# Patient Record
Sex: Male | Born: 1935 | Race: White | Hispanic: No | Marital: Married | Smoking: Former smoker
Health system: Southern US, Community
[De-identification: ages and names within clinical notes are randomized; demographics above are authoritative.]

## PROBLEM LIST (undated history)

## (undated) DIAGNOSIS — G629 Polyneuropathy, unspecified: Secondary | ICD-10-CM

## (undated) DIAGNOSIS — C679 Malignant neoplasm of bladder, unspecified: Secondary | ICD-10-CM

## (undated) DIAGNOSIS — I739 Peripheral vascular disease, unspecified: Secondary | ICD-10-CM

## (undated) DIAGNOSIS — J45909 Unspecified asthma, uncomplicated: Secondary | ICD-10-CM

## (undated) DIAGNOSIS — K449 Diaphragmatic hernia without obstruction or gangrene: Secondary | ICD-10-CM

## (undated) DIAGNOSIS — M25561 Pain in right knee: Secondary | ICD-10-CM

## (undated) DIAGNOSIS — I779 Disorder of arteries and arterioles, unspecified: Secondary | ICD-10-CM

## (undated) DIAGNOSIS — N183 Chronic kidney disease, stage 3 unspecified: Secondary | ICD-10-CM

## (undated) DIAGNOSIS — E663 Overweight: Secondary | ICD-10-CM

## (undated) DIAGNOSIS — I714 Abdominal aortic aneurysm, without rupture, unspecified: Secondary | ICD-10-CM

## (undated) DIAGNOSIS — I10 Essential (primary) hypertension: Secondary | ICD-10-CM

## (undated) DIAGNOSIS — H9191 Unspecified hearing loss, right ear: Secondary | ICD-10-CM

## (undated) DIAGNOSIS — I2510 Atherosclerotic heart disease of native coronary artery without angina pectoris: Secondary | ICD-10-CM

## (undated) DIAGNOSIS — M1990 Unspecified osteoarthritis, unspecified site: Secondary | ICD-10-CM

## (undated) DIAGNOSIS — J449 Chronic obstructive pulmonary disease, unspecified: Secondary | ICD-10-CM

## (undated) DIAGNOSIS — E785 Hyperlipidemia, unspecified: Secondary | ICD-10-CM

## (undated) DIAGNOSIS — Z9989 Dependence on other enabling machines and devices: Principal | ICD-10-CM

## (undated) DIAGNOSIS — N400 Enlarged prostate without lower urinary tract symptoms: Secondary | ICD-10-CM

## (undated) DIAGNOSIS — R609 Edema, unspecified: Secondary | ICD-10-CM

## (undated) DIAGNOSIS — G4733 Obstructive sleep apnea (adult) (pediatric): Secondary | ICD-10-CM

## (undated) DIAGNOSIS — K219 Gastro-esophageal reflux disease without esophagitis: Secondary | ICD-10-CM

## (undated) DIAGNOSIS — I4821 Permanent atrial fibrillation: Secondary | ICD-10-CM

## (undated) HISTORY — PX: JOINT REPLACEMENT: SHX530

## (undated) HISTORY — DX: Obstructive sleep apnea (adult) (pediatric): G47.33

## (undated) HISTORY — DX: Overweight: E66.3

## (undated) HISTORY — PX: COLONOSCOPY: SHX5424

## (undated) HISTORY — DX: Dependence on other enabling machines and devices: Z99.89

## (undated) HISTORY — DX: Hyperlipidemia, unspecified: E78.5

## (undated) HISTORY — DX: Essential (primary) hypertension: I10

## (undated) HISTORY — DX: Atherosclerotic heart disease of native coronary artery without angina pectoris: I25.10

## (undated) HISTORY — DX: Gastro-esophageal reflux disease without esophagitis: K21.9

## (undated) HISTORY — PX: BACK SURGERY: SHX140

## (undated) HISTORY — DX: Unspecified osteoarthritis, unspecified site: M19.90

---

## 2004-12-31 ENCOUNTER — Ambulatory Visit: Payer: Self-pay | Admitting: Internal Medicine

## 2006-11-09 ENCOUNTER — Encounter: Payer: Self-pay | Admitting: Cardiovascular Disease

## 2008-04-27 ENCOUNTER — Ambulatory Visit: Payer: Self-pay | Admitting: Cardiovascular Disease

## 2008-12-04 DIAGNOSIS — E785 Hyperlipidemia, unspecified: Secondary | ICD-10-CM | POA: Insufficient documentation

## 2008-12-04 DIAGNOSIS — I10 Essential (primary) hypertension: Secondary | ICD-10-CM | POA: Insufficient documentation

## 2008-12-04 DIAGNOSIS — E663 Overweight: Secondary | ICD-10-CM

## 2008-12-04 DIAGNOSIS — M1990 Unspecified osteoarthritis, unspecified site: Secondary | ICD-10-CM | POA: Insufficient documentation

## 2008-12-04 DIAGNOSIS — R002 Palpitations: Secondary | ICD-10-CM

## 2008-12-04 DIAGNOSIS — K219 Gastro-esophageal reflux disease without esophagitis: Secondary | ICD-10-CM

## 2009-05-04 ENCOUNTER — Ambulatory Visit: Payer: Self-pay

## 2009-05-04 ENCOUNTER — Encounter: Payer: Self-pay | Admitting: Cardiovascular Disease

## 2009-06-15 ENCOUNTER — Ambulatory Visit: Payer: Self-pay | Admitting: Cardiovascular Disease

## 2009-06-15 DIAGNOSIS — I5042 Chronic combined systolic (congestive) and diastolic (congestive) heart failure: Secondary | ICD-10-CM

## 2010-04-27 ENCOUNTER — Inpatient Hospital Stay (HOSPITAL_COMMUNITY): Admission: RE | Admit: 2010-04-27 | Discharge: 2010-04-29 | Payer: Self-pay | Admitting: Neurosurgery

## 2010-09-07 LAB — CBC
HCT: 41.6 % (ref 39.0–52.0)
MCV: 91.2 fL (ref 78.0–100.0)
Platelets: 232 10*3/uL (ref 150–400)

## 2010-09-07 LAB — BASIC METABOLIC PANEL
BUN: 14 mg/dL (ref 6–23)
CO2: 26 mEq/L (ref 19–32)
Chloride: 107 mEq/L (ref 96–112)
Glucose, Bld: 95 mg/dL (ref 70–99)
Potassium: 5 mEq/L (ref 3.5–5.1)

## 2010-09-07 LAB — SURGICAL PCR SCREEN
MRSA, PCR: NEGATIVE
Staphylococcus aureus: NEGATIVE

## 2010-11-08 NOTE — Assessment & Plan Note (Signed)
[facility] OFFICE NOTE   JAQUANE, BOUGHNER                       MRN:          [id number]  DATE:04/27/2008                            DOB:          11-05-1935    PRIMARY CARE PHYSICIAN:  Gelene Mink, MD   HISTORY OF PRESENT ILLNESS:  Mr. Willeen Cass is a pleasant 75 year old  Caucasian male with past medical history significant for hyperlipidemia,  hypertension, BPH, GERD, and arthritis who is referred to our office  today by Dr. Timoteo Gaul for further evaluation of an abnormal  echocardiogram.  The patient complained to Dr. Timoteo Gaul of occasional  shortness of breath.  Given the patient's risk factors for cardiac  disease, he was sent for an echocardiogram, which showed normal left  ventricular systolic function with mild LVH and mild diastolic  dysfunction.  Otherwise, the left atrium was mildly dilated.  There was  trace mitral regurgitation.  The aortic valve was calcified, but had no  evidence of stenosis.  There were no other significant valvular  abnormalities noted.  The patient tells me that he is limited to one he  can do secondary to arthritis and his back and knees.  He has been  participating in water aerobics and has had no episodes of chest pain,  shortness of breath, palpitations, or dizziness with exercise.  He tells  me that if he drinks caffeine, he is aware of his heart racing.  Otherwise, he denies any dizziness, near syncope, syncope, orthopnea,  PND, or change in very mild dependent lower extremity edema that has  occurred during the day for several years.   PAST MEDICAL HISTORY:  1. Hypertension.  2. Hyperlipidemia.  3. Obesity.  4. BPH.  5. GERD.  6. Osteoarthritis.  7. Chronic back pain.   PAST SURGICAL HISTORY:  None.   ALLERGIES:  No known drug allergies.   CURRENT MEDICATIONS:  1. Lotrel 5/10 mg once daily.  2. Cardura 4 mg once daily.  3. Mobic 7.5 mg twice daily.  4. Ultram  twice daily.  5. Finasteride 5 mg once daily.  6. Tylenol arthritis twice daily.  7. Omega 3 fish oil once daily.  8. Juice Plus as directed.   SOCIAL HISTORY:  The patient smoked 1 pack of cigarettes per day for 15  years but quit smoking 30 years ago.  He denies use of alcohol or  illicit drugs.  He is married and has 2 children.  He is retired.   FAMILY HISTORY:  There is no family history of premature coronary artery  disease.  The patient's mother died at age of 32 from breast cancer.  His father was an alcoholic and committed suicide at age of 7.  He has  one brother who died from esophageal cancer and another brother who died  from complications of emphysema.   REVIEW OF SYSTEMS:  As stated in the history of present illness and is  otherwise negative.   PHYSICAL EXAMINATION:  VITALS:  Blood pressure 162/92, pulse 82,  respiratory rate 12.  GENERAL:  He is in obese elderly Caucasian  male in no acute distress.  He is alert and oriented x3.  PSYCHIATRIC:  Mood and affect are appropriate.  NEUROLOGICAL:  No focal neurological deficits.  HEENT:  Normal.  MUSCULOSKELETAL:  Muscle strength and tone is normal.  SKIN:  Warm and dry.  No rashes noted.  NECK:  No JVD.  No carotid bruits.  No thyromegaly.  No lymphadenopathy.  LUNGS:  Clear to auscultation bilaterally without wheezes, rhonchi, or  crackles noted.  CARDIOVASCULAR:  Regular rate and rhythm with occasional ectopy.  No  murmurs, gallops, or rubs are noted.  ABDOMEN:  Soft, obese.  Bowel sounds are present.  EXTREMITIES:  There is trace bilateral lower extremity edema.  All of  the extremities are warm to touch.  Pulses are 2+ in the bilateral  radial arteries.  Pulses are 1+ in the bilateral dorsalis pedis and  posterior tibial arteries.   DIAGNOSTIC STUDIES:  1. Surface echocardiogram performed on March 16, 2008, showed      normal left regular systolic function with an ejection fraction of      55%.  There  was mild LVH with mild diastolic dysfunction.  The left      atrium was mildly dilated.  The aortic valve was mildly calcified.      There was no evidence of aortic stenosis or aortic regurgitation.      There was trace mitral regurgitation.  2. A 12-lead EKG obtained in our office today shows normal sinus      rhythm with premature atrial contractions with Aberrant conduction.      There are no ischemic changes noted on this EKG.  The ventricular      rate is 83 beats per minute.   ASSESSMENT AND PLAN:  Mr. Willeen Cass is a pleasant 75 year old Caucasian  male with a past medical history significant for hypertension,  hyperlipidemia, BPH, arthritis and obesity with no family history of  premature coronary artery disease and only a remote history of tobacco  abuse who presents today for further evaluation after an abnormal  echocardiogram.  Based on his examination today, I obtained a 12-lead  EKG to rule out atrial fibrillation as the source of his awareness of  palpitations.  It appears that he mostly has premature atrial  contractions with underlying sinus rhythm.  His echocardiogram is  suggestive of longstanding hypertension with evidence of left  ventricular hypertrophy and diastolic dysfunction.  Currently, he seems  to be well compensated from fluid standpoint.  I think the most  appropriate thing for him over the ensuing years will be adequate blood  pressure control.  If the patient does began to develop symptoms of  shortness of breath or pulmonary edema then it would most likely be  related to his diastolic dysfunction.  He does not describe to me today  any thing that sounds like angina or congestive heart failure.  He  remains very active and tells me that he actually exercised for 1 hour  in the water aerobics yesterday without any symptoms of chest pain or  shortness of breath.  I have discussed with him that there is a chance  given his age, obesity, hypertension, and  hyperlipidemia that he may  have mild underlying coronary artery disease.  We have agreed that we  will not proceed with any further workup for coronary artery disease at  the current time.  If the patient should develop chest pain or dyspnea  with minimal exertion then we will proceed with  stress test at that time  only.  I would like to repeat an echocardiogram, however, in about 12  months and see him back in my office after that echocardiogram.  I have  encouraged him to continue to follow along with Dr. Timoteo Gaul for his  primary care needs.  The patient is aware that he should alert [place] should  he have any change in his clinical status over the next 12 months.  I  have also stressed to him the importance of weight loss, diet and  exercise.     Verne Carrow, MD  Electronically Signed    CM/MedQ  DD: 04/27/2008  DT: 04/28/2008  Job #: [id number]   cc:   Gelene Mink, MD

## 2010-12-15 ENCOUNTER — Encounter: Payer: Self-pay | Admitting: Cardiovascular Disease

## 2011-03-21 ENCOUNTER — Ambulatory Visit: Payer: Self-pay | Admitting: Neurosurgery

## 2012-05-10 ENCOUNTER — Other Ambulatory Visit: Payer: Self-pay | Admitting: Orthopedic Surgery

## 2012-05-13 ENCOUNTER — Encounter (HOSPITAL_COMMUNITY): Payer: Self-pay | Admitting: Pharmacy Technician

## 2012-05-21 ENCOUNTER — Encounter (HOSPITAL_COMMUNITY)
Admission: RE | Admit: 2012-05-21 | Discharge: 2012-05-21 | Disposition: A | Discharge status: Home / Self Care | Payer: Medicare Other | Source: Ambulatory Visit | Attending: Orthopedic Surgery | Admitting: Orthopedic Surgery

## 2012-05-21 ENCOUNTER — Encounter (HOSPITAL_COMMUNITY): Payer: Self-pay

## 2012-05-21 ENCOUNTER — Encounter (HOSPITAL_COMMUNITY): Payer: Self-pay | Admitting: Vascular Surgery

## 2012-05-21 HISTORY — DX: Unspecified asthma, uncomplicated: J45.909

## 2012-05-21 LAB — CBC WITH DIFFERENTIAL/PLATELET
Basophils Relative: 1 % (ref 0–1)
HCT: 41.1 % (ref 39.0–52.0)
Hemoglobin: 13.2 g/dL (ref 13.0–17.0)
Lymphs Abs: 1.9 10*3/uL (ref 0.7–4.0)
MCHC: 32.1 g/dL (ref 30.0–36.0)
Monocytes Absolute: 0.8 10*3/uL (ref 0.1–1.0)
Monocytes Relative: 10 % (ref 3–12)
Neutro Abs: 4.9 10*3/uL (ref 1.7–7.7)
RBC: 4.59 MIL/uL (ref 4.22–5.81)

## 2012-05-21 LAB — BASIC METABOLIC PANEL
BUN: 18 mg/dL (ref 6–23)
Chloride: 104 mEq/L (ref 96–112)
GFR calc Af Amer: >90 mL/min (ref >90)
Glucose, Bld: 98 mg/dL (ref 70–99)
Potassium: 4.5 mEq/L (ref 3.5–5.1)
Sodium: 136 mEq/L (ref 135–145)

## 2012-05-21 LAB — SURGICAL PCR SCREEN: MRSA, PCR: NEGATIVE

## 2012-05-21 LAB — URINALYSIS, ROUTINE W REFLEX MICROSCOPIC
Glucose, UA: NEGATIVE mg/dL
Ketones, ur: NEGATIVE mg/dL
Leukocytes, UA: NEGATIVE
Nitrite: NEGATIVE
Protein, ur: NEGATIVE mg/dL
Urobilinogen, UA: 1 mg/dL (ref 0.0–1.0)

## 2012-05-21 LAB — TYPE AND SCREEN
ABO/RH(D): O POS
Antibody Screen: NEGATIVE

## 2012-05-21 LAB — PROTIME-INR: INR: 1.04 (ref 0.00–1.49)

## 2012-05-21 MED ORDER — CHLORHEXIDINE GLUCONATE 4 % EX LIQD: 60.0000 mL | Freq: Once | CUTANEOUS | Status: DC

## 2012-05-21 NOTE — Pre-Procedure Instructions (Signed)
20 John Stark  05/21/2012   Your procedure is scheduled on:  Monday May 27, 2012  Report to [facility] at 10:15 AM.  Call this number if you have problems the morning of surgery: [phone]   Remember:   Do not eat food or drink After Midnight.    Take these medicines the morning of surgery with A SIP OF WATER: amlodipine, cardura, finasteride, hydrocodone, claritin ( if needed), tramadol (if needed for pain)   Do not wear jewelry, make-up or nail polish.  Do not wear lotions, powders, or perfumes.   Do not shave 48 hours prior to surgery. Men may shave face and neck.  Do not bring valuables to the hospital.  Contacts, dentures or bridgework may not be worn into surgery.  Leave suitcase in the car. After surgery it may be brought to your room.  For patients admitted to the hospital, checkout time is 11:00 AM the day of discharge.   Patients discharged the day of surgery will not be allowed to drive home.  Name and phone number of your driver: family / friend  Special Instructions: Shower using CHG 2 nights before surgery and the night before surgery.  If you shower the day of surgery use CHG.  Use special wash - you have one bottle of CHG for all showers.  You should use approximately 1/3 of the bottle for each shower.   Please read over the following fact sheets that you were given: Pain Booklet, Coughing and Deep Breathing, Blood Transfusion Information, Total Joint Packet, MRSA Information and Surgical Site Infection Prevention

## 2012-05-21 NOTE — Progress Notes (Signed)
Forwarded  Abnormal EKG to anesthesia also requested anesthesia review Dr. Gibson Ramp note in epic.

## 2012-05-21 NOTE — Progress Notes (Signed)
05/21/12 1412  OBSTRUCTIVE SLEEP APNEA  Have you ever been diagnosed with sleep apnea through a sleep study? No  Do you snore loudly (loud enough to be heard through closed doors)?  1  Do you often feel tired, fatigued, or sleepy during the daytime? 0  Has anyone observed you stop breathing during your sleep? 0  Do you have, or are you being treated for high blood pressure? 1  BMI more than 35 kg/m2? 1  Age over 74 years old? 1  Neck circumference greater than 40 cm/18 inches? 1 (21 inches)  Gender: 1  Obstructive Sleep Apnea Score 6   Score 4 or greater  Results sent to PCP

## 2012-05-21 NOTE — Progress Notes (Signed)
This patient has screened at an elevated risk for obstructive sleep apnea using the STOP Bang tool during a pre-surgical visit. A score of 4 or greater is an elevated risk. 

## 2012-05-21 NOTE — Consult Note (Signed)
Anesthesia Consult:  Patient is a 76 year old male scheduled for a right TKA on 05/27/12 by Dr. Turner Daniels.  History includes HTN, asthma, "palpitations", murmur, OA, HLD, GERD, morbid obesity (BMI 40), chronic diastolic CHF.  EKG today indicated new onset afib with a rate up to 119 bpm.  (He did report a hospitalization > 20 years ago for tachycardia, possible irregular rhythm.  He was told it was due to too much caffeine, so he eliminated it from his diet and did not have any known recurrence.  PCP is Dr. Cammie Mcgee in [facility]).  He has been evaluated by Cardiologist Dr. Clifton James in the past, but not within the past year.  I evaluated patient during his PAT visit.  He was not in any acute distress.  He denied chest pain, SOB, palpitations, syncope/presyncope.  He felt at his baseline.  He does have chronic LE edema which he feels is stable.  His does have increased DOE when he is having knee pain.  Exam show a pleasant white male, in no acute distress.  Heart is irregularly irregular.  He had a faint expiratory wheeze in his left lung base posteriorly that cleared.  1+ pretibial edema.  No carotid bruits noted.  Echo on 05/04/09 showed: Left ventricle: The cavity size was normal. There was moderate concentric hypertrophy. Systolic function was normal. The estimated ejection fraction was in the range of 55% to 60%. Regional wall motion abnormalities cannot be excluded. Doppler parameters are consistent with abnormal left ventricular relaxation (grade 1 diastolic dysfunction).   CXR on 05/21/12 showed no active disease. Stable calcified granulomas bilaterally.  Labs noted.  I spoke with Dr. Clifton James about patient's EKG.  Since patient is currently asymptomatic, he will plan to see him in the office on 05/22/12 @ 1130 for further evaluation for afib and planned surgery (patient given details).  Patient notified to contact EMS if he develops chest pain, acute SOB, syncope/presyncope in the meantime.   Agustin Cree at Dr. Wadie Lessen office notified.  Shonna Chock, PA-C 05/21/12 1600

## 2012-05-21 NOTE — H&P (Signed)
TOTAL KNEE ADMISSION H&P  Patient is being admitted for right total knee arthroplasty.  Subjective:  Chief Complaint:right knee pain.  HPI: John Stark, 76 y.o. male, has a history of pain and functional disability in the right knee due to arthritis and has failed non-surgical conservative treatments for greater than 12 weeks to includeNSAID's and/or analgesics, corticosteriod injections, viscosupplementation injections and activity modification.  Onset of symptoms was gradual, starting 1 years ago with gradually worsening course since that time. The patient noted prior procedures on the knee to include  arthroscopy on the right knee(s).  Patient currently rates pain in the right knee(s) at 7 out of 10 with activity. Patient has joint swelling.  Patient has evidence of subchondral cysts, periarticular osteophytes and joint space narrowing by imaging studies. There is no active infection.  Patient Active Problem List   Diagnosis Date Noted  . DIASTOLIC HEART FAILURE, CHRONIC 06/15/2009  . HYPERLIPIDEMIA-MIXED 12/04/2008  . OVERWEIGHT/OBESITY 12/04/2008  . HYPERTENSION, UNSPECIFIED 12/04/2008  . GERD 12/04/2008  . OSTEOARTHRITIS 12/04/2008  . PALPITATIONS 12/04/2008   Past Medical History  Diagnosis Date  . Palpitations   . Overweight   . Unspecified essential hypertension   . Other and unspecified hyperlipidemia   . Esophageal reflux   . Osteoarthrosis, unspecified whether generalized or localized, unspecified site   . Diastolic dysfunction   . Heart murmur   . Dysrhythmia     Dr.  Richardine Service,  sees 1x year, saw last 2012  . Asthma     "as a child"    Past Surgical History  Procedure Date  . Back surgery     2011    No prescriptions prior to admission   No Known Allergies  History  Substance Use Topics  . Smoking status: Former Games developer  . Smokeless tobacco: Not on file     Comment: Quit 1978  . Alcohol Use: No    Family History  Problem Relation Age of Onset  .  Cancer Other      Review of Systems  Constitutional: Negative.   HENT: Negative.   Eyes: Negative.   Respiratory: Negative.   Cardiovascular: Negative.   Gastrointestinal: Negative.   Genitourinary: Negative.   Musculoskeletal: Positive for joint pain.  Skin: Negative.   Neurological: Negative.   Endo/Heme/Allergies: Negative.   Psychiatric/Behavioral: Negative.     Objective:  Physical Exam  Constitutional: He is oriented to person, place, and time. He appears well-developed and well-nourished.  HENT:  Head: Normocephalic and atraumatic.  Eyes: Pupils are equal, round, and reactive to light.  Cardiovascular: Normal heart sounds.   Respiratory: Breath sounds normal.  GI: Soft.  Musculoskeletal:       Right shoulder: He exhibits decreased range of motion, tenderness, effusion and crepitus.  Neurological: He is alert and oriented to person, place, and time.  Psychiatric: He has a normal mood and affect.    Vital signs in last 24 hours: Temp:  [97.5 F (36.4 C)] 97.5 F (36.4 C) (11/26 1406) Pulse Rate:  [96] 96  (11/26 1406) Resp:  [20] 20  (11/26 1406) BP: (164)/(93) 164/93 mmHg (11/26 1406) SpO2:  [94 %] 94 % (11/26 1406) Weight:  [130.182 kg (287 lb)] 130.182 kg (287 lb) (11/26 1406)  Labs:   Estimated Body mass index is 39.92 kg/(m^2) as calculated from the following:   Height as of 06/15/09: 5\' 11" (1.803 m).   Weight as of 06/15/09: 286 lb 4 oz(129.842 kg).   Imaging Review Plain radiographs demonstrate severe  degenerative joint disease of the right knee(s). The overall alignment ismild varus. The bone quality appears to be adequate for age and reported activity level.  Assessment/Plan:  End stage arthritis, right knee   The patient history, physical examination, clinical judgment of the provider and imaging studies are consistent with end stage degenerative joint disease of the right knee(s) and total knee arthroplasty is deemed medically necessary. The  treatment options including medical management, injection therapy arthroscopy and arthroplasty were discussed at length. The risks and benefits of total knee arthroplasty were presented and reviewed. The risks due to aseptic loosening, infection, stiffness, patella tracking problems, thromboembolic complications and other imponderables were discussed. The patient acknowledged the explanation, agreed to proceed with the plan and consent was signed. Patient is being admitted for inpatient treatment for surgery, pain control, PT, OT, prophylactic antibiotics, VTE prophylaxis, progressive ambulation and ADL's and discharge planning. The patient is planning to be discharged home with home health services

## 2012-05-22 ENCOUNTER — Encounter: Payer: Self-pay | Admitting: Cardiovascular Disease

## 2012-05-22 ENCOUNTER — Ambulatory Visit (INDEPENDENT_AMBULATORY_CARE_PROVIDER_SITE_OTHER): Payer: Medicare Other | Admitting: Cardiovascular Disease

## 2012-05-22 VITALS — BP 136/86 | HR 147 | Wt 288.0 lb

## 2012-05-22 DIAGNOSIS — I4891 Unspecified atrial fibrillation: Secondary | ICD-10-CM

## 2012-05-22 MED ORDER — METOPROLOL TARTRATE 50 MG PO TABS: 50.0000 mg | ORAL_TABLET | Freq: Two times a day (BID) | ORAL | Status: DC

## 2012-05-22 MED ORDER — RIVAROXABAN 20 MG PO TABS: 20.0000 mg | ORAL_TABLET | Freq: Every day | ORAL | Status: DC

## 2012-05-22 MED ORDER — FAMOTIDINE 20 MG PO TABS: 20.0000 mg | ORAL_TABLET | Freq: Every day | ORAL | Status: DC

## 2012-05-22 NOTE — Patient Instructions (Addendum)
Your physician recommends that you schedule a follow-up appointment in:  2-3 weeks with NP or PA.  Your physician has requested that you have an echocardiogram. Echocardiography is a painless test that uses sound waves to create images of your heart. It provides your doctor with information about the size and shape of your heart and how well your heart's chambers and valves are working. This procedure takes approximately one hour. There are no restrictions for this procedure.  Your physician has recommended you make the following change in your medication: Start Xarelto 20 mg by mouth daily. Start Lopressor 50 mg by mouth twice daily. Start Pepcid 20 mg by mouth daily.

## 2012-05-22 NOTE — Progress Notes (Signed)
History of Present Illness: [name] is a pleasant 76 year old Caucasian male with past medical history significant for hyperlipidemia, hypertension, BPH, GERD, and arthritis who was seen as a new patient in our office in 2010 for evaluation of an abnormal echo. HIs echo showed normal left ventricular systolic function with mild LVH and mild diastolic dysfunction. Otherwise, the left atrium was mildly dilated. There was trace mitral regurgitation. The aortic valve was calcified, but had no evidence of stenosis. There were no other significant valvular abnormalities noted. He has not been seen in our office since 06/15/2009. He has been planning a knee surgery and was in pre-op planning yesterday at [facility] when his EKG showed atrial fibrillation with HR of 119 bpm. He has not had documented atrial fibrillation in the past.   He is here today for evaluation of his abnormal EKG. He has had no episodes of chest pain, shortness of breath, palpitations, or dizziness with exercise. Otherwise, he denies any dizziness, near syncope, syncope, orthopnea, PND,  Primary Care Physician: Colin Mulders   Past Medical History  Diagnosis Date  . Palpitations   . Overweight   . Unspecified essential hypertension   . Other and unspecified hyperlipidemia   . Esophageal reflux   . Osteoarthrosis, unspecified whether generalized or localized, unspecified site   . Diastolic dysfunction   . Heart murmur   . Asthma     "as a child"    Past Surgical History  Procedure Date  . Back surgery     2011    Current Outpatient Prescriptions  Medication Sig Dispense Refill  . amLODipine (NORVASC) 5 MG tablet Take 5 mg by mouth daily.      . benazepril (LOTENSIN) 10 MG tablet Take 10 mg by mouth daily.      Marland Kitchen doxazosin (CARDURA) 4 MG tablet Take 4 mg by mouth daily.        . finasteride (PROSCAR) 5 MG tablet Take 5 mg by mouth daily.        . furosemide (LASIX) 20 MG tablet Take 20 mg by mouth daily.        Marland Kitchen  HYDROcodone-acetaminophen (NORCO/VICODIN) 5-325 MG per tablet Take 1 tablet by mouth 2 (two) times daily as needed. For pain      . loratadine (CLARITIN) 10 MG tablet Take 10 mg by mouth daily as needed. For allergies      . meloxicam (MOBIC) 7.5 MG tablet Take 7.5 mg by mouth 2 (two) times daily as needed. For pain      . Nutritional Supplements (JUICE PLUS FIBRE PO) Take 6 tablets by mouth daily. 2 berry tablets, 2 fruit tablets, and 2 veggie tablets      . potassium chloride (MICRO-K) 10 MEQ CR capsule Take 10 mEq by mouth daily.        . simvastatin (ZOCOR) 10 MG tablet Take 10 mg by mouth at bedtime.      . traMADol (ULTRAM) 50 MG tablet Take 50 mg by mouth every 6 (six) hours as needed. For pain       No current facility-administered medications for this visit.   Facility-Administered Medications Ordered in Other Visits  Medication Dose Route Frequency Provider Last Rate Last Dose  . [DISCONTINUED] chlorhexidine (HIBICLENS) 4 % liquid 4 application  60 mL Topical Once Nestor Lewandowsky, MD        No Known Allergies  History   Social History  . Marital Status: Married    Spouse Name: N/A  Number of Children: 2  . Years of Education: N/A   Occupational History  . Retired-purchasing Production designer, theatre/television/film [employer]    Social History Main Topics  . Smoking status: Former Smoker -- 1.0 packs/day for 20 years    Types: Cigarettes    Quit date: 05/22/1977  . Smokeless tobacco: Not on file     Comment: Quit 1978  . Alcohol Use: No  . Drug Use: No  . Sexually Active: Not on file   Other Topics Concern  . Not on file   Social History Narrative   Retired. Married. Regularly exercises.     Family History  Problem Relation Age of Onset  . Cancer Other   . Heart attack Brother 75    Review of Systems:  As stated in the HPI and otherwise negative.   BP 136/86  Pulse 147  Wt 288 lb (130.636 kg)  Physical Examination: General: Well developed, well nourished, NAD HEENT: OP clear, mucus  membranes moist SKIN: warm, dry. No rashes. Neuro: No focal deficits Musculoskeletal: Muscle strength 5/5 all ext Psychiatric: Mood and affect normal Neck: No JVD, no carotid bruits, no thyromegaly, no lymphadenopathy. Lungs:Clear bilaterally, no wheezes, rhonci, crackles Cardiovascular: Irregular  rate and rhythm. No murmurs, gallops or rubs. Abdomen:Soft. Bowel sounds present. Non-tender.  Extremities: No lower extremity edema. Pulses are 2 + in the bilateral DP/PT.  EKG: Atrial fibrillation, rate 140bpm  Assessment and Plan:   1. Atrial fibrillation: New onset. His rate is elevated but he feels well. No dizziness, near syncope or syncope. He is not aware of any palpitations. I have explained in detail the etiology of atrial fibrillation and gone over normal cardiac anatomy. I have explained his risk of stroke. Will start Xarelto 20 mg po Qdaily. He will be given samples today for Xarelto. CBC and BMET checked yesterday and WNL. Will check TSH at next visit. He just had blood drawn yesterday. Will arrange echo to assess LV size and function, atrial size and exclude progression of valvular disease. Will start Lopressor 50 mg po BID for rate control.Will arrange f/u in 2 weeks. He will need one month of anti-coagulation with Xarelto and if he remains in atrial fibrillation, he will need consideration for cardioversion. Will also start Pepcid 20 mg po Qdaily with increased risk of GI issues on Mobic and now start Xarelto.   Will ask him to postpone his planned knee surgery. Will route this note to Dr. Turner Daniels.

## 2012-05-27 ENCOUNTER — Encounter (HOSPITAL_COMMUNITY): Admission: RE | Payer: Self-pay | Source: Ambulatory Visit

## 2012-05-27 ENCOUNTER — Inpatient Hospital Stay (HOSPITAL_COMMUNITY): Admission: RE | Admit: 2012-05-27 | Payer: Medicare Other | Source: Ambulatory Visit | Admitting: Orthopedic Surgery

## 2012-05-27 ENCOUNTER — Ambulatory Visit (HOSPITAL_COMMUNITY): Payer: Medicare Other | Attending: Cardiology | Admitting: Radiology

## 2012-05-27 ENCOUNTER — Other Ambulatory Visit (HOSPITAL_COMMUNITY): Payer: Self-pay | Admitting: Radiology

## 2012-05-27 DIAGNOSIS — I10 Essential (primary) hypertension: Secondary | ICD-10-CM | POA: Insufficient documentation

## 2012-05-27 DIAGNOSIS — E785 Hyperlipidemia, unspecified: Secondary | ICD-10-CM | POA: Insufficient documentation

## 2012-05-27 DIAGNOSIS — I517 Cardiomegaly: Secondary | ICD-10-CM | POA: Insufficient documentation

## 2012-05-27 DIAGNOSIS — I4891 Unspecified atrial fibrillation: Secondary | ICD-10-CM

## 2012-05-27 DIAGNOSIS — E669 Obesity, unspecified: Secondary | ICD-10-CM | POA: Insufficient documentation

## 2012-05-27 SURGERY — ARTHROPLASTY, KNEE, TOTAL
Anesthesia: Choice | Laterality: Right

## 2012-05-27 MED ORDER — PERFLUTREN PROTEIN A MICROSPH IV SUSP
3.0000 mL | Freq: Once | INTRAVENOUS | Status: AC
  Administered 2012-05-27: 3 mL via INTRAVENOUS

## 2012-05-27 MED ORDER — PERFLUTREN PROTEIN A MICROSPH IV SUSP: 3.0000 mL | Freq: Once | INTRAVENOUS | Status: DC

## 2012-05-27 NOTE — Progress Notes (Signed)
Echocardiogram performed.  

## 2012-06-05 ENCOUNTER — Telehealth: Payer: Self-pay | Admitting: *Deleted

## 2012-06-05 ENCOUNTER — Encounter: Payer: Self-pay | Admitting: Physician Assistant

## 2012-06-05 ENCOUNTER — Ambulatory Visit (INDEPENDENT_AMBULATORY_CARE_PROVIDER_SITE_OTHER): Payer: Medicare Other | Admitting: Physician Assistant

## 2012-06-05 VITALS — BP 137/87 | HR 92 | Ht 71.0 in | Wt 290.0 lb

## 2012-06-05 DIAGNOSIS — I4891 Unspecified atrial fibrillation: Secondary | ICD-10-CM

## 2012-06-05 DIAGNOSIS — I10 Essential (primary) hypertension: Secondary | ICD-10-CM

## 2012-06-05 NOTE — Telephone Encounter (Signed)
pt notified of normal tsh, verbalized understanding

## 2012-06-05 NOTE — Progress Notes (Signed)
[address], [address] [place], [place]  [address] Phone: [phone], Fax:  [phone]  Date:  06/05/2012   Name:  John Stark   DOB:  [birth year]   MRN:  [id number]  PCP:  Shirlean Kelly., MD  Primary Cardiologist:  Dr. Verne Carrow  Primary Electrophysiologist:  None    History of Present Illness: John Stark is a 76 y.o. male who returns for follow up on AFib.  He has a hx of HTN, HL, BPH, GERD, DJD.  EF by echo in 2010 was normal.  He was recently noted to be in AFib during pre-op workup for knee surgery and was seen by Dr. Verne Carrow.  He was started on Xarelto for anticoagulation.  Metoprolol 50 mg bid was started for rate control.  Echo 05/27/12:  Mild LVH, EF 55%, aortic sclerosis, no AS, mild LAE, mild RVE, moderate RV dysfunction, mild RAE, PASP 32.  Surgery was postponed.  He was placed on H2RA for GI prophylaxis with Mobic and Xarelto.  He is doing well.  The patient denies chest pain, syncope, orthopnea, PND or significant pedal edema.  He has chronic Class III DOE.  No changes.    Labs (11/13):   K 4.5, creatinine 0.98, Hgb 13.2 CXR (11/13):   No acute disease, stable bilateral granulomas   Wt Readings from Last 3 Encounters:  06/05/12 290 lb (131.543 kg)  05/22/12 288 lb (130.636 kg)  05/21/12 287 lb (130.182 kg)     Past Medical History  Diagnosis Date  . Palpitations   . Overweight   . Unspecified essential hypertension   . Other and unspecified hyperlipidemia   . Esophageal reflux   . Osteoarthrosis, unspecified whether generalized or localized, unspecified site   . Diastolic dysfunction   . Heart murmur   . Asthma     "as a child"    Current Outpatient Prescriptions  Medication Sig Dispense Refill  . amLODipine (NORVASC) 5 MG tablet Take 5 mg by mouth daily.      . benazepril (LOTENSIN) 10 MG tablet Take 10 mg by mouth daily.      Marland Kitchen doxazosin (CARDURA) 4 MG tablet Take 4 mg by mouth daily.        .  famotidine (PEPCID) 20 MG tablet Take 1 tablet (20 mg total) by mouth daily.  30 tablet  6  . finasteride (PROSCAR) 5 MG tablet Take 5 mg by mouth daily.        . furosemide (LASIX) 20 MG tablet Take 20 mg by mouth daily.        Marland Kitchen HYDROcodone-acetaminophen (NORCO/VICODIN) 5-325 MG per tablet Take 1 tablet by mouth 2 (two) times daily as needed. For pain      . loratadine (CLARITIN) 10 MG tablet Take 10 mg by mouth daily as needed. For allergies      . meloxicam (MOBIC) 7.5 MG tablet Take 7.5 mg by mouth 2 (two) times daily as needed. For pain      . metoprolol (LOPRESSOR) 50 MG tablet Take 1 tablet (50 mg total) by mouth 2 (two) times daily.  60 tablet  6  . Nutritional Supplements (JUICE PLUS FIBRE PO) Take 6 tablets by mouth daily. 2 berry tablets, 2 fruit tablets, and 2 veggie tablets      . potassium chloride (MICRO-K) 10 MEQ CR capsule Take 10 mEq by mouth daily.        . pravastatin (PRAVACHOL) 40 MG tablet       .  Rivaroxaban (XARELTO) 20 MG TABS Take 1 tablet (20 mg total) by mouth daily.  30 tablet  6  . simvastatin (ZOCOR) 10 MG tablet Take 10 mg by mouth at bedtime.      . traMADol (ULTRAM) 50 MG tablet Take 50 mg by mouth every 6 (six) hours as needed. For pain        Allergies:  No Known Allergies  Social History:  The patient  reports that he quit smoking about 35 years ago. His smoking use included Cigarettes. He has a 20 pack-year smoking history. He does not have any smokeless tobacco history on file. He reports that he does not drink alcohol or use illicit drugs.   ROS:  Please see the history of present illness.   He has a hx of snoring.  No witnessed apneic episodes or daytime somnolence.   All other systems reviewed and negative.   PHYSICAL EXAM: VS:  BP 137/87  Pulse 92  Ht 5\' 11"  (1.803 m)  Wt 290 lb (131.543 kg)  BMI 40.45 kg/m2 Well nourished, well developed, in no acute distress HEENT: normal Neck: no JVD at 90 degrees Cardiac:  normal S1, S2; irregularly  irregular rhythm; no murmur Lungs:  clear to auscultation bilaterally, no wheezing, rhonchi or rales Abd: soft, nontender, no hepatomegaly Ext: trace bilateral LE edema Skin: warm and dry Neuro:  CNs 2-12 intact, no focal abnormalities noted  EKG:  AFib, HR 77, NSSTTW changes       ASSESSMENT AND PLAN:  1. Atrial Fibrillation:   Rate better controlled.  His CHADS2 score is 1 but age is 71 and it will soon be 2.  He likely would benefit from long term anticoagulation.  Continue Xarelto.  Plan follow up with me 12/23.  If still in AFib, plan on DCCV 12/24.  We discussed the importance of not missing any doses of Xarelto.  Will give him samples again today.  Check TSH.  I have asked him to have his family monitor for apnea.  If present, plan sleep study.    2. Hypertension:   Controlled.  Continue current therapy.   3. DJD:   Surgery on hold for now.  4. Disposition:  Follow up with me in 2 weeks and plan DCCV is still in AFib.   Signed, Tereso Newcomer, PA-C  2:17 PM 06/05/2012

## 2012-06-05 NOTE — Telephone Encounter (Signed)
Message copied by Tarri Fuller on Wed Jun 05, 2012  5:28 PM ------      Message from: [place], [place] T      Created: Wed Jun 05, 2012  4:59 PM       Normal TSH      Tereso Newcomer, [place]  4:59 PM 06/05/2012

## 2012-06-05 NOTE — Patient Instructions (Addendum)
LAB TODAY; TSH  FOLLOW UP WITH SCOTT WEAVER, [facility] ON 06/17/12 @ 10:10 AM

## 2012-06-17 ENCOUNTER — Telehealth: Payer: Self-pay | Admitting: *Deleted

## 2012-06-17 ENCOUNTER — Encounter: Payer: Self-pay | Admitting: Physician Assistant

## 2012-06-17 ENCOUNTER — Encounter: Payer: Self-pay | Admitting: *Deleted

## 2012-06-17 ENCOUNTER — Ambulatory Visit (INDEPENDENT_AMBULATORY_CARE_PROVIDER_SITE_OTHER): Payer: Medicare Other | Admitting: Physician Assistant

## 2012-06-17 VITALS — BP 132/88 | HR 90 | Ht 71.0 in | Wt 290.8 lb

## 2012-06-17 DIAGNOSIS — I4891 Unspecified atrial fibrillation: Secondary | ICD-10-CM

## 2012-06-17 LAB — CBC WITH DIFFERENTIAL/PLATELET
Basophils Absolute: 0 10*3/uL (ref 0.0–0.1)
HCT: 39.4 % (ref 39.0–52.0)
Hemoglobin: 13.3 g/dL (ref 13.0–17.0)
Lymphs Abs: 1.7 10*3/uL (ref 0.7–4.0)
MCHC: 33.7 g/dL (ref 30.0–36.0)
MCV: 92 fl (ref 78.0–100.0)
Monocytes Absolute: 1 10*3/uL (ref 0.1–1.0)
Monocytes Relative: 11.7 % (ref 3.0–12.0)
Neutro Abs: 5.2 10*3/uL (ref 1.4–7.7)
Platelets: 162 10*3/uL (ref 150.0–400.0)
RDW: 14.7 % — ABNORMAL HIGH (ref 11.5–14.6)

## 2012-06-17 LAB — BASIC METABOLIC PANEL
CO2: 26 mEq/L (ref 19–32)
Calcium: 8.7 mg/dL (ref 8.4–10.5)
Creatinine, Ser: 1 mg/dL (ref 0.4–1.5)
GFR: 75.79 mL/min (ref >60.00)
Sodium: 140 mEq/L (ref 135–145)

## 2012-06-17 MED ORDER — DEXTROSE-NACL 5-0.45 % IV SOLN: INTRAVENOUS | Status: DC

## 2012-06-17 MED ORDER — DEXTROSE 5 % IV SOLN
3.0000 g | INTRAVENOUS | Status: DC
  Filled 2012-06-17: qty 3000

## 2012-06-17 NOTE — H&P (Signed)
History and Physical  Date:  06/17/2012   Name:  John Stark   DOB:  12/05/1935   MRN:  [id number]  PCP:  Benita Stabile, MD  Primary Cardiologist:  Dr. Verne Carrow  Primary Electrophysiologist:  None    History of Present Illness: John Stark is a 76 y.o. male who returns for follow up on AFib.  He has a hx of HTN, HL, BPH, GERD, DJD.  EF by echo in 2010 was normal.  He was recently noted to be in AFib during pre-op workup for knee surgery and was seen by Dr. Verne Carrow.  He was started on Xarelto for anticoagulation.  Metoprolol 50 mg bid was started for rate control.  Echo 05/27/12:  Mild LVH, EF 55%, aortic sclerosis, no AS, mild LAE, mild RVE, moderate RV dysfunction, mild RAE, PASP 32.  Surgery was postponed.  He was placed on H2RA for GI prophylaxis with Mobic and Xarelto.  I saw him in follow up 06/05/12.  We planned follow up today with an eye towards DCCV tomorrow if he remained in AFib.  He is doing well.  The patient denies chest pain, syncope, orthopnea, PND or significant pedal edema.  He has chronic Class III DOE.  No changes.    Labs (11/13):   K 4.5, creatinine 0.98, Hgb 13.2 Labs (12/13):   TSH 0.48 CXR (11/13):   No acute disease, stable bilateral granulomas   Wt Readings from Last 3 Encounters:  06/17/12 290 lb 12.8 oz (131.906 kg)  06/05/12 290 lb (131.543 kg)  05/22/12 288 lb (130.636 kg)     Past Medical History  Diagnosis Date  . Atrial fibrillation     a. Echo 05/27/12:  Mild LVH, EF 55%, aortic sclerosis, no AS, mild LAE, mild RVE, moderate RV dysfunction, mild RAE, PASP 32;  b. Xarelto started 05/22/12  . Overweight   . HTN (hypertension)   . HLD (hyperlipidemia)   . GERD (gastroesophageal reflux disease)   . Osteoarthrosis, unspecified whether generalized or localized, unspecified site   . Asthma     "as a child"    Current Outpatient Prescriptions  Medication Sig Dispense Refill  . amLODipine (NORVASC) 5 MG tablet  Take 5 mg by mouth daily.      . benazepril (LOTENSIN) 10 MG tablet Take 10 mg by mouth daily.      Marland Kitchen doxazosin (CARDURA) 4 MG tablet Take 4 mg by mouth daily.        . famotidine (PEPCID) 20 MG tablet Take 1 tablet (20 mg total) by mouth daily.  30 tablet  6  . finasteride (PROSCAR) 5 MG tablet Take 5 mg by mouth daily.        . furosemide (LASIX) 20 MG tablet Take 20 mg by mouth daily.        Marland Kitchen HYDROcodone-acetaminophen (NORCO/VICODIN) 5-325 MG per tablet Take 1 tablet by mouth 2 (two) times daily as needed. For pain      . loratadine (CLARITIN) 10 MG tablet Take 10 mg by mouth daily as needed. For allergies      . meloxicam (MOBIC) 7.5 MG tablet Take 7.5 mg by mouth 2 (two) times daily as needed. For pain      . metoprolol (LOPRESSOR) 50 MG tablet Take 1 tablet (50 mg total) by mouth 2 (two) times daily.  60 tablet  6  . Nutritional Supplements (JUICE PLUS FIBRE PO) Take 6 tablets by mouth daily. 2 berry tablets, 2 fruit tablets, and  2 veggie tablets      . potassium chloride (MICRO-K) 10 MEQ CR capsule Take 10 mEq by mouth daily.        . pravastatin (PRAVACHOL) 40 MG tablet Take 40 mg by mouth daily.       . Rivaroxaban (XARELTO) 20 MG TABS Take 1 tablet (20 mg total) by mouth daily.  30 tablet  6  . simvastatin (ZOCOR) 10 MG tablet Take 10 mg by mouth at bedtime.      . traMADol (ULTRAM) 50 MG tablet Take 50 mg by mouth every 6 (six) hours as needed. For pain        Allergies:  No Known Allergies  Social History:  The patient  reports that he quit smoking about 35 years ago. His smoking use included Cigarettes. He has a 20 pack-year smoking history. He does not have any smokeless tobacco history on file. He reports that he does not drink alcohol or use illicit drugs.   ROS:  Please see the history of present illness.   No melena, hematochezia, hematuria.  All other systems reviewed and negative.   PHYSICAL EXAM: VS:  BP 132/88  Pulse 90  Ht 5\' 11"  (1.803 m)  Wt 290 lb 12.8 oz  (131.906 kg)  BMI 40.56 kg/m2 Well nourished, well developed, in no acute distress HEENT: normal Neck: no JVD at 90 degrees Cardiac:  normal S1, S2; irregularly irregular rhythm; no murmur Lungs:  clear to auscultation bilaterally, no wheezing, rhonchi or rales Abd: soft, nontender, no hepatomegaly Ext: trace bilateral LE edema Skin: warm and dry Neuro:  CNs 2-12 intact, no focal abnormalities noted  EKG:  AFib, HR 92, NSSTTW changes       ASSESSMENT AND PLAN:  1. Atrial Fibrillation:   Rate controlled.  Plan is for DCCV.  Schedule for tomorrow.  Discussed with Dr. Peter [place]il[place] (DOD) who agreed to proceed.  Patient has not missed any doses of Xarelto.  We will provide more samples today.  CHADS2=1 (HTN) but age is 19 and it will soon be 2.  He likely would benefit from long term anticoagulation.  Will defer ultimate decision regarding long term anticoagulation to Dr. Verne Carrow.  2. Hypertension:   Controlled.  Continue current therapy.   3. DJD:   Surgery on hold for now.  4. Disposition:  DCCV tomorrow.  Follow up with Dr. Verne Carrow in 3-4 weeks.   Signed, Tereso Newcomer, PA-C  10:57 AM 06/17/2012

## 2012-06-17 NOTE — Telephone Encounter (Signed)
pt notified about labs, ok for DCCV tomorrow 06-18-12

## 2012-06-17 NOTE — Telephone Encounter (Signed)
Message copied by Tarri Fuller on Mon Jun 17, 2012  3:49 PM ------      Message from: [place], [place] T      Created: Mon Jun 17, 2012  2:08 PM       Labs ok for DCCV tomorrow.      Tereso Newcomer, PA-C  2:08 PM 06/17/2012

## 2012-06-17 NOTE — Patient Instructions (Addendum)
NEED APPOINTMENT WITH DR. Clifton James 3-4 WEEKS OR WITH SCOTT WEAVER ON THE DAY DR. Clifton James IS IN OFFICE.  Your physician has recommended that you have a Cardioversion (DCCV). Electrical Cardioversion uses a jolt of electricity to your heart either through paddles or wired patches attached to your chest. This is a controlled, usually prescheduled, procedure. Defibrillation is done under light anesthesia in the hospital, and you usually go home the day of the procedure. This is done to get your heart back into a normal rhythm. You are not awake for the procedure. Please see the instruction sheet given to you today.  GIVE SAMPLES TODAY FOR XARELTO  LABS DONE TODAY (CBC WITH DIFF AND BMP)

## 2012-06-17 NOTE — H&P (Signed)
Patient seen and examined and history reviewed. Agree with above findings and plan. Patient has persistent atrial fibrillation. He has been anticoagulated for 1 month and will undergo DCCV now.   Theron Arista [facility] 06/17/2012 5:21 PM

## 2012-06-17 NOTE — Progress Notes (Signed)
[address] [place], [place]  [address] Phone: [phone], Fax:  [phone]  Date:  06/17/2012   Name:  DEVERE BREM   DOB:  [birth year]   MRN:  [id number]  PCP:  Benita Stabile, MD  Primary Cardiologist:  Dr. Verne Carrow  Primary Electrophysiologist:  None    History of Present Illness: John Stark is a 76 y.o. male who returns for follow up on AFib.  He has a hx of HTN, HL, BPH, GERD, DJD.  EF by echo in 2010 was normal.  He was recently noted to be in AFib during pre-op workup for knee surgery and was seen by Dr. Verne Carrow.  He was started on Xarelto for anticoagulation.  Metoprolol 50 mg bid was started for rate control.  Echo 05/27/12:  Mild LVH, EF 55%, aortic sclerosis, no AS, mild LAE, mild RVE, moderate RV dysfunction, mild RAE, PASP 32.  Surgery was postponed.  He was placed on H2RA for GI prophylaxis with Mobic and Xarelto.  I saw him in follow up 06/05/12.  We planned follow up today with an eye towards DCCV tomorrow if he remained in AFib.  He is doing well.  The patient denies chest pain, syncope, orthopnea, PND or significant pedal edema.  He has chronic Class III DOE.  No changes.    Labs (11/13):   K 4.5, creatinine 0.98, Hgb 13.2 Labs (12/13):   TSH 0.48 CXR (11/13):   No acute disease, stable bilateral granulomas   Wt Readings from Last 3 Encounters:  06/17/12 290 lb 12.8 oz (131.906 kg)  06/05/12 290 lb (131.543 kg)  05/22/12 288 lb (130.636 kg)     Past Medical History  Diagnosis Date  . Atrial fibrillation     a. Echo 05/27/12:  Mild LVH, EF 55%, aortic sclerosis, no AS, mild LAE, mild RVE, moderate RV dysfunction, mild RAE, PASP 32;  b. Xarelto started 05/22/12  . Overweight   . HTN (hypertension)   . HLD (hyperlipidemia)   . GERD (gastroesophageal reflux disease)   . Osteoarthrosis, unspecified whether generalized or localized, unspecified site   . Asthma     "as a child"    Current  Outpatient Prescriptions  Medication Sig Dispense Refill  . amLODipine (NORVASC) 5 MG tablet Take 5 mg by mouth daily.      . benazepril (LOTENSIN) 10 MG tablet Take 10 mg by mouth daily.      Marland Kitchen doxazosin (CARDURA) 4 MG tablet Take 4 mg by mouth daily.        . famotidine (PEPCID) 20 MG tablet Take 1 tablet (20 mg total) by mouth daily.  30 tablet  6  . finasteride (PROSCAR) 5 MG tablet Take 5 mg by mouth daily.        . furosemide (LASIX) 20 MG tablet Take 20 mg by mouth daily.        Marland Kitchen HYDROcodone-acetaminophen (NORCO/VICODIN) 5-325 MG per tablet Take 1 tablet by mouth 2 (two) times daily as needed. For pain      . loratadine (CLARITIN) 10 MG tablet Take 10 mg by mouth daily as needed. For allergies      . meloxicam (MOBIC) 7.5 MG tablet Take 7.5 mg by mouth 2 (two) times daily as needed. For pain      . metoprolol (LOPRESSOR) 50 MG tablet Take 1 tablet (50 mg total) by mouth 2 (two) times daily.  60 tablet  6  . Nutritional Supplements (JUICE PLUS FIBRE  PO) Take 6 tablets by mouth daily. 2 berry tablets, 2 fruit tablets, and 2 veggie tablets      . potassium chloride (MICRO-K) 10 MEQ CR capsule Take 10 mEq by mouth daily.        . pravastatin (PRAVACHOL) 40 MG tablet Take 40 mg by mouth daily.       . Rivaroxaban (XARELTO) 20 MG TABS Take 1 tablet (20 mg total) by mouth daily.  30 tablet  6  . simvastatin (ZOCOR) 10 MG tablet Take 10 mg by mouth at bedtime.      . traMADol (ULTRAM) 50 MG tablet Take 50 mg by mouth every 6 (six) hours as needed. For pain        Allergies:  No Known Allergies  Social History:  The patient  reports that he quit smoking about 35 years ago. His smoking use included Cigarettes. He has a 20 pack-year smoking history. He does not have any smokeless tobacco history on file. He reports that he does not drink alcohol or use illicit drugs.   ROS:  Please see the history of present illness.   No melena, hematochezia, hematuria.  All other systems reviewed and negative.    PHYSICAL EXAM: VS:  BP 132/88  Pulse 90  Ht 5\' 11"  (1.803 m)  Wt 290 lb 12.8 oz (131.906 kg)  BMI 40.56 kg/m2 Well nourished, well developed, in no acute distress HEENT: normal Neck: no JVD at 90 degrees Cardiac:  normal S1, S2; irregularly irregular rhythm; no murmur Lungs:  clear to auscultation bilaterally, no wheezing, rhonchi or rales Abd: soft, nontender, no hepatomegaly Ext: trace bilateral LE edema Skin: warm and dry Neuro:  CNs 2-12 intact, no focal abnormalities noted  EKG:  AFib, HR 92, NSSTTW changes       ASSESSMENT AND PLAN:  1. Atrial Fibrillation:   Rate controlled.  Plan is for DCCV.  Schedule for tomorrow.  Discussed with Dr. Peter Swaziland (DOD) who agreed to proceed.  Patient has not missed any doses of Xarelto.  We will provide more samples today.  CHADS2=1 (HTN) but age is 9 and it will soon be 2.  He likely would benefit from long term anticoagulation.  Will defer ultimate decision regarding long term anticoagulation to Dr. Verne Carrow.  2. Hypertension:   Controlled.  Continue current therapy.   3. DJD:   Surgery on hold for now.  4. Disposition:  DCCV tomorrow.  Follow up with Dr. Verne Carrow in 3-4 weeks.   Signed, Tereso Newcomer, PA-C  10:57 AM 06/17/2012

## 2012-06-18 ENCOUNTER — Ambulatory Visit (HOSPITAL_COMMUNITY)
Admission: RE | Admit: 2012-06-18 | Discharge: 2012-06-18 | Disposition: A | Discharge status: Home / Self Care | Payer: Medicare Other | Source: Ambulatory Visit | Attending: Cardiovascular Disease | Admitting: Cardiovascular Disease

## 2012-06-18 ENCOUNTER — Encounter (HOSPITAL_COMMUNITY): Payer: Self-pay | Admitting: Anesthesiology

## 2012-06-18 ENCOUNTER — Encounter (HOSPITAL_COMMUNITY)
Admission: RE | Disposition: A | Discharge status: Home / Self Care | Payer: Self-pay | Source: Ambulatory Visit | Attending: Cardiovascular Disease

## 2012-06-18 ENCOUNTER — Ambulatory Visit (HOSPITAL_COMMUNITY): Payer: Medicare Other | Admitting: Anesthesiology

## 2012-06-18 ENCOUNTER — Encounter (HOSPITAL_COMMUNITY): Payer: Self-pay | Admitting: *Deleted

## 2012-06-18 DIAGNOSIS — Z87891 Personal history of nicotine dependence: Secondary | ICD-10-CM | POA: Insufficient documentation

## 2012-06-18 DIAGNOSIS — J449 Chronic obstructive pulmonary disease, unspecified: Secondary | ICD-10-CM | POA: Insufficient documentation

## 2012-06-18 DIAGNOSIS — I10 Essential (primary) hypertension: Secondary | ICD-10-CM | POA: Insufficient documentation

## 2012-06-18 DIAGNOSIS — I4891 Unspecified atrial fibrillation: Secondary | ICD-10-CM | POA: Insufficient documentation

## 2012-06-18 DIAGNOSIS — K219 Gastro-esophageal reflux disease without esophagitis: Secondary | ICD-10-CM | POA: Insufficient documentation

## 2012-06-18 DIAGNOSIS — J4489 Other specified chronic obstructive pulmonary disease: Secondary | ICD-10-CM | POA: Insufficient documentation

## 2012-06-18 HISTORY — PX: CARDIOVERSION: SHX1299

## 2012-06-18 HISTORY — DX: Pain in right knee: M25.561

## 2012-06-18 SURGERY — CARDIOVERSION
Anesthesia: Monitor Anesthesia Care

## 2012-06-18 MED ORDER — SODIUM CHLORIDE 0.9 % IJ SOLN: 3.0000 mL | Freq: Two times a day (BID) | INTRAMUSCULAR | Status: DC

## 2012-06-18 MED ORDER — SODIUM CHLORIDE 0.9 % IV SOLN: 250.0000 mL | INTRAVENOUS | Status: DC

## 2012-06-18 MED ORDER — PROPOFOL 10 MG/ML IV BOLUS
INTRAVENOUS | Status: DC | PRN
  Administered 2012-06-18: 90 mg via INTRAVENOUS

## 2012-06-18 MED ORDER — SODIUM CHLORIDE 0.9 % IV SOLN
INTRAVENOUS | Status: DC | PRN
  Administered 2012-06-18: 11:00:00 via INTRAVENOUS

## 2012-06-18 MED ORDER — SODIUM CHLORIDE 0.9 % IJ SOLN: 3.0000 mL | INTRAMUSCULAR | Status: DC | PRN

## 2012-06-18 NOTE — Anesthesia Preprocedure Evaluation (Signed)
Anesthesia Evaluation  Patient identified by MRN, date of birth, ID band Patient awake    Reviewed: Allergy & Precautions, H&P , NPO status , Patient's Chart, lab work & pertinent test results, reviewed documented beta blocker date and time   History of Anesthesia Complications Negative for: history of anesthetic complications  Airway Mallampati: I TM Distance: >3 FB Neck ROM: Full    Dental  (+) Chipped and Dental Advisory Given   Pulmonary asthma , COPD COPD inhaler, former smoker,  breath sounds clear to auscultation  Pulmonary exam normal       Cardiovascular hypertension, Pt. on medications and Pt. on home beta blockers + dysrhythmias (12/13 ECHO: EF 55%) Atrial Fibrillation Rhythm:Irregular Rate:Normal     Neuro/Psych negative neurological ROS     GI/Hepatic Neg liver ROS, GERD-  Controlled,  Endo/Other  Morbid obesity  Renal/GU negative Renal ROS     Musculoskeletal   Abdominal (+) + obese,   Peds  Hematology   Anesthesia Other Findings   Reproductive/Obstetrics                           Anesthesia Physical Anesthesia Plan  ASA: III  Anesthesia Plan: General   Post-op Pain Management:    Induction: Intravenous  Airway Management Planned: Mask and Natural Airway  Additional Equipment:   Intra-op Plan:   Post-operative Plan:   Informed Consent: I have reviewed the patients History and Physical, chart, labs and discussed the procedure including the risks, benefits and alternatives for the proposed anesthesia with the patient or authorized representative who has indicated his/her understanding and acceptance.   Dental advisory given  Plan Discussed with: CRNA and Surgeon  Anesthesia Plan Comments: (Plan routine monitors, GA for cardioversion)        Anesthesia Quick Evaluation

## 2012-06-18 NOTE — CV Procedure (Signed)
DCC:  90mg  Propofol given by Dr Jean Rosenthal.  Rhythm before cardioversion afib rate 92.  Single 150 J biphasic shock delivered.  Converted to NSR rate 54 with PVC;s.  No immediate neurologic sequelae Has f/u with Dr Alice Reichert already.  Will need to be on uninterupted xarelto for 3 weeks before stopping for knee surgery assuming he stays in [facility] 11:14 AM 06/18/2012

## 2012-06-18 NOTE — Transfer of Care (Signed)
Immediate Anesthesia Transfer of Care Note  Patient: John Stark  Procedure(s) Performed: Procedure(s) (LRB) with comments: CARDIOVERSION (N/A)  Patient Location: PACU  Anesthesia Type:MAC  Level of Consciousness: awake, alert , oriented and sedated  Airway & Oxygen Therapy: Patient Spontanous Breathing and Patient connected to nasal cannula oxygen  Post-op Assessment: Report given to PACU RN, Post -op Vital signs reviewed and stable and Patient moving all extremities  Post vital signs: Reviewed and stable  Complications: No apparent anesthesia complications

## 2012-06-18 NOTE — Interval H&P Note (Signed)
History and Physical Interval Note:  06/18/2012 10:44 AM  John Stark  has presented today for surgery, with the diagnosis of a-fib  The various methods of treatment have been discussed with the patient and family. After consideration of risks, benefits and other options for treatment, the patient has consented to  Procedure(s) (LRB) with comments: CARDIOVERSION (N/A) as a surgical intervention .  The patient's history has been reviewed, patient examined, no change in status, stable for surgery.  I have reviewed the patient's chart and labs.  Questions were answered to the patient's satisfaction.     Charlton Haws

## 2012-06-18 NOTE — Anesthesia Postprocedure Evaluation (Signed)
  Anesthesia Post-op Note  Patient: John Stark  Procedure(s) Performed: Procedure(s) (LRB) with comments: CARDIOVERSION (N/A)  Patient Location: PACU  Anesthesia Type:MAC  Level of Consciousness: awake, alert , oriented and sedated  Airway and Oxygen Therapy: Patient Spontanous Breathing and Patient connected to nasal cannula oxygen  Post-op Pain: none  Post-op Assessment: Post-op Vital signs reviewed, Patient's Cardiovascular Status Stable, Respiratory Function Stable, Patent Airway and No signs of Nausea or vomiting  Post-op Vital Signs: Reviewed and stable  Complications: No apparent anesthesia complications

## 2012-06-18 NOTE — Preoperative (Signed)
Beta Blockers   Reason not to administer Beta Blockers:Not Applicable 

## 2012-06-20 ENCOUNTER — Encounter (HOSPITAL_COMMUNITY): Payer: Self-pay | Admitting: Cardiovascular Disease

## 2012-07-09 ENCOUNTER — Encounter: Payer: Self-pay | Admitting: Physician Assistant

## 2012-07-09 ENCOUNTER — Ambulatory Visit (INDEPENDENT_AMBULATORY_CARE_PROVIDER_SITE_OTHER): Payer: Medicare Other | Admitting: Physician Assistant

## 2012-07-09 VITALS — BP 150/92 | HR 52 | Ht 71.0 in | Wt 290.1 lb

## 2012-07-09 DIAGNOSIS — M1990 Unspecified osteoarthritis, unspecified site: Secondary | ICD-10-CM

## 2012-07-09 DIAGNOSIS — I4891 Unspecified atrial fibrillation: Secondary | ICD-10-CM

## 2012-07-09 DIAGNOSIS — I10 Essential (primary) hypertension: Secondary | ICD-10-CM

## 2012-07-09 NOTE — Patient Instructions (Addendum)
Your physician recommends that you return for lab work in: 08/23/12 BMET, CBC W/DIFF  PLEASE FOLLOW UP WITH DR. Clifton James 10/15/12 @ 10:30   NO CHANGES WERE MADE

## 2012-07-09 NOTE — Progress Notes (Signed)
[address] [place], [place]  [address] Phone: [phone], Fax:  [phone]  Date:  07/09/2012   Name:  John Stark   DOB:  Sep 13, 1935   MRN:  [id number]  PCP:  Benita Stabile, MD  Primary Cardiologist:  Dr. Verne Carrow  Primary Electrophysiologist:  None    History of Present Illness: John Stark is a 77 y.o. male who returns for follow up on AFib.  He has a hx of HTN, HL, BPH, GERD, DJD.  EF by echo in 2010 was normal.  He was recently noted to be in AFib during pre-op workup for knee surgery and was seen by Dr. Clifton James.  He was started on Xarelto for anticoagulation and Metoprolol for rate control.  Echo 05/27/12:  Mild LVH, EF 55%, aortic sclerosis, no AS, mild LAE, mild RVE, moderate RV dysfunction, mild RAE, PASP 32.  Surgery was postponed.  Patient underwent DCCV 06/18/12 with restoration of NSR.  Returns today for follow up and is still in NSR.  He is asymptomatic when in AFib.  He denies chest pain, dyspnea, orthopnea, PND, syncope, near syncope.    Labs (11/13):   K 4.5, creatinine 0.98, Hgb 13.2 Labs (12/13):   TSH 0.48, K 4.4, creatinine 1.0, Hgb 13.3  CXR (11/13):   No acute disease, stable bilateral granulomas   Wt Readings from Last 3 Encounters:  06/17/12 290 lb 12.8 oz (131.906 kg)  06/05/12 290 lb (131.543 kg)  05/22/12 288 lb (130.636 kg)     Past Medical History  Diagnosis Date  . Atrial fibrillation     a. Echo 05/27/12:  Mild LVH, EF 55%, aortic sclerosis, no AS, mild LAE, mild RVE, moderate RV dysfunction, mild RAE, PASP 32;  b. Xarelto started 05/22/12  . Overweight   . HTN (hypertension)   . HLD (hyperlipidemia)   . GERD (gastroesophageal reflux disease)   . Osteoarthrosis, unspecified whether generalized or localized, unspecified site   . Asthma     "as a child"  . Right knee pain     awaiting knee replacement    Current Outpatient Prescriptions  Medication Sig Dispense Refill  . amLODipine  (NORVASC) 5 MG tablet Take 5 mg by mouth daily.      . benazepril (LOTENSIN) 10 MG tablet Take 10 mg by mouth daily.      Marland Kitchen doxazosin (CARDURA) 4 MG tablet Take 4 mg by mouth daily.        . famotidine (PEPCID) 20 MG tablet Take 1 tablet (20 mg total) by mouth daily.  30 tablet  6  . finasteride (PROSCAR) 5 MG tablet Take 5 mg by mouth daily.        . furosemide (LASIX) 20 MG tablet Take 20 mg by mouth daily.        Marland Kitchen HYDROcodone-acetaminophen (NORCO/VICODIN) 5-325 MG per tablet Take 1 tablet by mouth 2 (two) times daily as needed. For pain      . loratadine (CLARITIN) 10 MG tablet Take 10 mg by mouth daily as needed. For allergies      . meloxicam (MOBIC) 7.5 MG tablet Take 7.5 mg by mouth 2 (two) times daily as needed. For pain      . metoprolol (LOPRESSOR) 50 MG tablet Take 1 tablet (50 mg total) by mouth 2 (two) times daily.  60 tablet  6  . Nutritional Supplements (JUICE PLUS FIBRE PO) Take 6 tablets by mouth daily. 2 berry tablets, 2 fruit tablets, and 2  veggie tablets      . potassium chloride (MICRO-K) 10 MEQ CR capsule Take 10 mEq by mouth daily.        . pravastatin (PRAVACHOL) 40 MG tablet Take 40 mg by mouth daily.       . Rivaroxaban (XARELTO) 20 MG TABS Take 1 tablet (20 mg total) by mouth daily.  30 tablet  6  . simvastatin (ZOCOR) 10 MG tablet Take 10 mg by mouth at bedtime.      . traMADol (ULTRAM) 50 MG tablet Take 50 mg by mouth every 6 (six) hours as needed. For pain        Allergies:  No Known Allergies  Social History:  The patient  reports that he quit smoking about 35 years ago. His smoking use included Cigarettes. He has a 20 pack-year smoking history. He does not have any smokeless tobacco history on file. He reports that he does not drink alcohol or use illicit drugs.   ROS:  Please see the history of present illness.      All other systems reviewed and negative.   PHYSICAL EXAM: VS:  BP 150/92  Pulse 52  Ht 5\' 11"  (1.803 m)  Wt 290 lb 1.9 oz (131.598 kg)  BMI  40.46 kg/m2 Well nourished, well developed, in no acute distress HEENT: normal Neck: no JVD at 90 degrees Cardiac:  normal S1, S2; RRR; no murmur Lungs:  clear to auscultation bilaterally, no wheezing, rhonchi or rales Abd: soft, nontender, no hepatomegaly Ext: trace bilateral LE edema Skin: warm and dry Neuro:  CNs 2-12 intact, no focal abnormalities noted  EKG:  Sinus brady, HR 52, PACs, NSSTTW changes      ASSESSMENT AND PLAN:  1. Atrial Fibrillation:   Maintaining NSR.  He will be 4 weeks post DCCV on 07/16/12.  He may proceed with his knee surgery after this date.  We discussed that he could hold his Xarelto x 2 doses prior to his procedure.  He can resume this post op when ok per surgery.  Arrange follow up BMET and CBC in 6-8 weeks.  Would continue Xarelto long term as his risk for stroke is significant. 2. Hypertension:   Borderline control.  Monitor for now.  3. DJD:   As noted, he may proceed with his knee surgery after 1/21.  He has no unstable cardiac conditions.  He remains in NSR.  He was able to achieve 4 METs ~ 1 month ago (prior to his knee pain worsening) without anginal symptoms.  He does not require any further cardiac evaluation prior to his non-cardiac procedure.  Our service is available as necessary. 4. Disposition:  Follow up with Dr. Verne Carrow in 3 mos.   Luna Glasgow, PA-C  11:28 AM 07/09/2012

## 2012-07-16 ENCOUNTER — Telehealth: Payer: Self-pay | Admitting: Cardiovascular Disease

## 2012-07-16 NOTE — Telephone Encounter (Signed)
New problem:  Status of cardiac clearance for upcoming knee surgery  

## 2012-07-16 NOTE — Telephone Encounter (Signed)
Spoke with pt and reviewed office note from visit with Tereso Newcomer, PA on July 09, 2012 with him. I told him I would fax this note to Dr. Turner Daniels at [facility].  Attention:Kathy. [phone]

## 2012-07-23 ENCOUNTER — Other Ambulatory Visit: Payer: Self-pay | Admitting: Orthopedic Surgery

## 2012-07-26 ENCOUNTER — Encounter (HOSPITAL_COMMUNITY): Payer: Self-pay | Admitting: Pharmacy Technician

## 2012-07-30 ENCOUNTER — Encounter (HOSPITAL_COMMUNITY)
Admission: RE | Admit: 2012-07-30 | Discharge: 2012-07-30 | Disposition: A | Discharge status: Home / Self Care | Payer: Medicare Other | Source: Ambulatory Visit | Attending: Orthopedic Surgery | Admitting: Orthopedic Surgery

## 2012-07-30 ENCOUNTER — Encounter (HOSPITAL_COMMUNITY): Payer: Self-pay

## 2012-07-30 ENCOUNTER — Other Ambulatory Visit: Payer: Self-pay | Admitting: Orthopedic Surgery

## 2012-07-30 HISTORY — DX: Unspecified hearing loss, right ear: H91.91

## 2012-07-30 HISTORY — DX: Chronic obstructive pulmonary disease, unspecified: J44.9

## 2012-07-30 HISTORY — DX: Benign prostatic hyperplasia without lower urinary tract symptoms: N40.0

## 2012-07-30 LAB — BASIC METABOLIC PANEL
BUN: 19 mg/dL (ref 6–23)
CO2: 26 mEq/L (ref 19–32)
Chloride: 103 mEq/L (ref 96–112)
Creatinine, Ser: 0.99 mg/dL (ref 0.50–1.35)
Glucose, Bld: 96 mg/dL (ref 70–99)

## 2012-07-30 LAB — SURGICAL PCR SCREEN
MRSA, PCR: NEGATIVE
Staphylococcus aureus: NEGATIVE

## 2012-07-30 LAB — CBC WITH DIFFERENTIAL/PLATELET
Basophils Absolute: 0.1 10*3/uL (ref 0.0–0.1)
Eosinophils Relative: 4 % (ref 0–5)
HCT: 40.1 % (ref 39.0–52.0)
Lymphocytes Relative: 25 % (ref 12–46)
MCHC: 33.9 g/dL (ref 30.0–36.0)
MCV: 90.7 fL (ref 78.0–100.0)
Monocytes Absolute: 0.9 10*3/uL (ref 0.1–1.0)
RDW: 14.1 % (ref 11.5–15.5)
WBC: 9.2 10*3/uL (ref 4.0–10.5)

## 2012-07-30 LAB — URINALYSIS, ROUTINE W REFLEX MICROSCOPIC
Hgb urine dipstick: NEGATIVE
Leukocytes, UA: NEGATIVE
Nitrite: NEGATIVE
Specific Gravity, Urine: 1.017 (ref 1.005–1.030)
Urobilinogen, UA: 0.2 mg/dL (ref 0.0–1.0)

## 2012-07-30 LAB — TYPE AND SCREEN: Antibody Screen: NEGATIVE

## 2012-07-30 NOTE — Pre-Procedure Instructions (Signed)
John Stark  07/30/2012   Your procedure is scheduled on:  Wednesday, February 12  Report to [facility] Stay [facility] at 1045 AM.  Call this number if you have problems the morning of surgery: [phone]   Remember:   Do not eat food or drink liquids after midnight.Tuesday night   Take these medicines the morning of surgery with A SIP OF WATER: Amlodipine,Doxazosin,Famotidine,Hydrocodone or Tramadol,Metoprolol,   Do not wear jewelry, make-up or nail polish.  Do not wear lotions, powders, or perfumes. You may wear deodorant.  Do not shave 48 hours prior to surgery. Men may shave face and neck.  Do not bring valuables to the hospital.  Contacts, dentures or bridgework may not be worn into surgery.  Leave suitcase in the car. After surgery it may be brought to your room.  For patients admitted to the hospital, checkout time is 11:00 AM the day of  discharge.         Special Instructions: Incentive Spirometry - Practice and bring it with you on the day of surgery. Shower using CHG 2 nights before surgery and the night before surgery.  If you shower the day of surgery use CHG.  Use special wash - you have one bottle of CHG for all showers.  You should use approximately 1/3 of the bottle for each shower.   Please read over the following fact sheets that you were given: Pain Booklet, Coughing and Deep Breathing, Blood Transfusion Information, Total Joint Packet, MRSA Information and Surgical Site Infection Prevention

## 2012-07-30 NOTE — Progress Notes (Signed)
COSA questionnaire faxed to Dr  Clovis Riley.

## 2012-08-06 MED ORDER — DEXTROSE 5 % IV SOLN
3.0000 g | INTRAVENOUS | Status: AC
  Administered 2012-08-07: 3 g via INTRAVENOUS
  Filled 2012-08-06: qty 3000

## 2012-08-06 NOTE — H&P (Signed)
TOTAL KNEE ADMISSION H&P  Patient is being admitted for right total knee arthroplasty.  Subjective:  Chief Complaint:right knee pain.  HPI: John Stark, 77 y.o. male, has a history of pain and functional disability in the right knee due to arthritis and has failed non-surgical conservative treatments for greater than 12 weeks to includeNSAID's and/or analgesics, corticosteriod injections and viscosupplementation injections.  Onset of symptoms was gradual, starting 2 years ago with gradually worsening course since that time. The patient noted prior procedures on the knee to include  arthroscopy on the right knee(s).  Patient currently rates pain in the right knee(s) at 7 out of 10 with activity. Patient has night pain, worsening of pain with activity and weight bearing and pain that interferes with activities of daily living.  Patient has evidence of subchondral cysts and joint space narrowing by imaging studies. There is no active infection.  Patient Active Problem List   Diagnosis Date Noted  . Atrial fibrillation 06/05/2012  . DIASTOLIC HEART FAILURE, CHRONIC 06/15/2009  . HYPERLIPIDEMIA-MIXED 12/04/2008  . OVERWEIGHT/OBESITY 12/04/2008  . HYPERTENSION, UNSPECIFIED 12/04/2008  . GERD 12/04/2008  . OSTEOARTHRITIS 12/04/2008  . PALPITATIONS 12/04/2008   Past Medical History  Diagnosis Date  . Atrial fibrillation     a. Echo 05/27/12:  Mild LVH, EF 55%, aortic sclerosis, no AS, mild LAE, mild RVE, moderate RV dysfunction, mild RAE, PASP 32;  b. Xarelto started 05/22/12  . Overweight   . HTN (hypertension)   . HLD (hyperlipidemia)   . GERD (gastroesophageal reflux disease)   . Osteoarthrosis, unspecified whether generalized or localized, unspecified site   . Asthma     "as a child"  . Right knee pain     awaiting knee replacement  . COPD (chronic obstructive pulmonary disease)   . Enlarged prostate   . Hearing loss in right ear     Past Surgical History  Procedure Laterality  Date  . Back surgery      2011  . Cardioversion  06/18/2012    Procedure: CARDIOVERSION;  Surgeon: Wendall Stade, MD;  Location: [facility] ENDOSCOPY;  Service: Cardiovascular;  Laterality: N/A;    No prescriptions prior to admission   No Known Allergies  History  Substance Use Topics  . Smoking status: Former Smoker -- 1.00 packs/day for 20 years    Types: Cigarettes    Quit date: 05/22/1977  . Smokeless tobacco: Not on file     Comment: Quit 1978  . Alcohol Use: No    Family History  Problem Relation Age of Onset  . Cancer Other   . Heart attack Brother 75     Review of Systems  Constitutional: Negative.   HENT: Positive for hearing loss.   Eyes: Negative.   Respiratory: Negative.   Cardiovascular: Negative.   Gastrointestinal: Negative.   Genitourinary: Negative.   Musculoskeletal: Positive for back pain and joint pain.  Skin: Negative.   Neurological: Negative.   Endo/Heme/Allergies: Negative.   Psychiatric/Behavioral: Negative.     Objective:  Physical Exam  Constitutional: He is oriented to person, place, and time. He appears well-developed and well-nourished.  HENT:  Head: Normocephalic.  Eyes: Pupils are equal, round, and reactive to light.  Cardiovascular: Normal rate.   Respiratory: Breath sounds normal.  GI: Soft.  Musculoskeletal:       Right knee: He exhibits decreased range of motion. Tenderness found. Medial joint line tenderness noted.  Neurological: He is alert and oriented to person, place, and time.  Psychiatric: He  has a normal mood and affect.    Vital signs in last 24 hours:    Labs:   Estimated body mass index is 40.48 kg/(m^2) as calculated from the following:   Height as of 07/09/12: 5\' 11"  (1.803 m).   Weight as of 07/09/12: 131.598 kg (290 lb 1.9 oz).   Imaging Review Plain radiographs demonstrate severe degenerative joint disease of the right knee(s). The overall alignment isneutral. The bone quality appears to be adequate for age  and reported activity level.  Assessment/Plan:  End stage arthritis, right knee   The patient history, physical examination, clinical judgment of the provider and imaging studies are consistent with end stage degenerative joint disease of the right knee(s) and total knee arthroplasty is deemed medically necessary. The treatment options including medical management, injection therapy arthroscopy and arthroplasty were discussed at length. The risks and benefits of total knee arthroplasty were presented and reviewed. The risks due to aseptic loosening, infection, stiffness, patella tracking problems, thromboembolic complications and other imponderables were discussed. The patient acknowledged the explanation, agreed to proceed with the plan and consent was signed. Patient is being admitted for inpatient treatment for surgery, pain control, PT, OT, prophylactic antibiotics, VTE prophylaxis, progressive ambulation and ADL's and discharge planning. The patient is planning to be discharged home with home health services

## 2012-08-07 ENCOUNTER — Encounter (HOSPITAL_COMMUNITY)
Admission: RE | Disposition: A | Discharge status: Skilled Nursing Facility | Payer: Self-pay | Source: Ambulatory Visit | Attending: Orthopedic Surgery

## 2012-08-07 ENCOUNTER — Encounter (HOSPITAL_COMMUNITY): Payer: Self-pay | Admitting: Anesthesiology

## 2012-08-07 ENCOUNTER — Inpatient Hospital Stay (HOSPITAL_COMMUNITY)
Admission: RE | Admit: 2012-08-07 | Discharge: 2012-08-12 | DRG: 470 | Disposition: A | Discharge status: Skilled Nursing Facility | Payer: Medicare Other | Source: Ambulatory Visit | Attending: Orthopedic Surgery | Admitting: Orthopedic Surgery

## 2012-08-07 ENCOUNTER — Ambulatory Visit (HOSPITAL_COMMUNITY): Payer: Medicare Other | Admitting: Anesthesiology

## 2012-08-07 DIAGNOSIS — Z6841 Body Mass Index (BMI) 40.0 and over, adult: Secondary | ICD-10-CM

## 2012-08-07 DIAGNOSIS — Z01812 Encounter for preprocedural laboratory examination: Secondary | ICD-10-CM

## 2012-08-07 DIAGNOSIS — I4891 Unspecified atrial fibrillation: Secondary | ICD-10-CM | POA: Diagnosis present

## 2012-08-07 DIAGNOSIS — Z87891 Personal history of nicotine dependence: Secondary | ICD-10-CM

## 2012-08-07 DIAGNOSIS — K219 Gastro-esophageal reflux disease without esophagitis: Secondary | ICD-10-CM | POA: Diagnosis present

## 2012-08-07 DIAGNOSIS — J449 Chronic obstructive pulmonary disease, unspecified: Secondary | ICD-10-CM | POA: Diagnosis present

## 2012-08-07 DIAGNOSIS — M1711 Unilateral primary osteoarthritis, right knee: Secondary | ICD-10-CM | POA: Diagnosis present

## 2012-08-07 DIAGNOSIS — I10 Essential (primary) hypertension: Secondary | ICD-10-CM | POA: Diagnosis present

## 2012-08-07 DIAGNOSIS — I5032 Chronic diastolic (congestive) heart failure: Secondary | ICD-10-CM | POA: Diagnosis present

## 2012-08-07 DIAGNOSIS — Z7901 Long term (current) use of anticoagulants: Secondary | ICD-10-CM

## 2012-08-07 DIAGNOSIS — Z79899 Other long term (current) drug therapy: Secondary | ICD-10-CM

## 2012-08-07 DIAGNOSIS — E782 Mixed hyperlipidemia: Secondary | ICD-10-CM | POA: Diagnosis present

## 2012-08-07 DIAGNOSIS — J4489 Other specified chronic obstructive pulmonary disease: Secondary | ICD-10-CM | POA: Diagnosis present

## 2012-08-07 DIAGNOSIS — E669 Obesity, unspecified: Secondary | ICD-10-CM | POA: Diagnosis present

## 2012-08-07 DIAGNOSIS — M1710 Unilateral primary osteoarthritis, unspecified knee: Principal | ICD-10-CM | POA: Diagnosis present

## 2012-08-07 HISTORY — PX: TOTAL KNEE ARTHROPLASTY: SHX125

## 2012-08-07 SURGERY — ARTHROPLASTY, KNEE, TOTAL
Anesthesia: General | Site: Knee | Laterality: Right | Wound class: Clean

## 2012-08-07 MED ORDER — ONDANSETRON HCL 4 MG/2ML IJ SOLN
4.0000 mg | Freq: Four times a day (QID) | INTRAMUSCULAR | Status: DC | PRN
  Administered 2012-08-07: 4 mg via INTRAVENOUS

## 2012-08-07 MED ORDER — BENAZEPRIL HCL 10 MG PO TABS
10.0000 mg | ORAL_TABLET | Freq: Every day | ORAL | Status: DC
  Administered 2012-08-08 – 2012-08-12 (×5): 10 mg via ORAL
  Filled 2012-08-07 (×6): qty 1

## 2012-08-07 MED ORDER — METOPROLOL TARTRATE 50 MG PO TABS
50.0000 mg | ORAL_TABLET | Freq: Two times a day (BID) | ORAL | Status: DC
  Administered 2012-08-07 – 2012-08-12 (×10): 50 mg via ORAL
  Filled 2012-08-07 (×12): qty 1

## 2012-08-07 MED ORDER — EPHEDRINE SULFATE 50 MG/ML IJ SOLN
INTRAMUSCULAR | Status: DC | PRN
  Administered 2012-08-07 (×3): 10 mg via INTRAVENOUS

## 2012-08-07 MED ORDER — PROPOFOL 10 MG/ML IV BOLUS
INTRAVENOUS | Status: DC | PRN
  Administered 2012-08-07: 200 mg via INTRAVENOUS

## 2012-08-07 MED ORDER — HYDROMORPHONE HCL PF 1 MG/ML IJ SOLN
0.2500 mg | INTRAMUSCULAR | Status: DC | PRN
  Administered 2012-08-07 (×2): 0.5 mg via INTRAVENOUS

## 2012-08-07 MED ORDER — LACTATED RINGERS IV SOLN
INTRAVENOUS | Status: DC
  Administered 2012-08-07: 11:00:00 via INTRAVENOUS

## 2012-08-07 MED ORDER — METHOCARBAMOL 100 MG/ML IJ SOLN
500.0000 mg | Freq: Four times a day (QID) | INTRAVENOUS | Status: DC | PRN
  Filled 2012-08-07: qty 5

## 2012-08-07 MED ORDER — ALUM & MAG HYDROXIDE-SIMETH 200-200-20 MG/5ML PO SUSP: 30.0000 mL | ORAL | Status: DC | PRN

## 2012-08-07 MED ORDER — OXYCODONE HCL 5 MG/5ML PO SOLN: 5.0000 mg | Freq: Once | ORAL | Status: DC | PRN

## 2012-08-07 MED ORDER — SODIUM CHLORIDE 0.9 % IR SOLN
Status: DC | PRN
  Administered 2012-08-07: 3000 mL

## 2012-08-07 MED ORDER — KCL IN DEXTROSE-NACL 20-5-0.45 MEQ/L-%-% IV SOLN
INTRAVENOUS | Status: DC
  Administered 2012-08-07 – 2012-08-08 (×2): via INTRAVENOUS
  Filled 2012-08-07 (×9): qty 1000

## 2012-08-07 MED ORDER — FINASTERIDE 5 MG PO TABS
5.0000 mg | ORAL_TABLET | Freq: Every day | ORAL | Status: DC
  Administered 2012-08-07 – 2012-08-12 (×6): 5 mg via ORAL
  Filled 2012-08-07 (×6): qty 1

## 2012-08-07 MED ORDER — OXYCODONE HCL 5 MG PO TABS: 5.0000 mg | ORAL_TABLET | Freq: Once | ORAL | Status: DC | PRN

## 2012-08-07 MED ORDER — MAGNESIUM HYDROXIDE 400 MG/5ML PO SUSP: 30.0000 mL | Freq: Every day | ORAL | Status: DC | PRN

## 2012-08-07 MED ORDER — FUROSEMIDE 20 MG PO TABS
20.0000 mg | ORAL_TABLET | Freq: Every day | ORAL | Status: DC
  Administered 2012-08-07 – 2012-08-12 (×6): 20 mg via ORAL
  Filled 2012-08-07 (×6): qty 1

## 2012-08-07 MED ORDER — METOCLOPRAMIDE HCL 5 MG/ML IJ SOLN: 5.0000 mg | Freq: Three times a day (TID) | INTRAMUSCULAR | Status: DC | PRN

## 2012-08-07 MED ORDER — ONDANSETRON HCL 4 MG/2ML IJ SOLN
INTRAMUSCULAR | Status: AC
  Filled 2012-08-07: qty 2

## 2012-08-07 MED ORDER — FLEET ENEMA 7-19 GM/118ML RE ENEM: 1.0000 | ENEMA | Freq: Once | RECTAL | Status: AC | PRN

## 2012-08-07 MED ORDER — CEFUROXIME SODIUM 1.5 G IJ SOLR
INTRAMUSCULAR | Status: AC
  Filled 2012-08-07: qty 1.5

## 2012-08-07 MED ORDER — LIDOCAINE HCL (CARDIAC) 20 MG/ML IV SOLN
INTRAVENOUS | Status: DC | PRN
  Administered 2012-08-07: 70 mg via INTRAVENOUS

## 2012-08-07 MED ORDER — DEXTROSE-NACL 5-0.45 % IV SOLN: INTRAVENOUS | Status: DC

## 2012-08-07 MED ORDER — SUCCINYLCHOLINE CHLORIDE 20 MG/ML IJ SOLN
INTRAMUSCULAR | Status: DC | PRN
  Administered 2012-08-07: 100 mg via INTRAVENOUS

## 2012-08-07 MED ORDER — PHENOL 1.4 % MT LIQD
1.0000 | OROMUCOSAL | Status: DC | PRN
  Administered 2012-08-08: 1 via OROMUCOSAL
  Filled 2012-08-07: qty 177

## 2012-08-07 MED ORDER — CELECOXIB 200 MG PO CAPS
200.0000 mg | ORAL_CAPSULE | Freq: Two times a day (BID) | ORAL | Status: DC
  Administered 2012-08-08 – 2012-08-12 (×9): 200 mg via ORAL
  Filled 2012-08-07 (×12): qty 1

## 2012-08-07 MED ORDER — CEFUROXIME SODIUM 1.5 G IJ SOLR
INTRAMUSCULAR | Status: DC | PRN
  Administered 2012-08-07: 1.5 g

## 2012-08-07 MED ORDER — MIDAZOLAM HCL 2 MG/2ML IJ SOLN: 1.0000 mg | INTRAMUSCULAR | Status: DC | PRN

## 2012-08-07 MED ORDER — ACETAMINOPHEN 325 MG PO TABS
650.0000 mg | ORAL_TABLET | Freq: Four times a day (QID) | ORAL | Status: DC | PRN
  Administered 2012-08-09: 650 mg via ORAL
  Filled 2012-08-07: qty 2

## 2012-08-07 MED ORDER — MENTHOL 3 MG MT LOZG
1.0000 | LOZENGE | OROMUCOSAL | Status: DC | PRN
  Filled 2012-08-07: qty 9

## 2012-08-07 MED ORDER — MIDAZOLAM HCL 2 MG/2ML IJ SOLN
INTRAMUSCULAR | Status: AC
  Administered 2012-08-07: 11:00:00
  Filled 2012-08-07: qty 2

## 2012-08-07 MED ORDER — ROCURONIUM BROMIDE 100 MG/10ML IV SOLN
INTRAVENOUS | Status: DC | PRN
  Administered 2012-08-07: 30 mg via INTRAVENOUS

## 2012-08-07 MED ORDER — HYDROMORPHONE HCL PF 1 MG/ML IJ SOLN: 1.0000 mg | INTRAMUSCULAR | Status: DC | PRN

## 2012-08-07 MED ORDER — CHLORHEXIDINE GLUCONATE 4 % EX LIQD
60.0000 mL | Freq: Once | CUTANEOUS | Status: DC
Start: 2012-08-07 — End: 2012-08-07

## 2012-08-07 MED ORDER — OXYCODONE HCL 5 MG PO TABS
5.0000 mg | ORAL_TABLET | ORAL | Status: DC | PRN
  Administered 2012-08-07 – 2012-08-08 (×5): 10 mg via ORAL
  Filled 2012-08-07 (×5): qty 2

## 2012-08-07 MED ORDER — ZOLPIDEM TARTRATE 5 MG PO TABS: 5.0000 mg | ORAL_TABLET | Freq: Every evening | ORAL | Status: DC | PRN

## 2012-08-07 MED ORDER — METOCLOPRAMIDE HCL 10 MG PO TABS: 5.0000 mg | ORAL_TABLET | Freq: Three times a day (TID) | ORAL | Status: DC | PRN

## 2012-08-07 MED ORDER — RIVAROXABAN 20 MG PO TABS
20.0000 mg | ORAL_TABLET | Freq: Every day | ORAL | Status: DC
  Administered 2012-08-08 – 2012-08-11 (×4): 20 mg via ORAL
  Filled 2012-08-07 (×5): qty 1

## 2012-08-07 MED ORDER — FAMOTIDINE 20 MG PO TABS
20.0000 mg | ORAL_TABLET | Freq: Every day | ORAL | Status: DC
  Administered 2012-08-08 – 2012-08-11 (×4): 20 mg via ORAL
  Filled 2012-08-07 (×5): qty 1

## 2012-08-07 MED ORDER — POTASSIUM CHLORIDE CRYS ER 10 MEQ PO TBCR
10.0000 meq | EXTENDED_RELEASE_TABLET | Freq: Two times a day (BID) | ORAL | Status: DC
  Administered 2012-08-07 – 2012-08-12 (×10): 10 meq via ORAL
  Filled 2012-08-07 (×11): qty 1

## 2012-08-07 MED ORDER — FENTANYL CITRATE 0.05 MG/ML IJ SOLN: 50.0000 ug | INTRAMUSCULAR | Status: DC | PRN

## 2012-08-07 MED ORDER — BISACODYL 5 MG PO TBEC: 5.0000 mg | DELAYED_RELEASE_TABLET | Freq: Every day | ORAL | Status: DC | PRN

## 2012-08-07 MED ORDER — METHOCARBAMOL 500 MG PO TABS
500.0000 mg | ORAL_TABLET | Freq: Four times a day (QID) | ORAL | Status: DC | PRN
  Administered 2012-08-08 – 2012-08-11 (×4): 500 mg via ORAL
  Filled 2012-08-07 (×4): qty 1

## 2012-08-07 MED ORDER — ACETAMINOPHEN 650 MG RE SUPP: 650.0000 mg | Freq: Four times a day (QID) | RECTAL | Status: DC | PRN

## 2012-08-07 MED ORDER — AMLODIPINE BESYLATE 5 MG PO TABS
5.0000 mg | ORAL_TABLET | Freq: Every day | ORAL | Status: DC
  Administered 2012-08-08 – 2012-08-11 (×4): 5 mg via ORAL
  Filled 2012-08-07 (×5): qty 1

## 2012-08-07 MED ORDER — GLYCOPYRROLATE 0.2 MG/ML IJ SOLN
INTRAMUSCULAR | Status: DC | PRN
  Administered 2012-08-07: 0.2 mg via INTRAVENOUS
  Administered 2012-08-07: .2 mg via INTRAVENOUS
  Administered 2012-08-07: .4 mg via INTRAVENOUS

## 2012-08-07 MED ORDER — HYDROMORPHONE HCL PF 1 MG/ML IJ SOLN
INTRAMUSCULAR | Status: AC
  Filled 2012-08-07: qty 1

## 2012-08-07 MED ORDER — ONDANSETRON HCL 4 MG PO TABS: 4.0000 mg | ORAL_TABLET | Freq: Four times a day (QID) | ORAL | Status: DC | PRN

## 2012-08-07 MED ORDER — DOXAZOSIN MESYLATE 4 MG PO TABS
4.0000 mg | ORAL_TABLET | Freq: Every day | ORAL | Status: DC
  Administered 2012-08-08 – 2012-08-11 (×4): 4 mg via ORAL
  Filled 2012-08-07 (×5): qty 1

## 2012-08-07 MED ORDER — FENTANYL CITRATE 0.05 MG/ML IJ SOLN
INTRAMUSCULAR | Status: AC
  Administered 2012-08-07: 11:00:00
  Filled 2012-08-07: qty 2

## 2012-08-07 MED ORDER — ACETAMINOPHEN 10 MG/ML IV SOLN
1000.0000 mg | Freq: Four times a day (QID) | INTRAVENOUS | Status: AC
  Administered 2012-08-07 – 2012-08-08 (×3): 1000 mg via INTRAVENOUS
  Filled 2012-08-07 (×4): qty 100

## 2012-08-07 MED ORDER — DIPHENHYDRAMINE HCL 12.5 MG/5ML PO ELIX
12.5000 mg | ORAL_SOLUTION | ORAL | Status: DC | PRN
Start: 2012-08-07 — End: 2012-08-12
  Administered 2012-08-08: 25 mg via ORAL
  Filled 2012-08-07: qty 10

## 2012-08-07 MED ORDER — BUPIVACAINE-EPINEPHRINE PF 0.5-1:200000 % IJ SOLN
INTRAMUSCULAR | Status: DC | PRN
  Administered 2012-08-07: 30 mL

## 2012-08-07 MED ORDER — NEOSTIGMINE METHYLSULFATE 1 MG/ML IJ SOLN
INTRAMUSCULAR | Status: DC | PRN
  Administered 2012-08-07: 3 mg via INTRAVENOUS

## 2012-08-07 MED ORDER — FENTANYL CITRATE 0.05 MG/ML IJ SOLN
INTRAMUSCULAR | Status: DC | PRN
  Administered 2012-08-07: 150 ug via INTRAVENOUS

## 2012-08-07 MED ORDER — LORATADINE 10 MG PO TABS
10.0000 mg | ORAL_TABLET | Freq: Every day | ORAL | Status: DC | PRN
  Filled 2012-08-07: qty 1

## 2012-08-07 MED ORDER — ONDANSETRON HCL 4 MG/2ML IJ SOLN: 4.0000 mg | Freq: Four times a day (QID) | INTRAMUSCULAR | Status: DC | PRN

## 2012-08-07 MED ORDER — LIDOCAINE HCL 4 % MT SOLN
OROMUCOSAL | Status: DC | PRN
  Administered 2012-08-07: 4 mL via TOPICAL

## 2012-08-07 MED ORDER — ONDANSETRON HCL 4 MG/2ML IJ SOLN
INTRAMUSCULAR | Status: DC | PRN
  Administered 2012-08-07: 4 mg via INTRAVENOUS

## 2012-08-07 MED ORDER — LACTATED RINGERS IV SOLN
INTRAVENOUS | Status: DC | PRN
  Administered 2012-08-07 (×2): via INTRAVENOUS

## 2012-08-07 SURGICAL SUPPLY — 56 items
BANDAGE ELASTIC 6 VELCRO ST LF (GAUZE/BANDAGES/DRESSINGS) ×2 IMPLANT
BANDAGE ESMARK 6X9 LF (GAUZE/BANDAGES/DRESSINGS) ×1 IMPLANT
BLADE SAG 18X100X1.27 (BLADE) ×2 IMPLANT
BLADE SAW SGTL 13X75X1.27 (BLADE) ×2 IMPLANT
BLADE SURG ROTATE 9660 (MISCELLANEOUS) IMPLANT
BNDG ELASTIC 6X10 VLCR STRL LF (GAUZE/BANDAGES/DRESSINGS) ×2 IMPLANT
BNDG ESMARK 6X9 LF (GAUZE/BANDAGES/DRESSINGS) ×2
BOWL SMART MIX CTS (DISPOSABLE) ×2 IMPLANT
CEMENT HV SMART SET (Cement) ×4 IMPLANT
CLOTH BEACON ORANGE TIMEOUT ST (SAFETY) ×2 IMPLANT
COVER SURGICAL LIGHT HANDLE (MISCELLANEOUS) ×2 IMPLANT
CUFF TOURNIQUET SINGLE 34IN LL (TOURNIQUET CUFF) IMPLANT
CUFF TOURNIQUET SINGLE 44IN (TOURNIQUET CUFF) IMPLANT
DRAPE EXTREMITY T 121X128X90 (DRAPE) ×2 IMPLANT
DRAPE U-SHAPE 47X51 STRL (DRAPES) ×2 IMPLANT
DURAPREP 26ML APPLICATOR (WOUND CARE) ×2 IMPLANT
ELECT REM PT RETURN 9FT ADLT (ELECTROSURGICAL) ×2
ELECTRODE REM PT RTRN 9FT ADLT (ELECTROSURGICAL) ×1 IMPLANT
EVACUATOR 1/8 PVC DRAIN (DRAIN) ×2 IMPLANT
GAUZE XEROFORM 1X8 LF (GAUZE/BANDAGES/DRESSINGS) ×2 IMPLANT
GAUZE XEROFORM 5X9 LF (GAUZE/BANDAGES/DRESSINGS) ×2 IMPLANT
GLOVE BIO SURGEON STRL SZ7 (GLOVE) ×2 IMPLANT
GLOVE BIO SURGEON STRL SZ7.5 (GLOVE) ×2 IMPLANT
GLOVE BIOGEL PI IND STRL 7.0 (GLOVE) ×1 IMPLANT
GLOVE BIOGEL PI IND STRL 8 (GLOVE) ×1 IMPLANT
GLOVE BIOGEL PI INDICATOR 7.0 (GLOVE) ×1
GLOVE BIOGEL PI INDICATOR 8 (GLOVE) ×1
GOWN PREVENTION PLUS XLARGE (GOWN DISPOSABLE) ×2 IMPLANT
GOWN STRL NON-REIN LRG LVL3 (GOWN DISPOSABLE) ×4 IMPLANT
HANDPIECE INTERPULSE COAX TIP (DISPOSABLE) ×1
HOOD PEEL AWAY FACE SHEILD DIS (HOOD) ×6 IMPLANT
KIT BASIN OR (CUSTOM PROCEDURE TRAY) ×2 IMPLANT
KIT ROOM TURNOVER OR (KITS) ×2 IMPLANT
MANIFOLD NEPTUNE II (INSTRUMENTS) ×2 IMPLANT
NS IRRIG 1000ML POUR BTL (IV SOLUTION) ×2 IMPLANT
PACK TOTAL JOINT (CUSTOM PROCEDURE TRAY) ×2 IMPLANT
PAD ARMBOARD 7.5X6 YLW CONV (MISCELLANEOUS) ×4 IMPLANT
PADDING CAST ABS 6INX4YD NS (CAST SUPPLIES) ×1
PADDING CAST ABS COTTON 6X4 NS (CAST SUPPLIES) ×1 IMPLANT
PADDING CAST COTTON 6X4 STRL (CAST SUPPLIES) ×2 IMPLANT
SET HNDPC FAN SPRY TIP SCT (DISPOSABLE) ×1 IMPLANT
SPONGE GAUZE 4X4 12PLY (GAUZE/BANDAGES/DRESSINGS) ×2 IMPLANT
STAPLER VISISTAT 35W (STAPLE) ×2 IMPLANT
SUCTION FRAZIER TIP 10 FR DISP (SUCTIONS) ×2 IMPLANT
SUT VIC AB 0 CT1 27 (SUTURE) ×1
SUT VIC AB 0 CT1 27XBRD ANBCTR (SUTURE) ×1 IMPLANT
SUT VIC AB 0 CTX 36 (SUTURE) ×1
SUT VIC AB 0 CTX36XBRD ANTBCTR (SUTURE) ×1 IMPLANT
SUT VIC AB 1 CTX 36 (SUTURE) ×2
SUT VIC AB 1 CTX36XBRD ANBCTR (SUTURE) ×2 IMPLANT
SUT VIC AB 2-0 CT1 27 (SUTURE) ×1
SUT VIC AB 2-0 CT1 TAPERPNT 27 (SUTURE) ×1 IMPLANT
TOWEL OR 17X24 6PK STRL BLUE (TOWEL DISPOSABLE) ×2 IMPLANT
TOWEL OR 17X26 10 PK STRL BLUE (TOWEL DISPOSABLE) ×2 IMPLANT
TRAY FOLEY CATH 14FR (SET/KITS/TRAYS/PACK) IMPLANT
WATER STERILE IRR 1000ML POUR (IV SOLUTION) ×4 IMPLANT

## 2012-08-07 NOTE — Interval H&P Note (Signed)
History and Physical Interval Note:  08/07/2012 11:05 AM  John Stark  has presented today for surgery, with the diagnosis of OSTEOARTHRITIS RIGHT KNEE  The various methods of treatment have been discussed with the patient and family. After consideration of risks, benefits and other options for treatment, the patient has consented to  Procedure(s): TOTAL KNEE ARTHROPLASTY (Right) as a surgical intervention .  The patient's history has been reviewed, patient examined, no change in status, stable for surgery.  I have reviewed the patient's chart and labs.  Questions were answered to the patient's satisfaction.     Nestor Lewandowsky

## 2012-08-07 NOTE — Preoperative (Signed)
Beta Blockers   Reason not to administer Beta Blockers:metoprolol taken today

## 2012-08-07 NOTE — Anesthesia Procedure Notes (Signed)
Anesthesia Regional Block:  Femoral nerve block  Pre-Anesthetic Checklist: ,, timeout performed, Correct Patient, Correct Site, Correct Laterality, Correct Procedure,, site marked, risks and benefits discussed, Surgical consent,  Pre-op evaluation,  At surgeon's request and post-op pain management  Laterality: Right  Prep: chloraprep       Needles:  Injection technique: Single-shot  Needle Type: Echogenic Stimulator Needle     Needle Length: 9cm  Needle Gauge: 21    Additional Needles:  Procedures: nerve stimulator Femoral nerve block  Nerve Stimulator or Paresthesia:  Response: Quadriceps muscle contraction, 0.45 mA,   Additional Responses:   Narrative:  Start time: 08/07/2012 11:01 AM End time: 08/07/2012 11:17 AM Injection made incrementally with aspirations every 5 mL.  Performed by: Personally  Anesthesiologist: Dr Chaney Malling  Additional Notes: Functioning IV was confirmed and monitors were applied.  A 90mm 21ga Arrow echogenic stimulator needle was used. Sterile prep and drape,hand hygiene and sterile gloves were used.  Negative aspiration and negative test dose prior to incremental administration of local anesthetic. The patient tolerated the procedure well.    Femoral nerve block

## 2012-08-07 NOTE — Progress Notes (Signed)
Orthopedic Tech Progress Note Patient Details:  John Stark [birth year] [id number]  CPM Right Knee Right Knee Flexion (Degrees): 60 Right Knee Extension (Degrees): 0 Trapeze bar patient helper  [name] 08/07/2012, 2:24 PM

## 2012-08-07 NOTE — Anesthesia Preprocedure Evaluation (Signed)
Anesthesia Evaluation  Patient identified by MRN, date of birth, ID band Patient awake    Reviewed: Allergy & Precautions, H&P , NPO status , Patient's Chart, lab work & pertinent test results  Airway Mallampati: II  Neck ROM: full    Dental   Pulmonary asthma , COPDformer smoker,          Cardiovascular hypertension, + dysrhythmias Atrial Fibrillation     Neuro/Psych    GI/Hepatic GERD-  ,  Endo/Other  Morbid obesity  Renal/GU      Musculoskeletal   Abdominal   Peds  Hematology   Anesthesia Other Findings   Reproductive/Obstetrics                           Anesthesia Physical Anesthesia Plan  ASA: III  Anesthesia Plan: General and Regional   Post-op Pain Management: MAC Combined w/ Regional for Post-op pain   Induction: Intravenous  Airway Management Planned: Oral ETT  Additional Equipment:   Intra-op Plan:   Post-operative Plan: Extubation in OR  Informed Consent: I have reviewed the patients History and Physical, chart, labs and discussed the procedure including the risks, benefits and alternatives for the proposed anesthesia with the patient or authorized representative who has indicated his/her understanding and acceptance.     Plan Discussed with: CRNA and Surgeon  Anesthesia Plan Comments:         Anesthesia Quick Evaluation

## 2012-08-07 NOTE — Transfer of Care (Signed)
Immediate Anesthesia Transfer of Care Note  Patient: John Stark  Procedure(s) Performed: Procedure(s): TOTAL KNEE ARTHROPLASTY (Right)  Patient Location: PACU  Anesthesia Type:General  Level of Consciousness: sedated  Airway & Oxygen Therapy: Patient Spontanous Breathing and Patient connected to face mask oxygen  Post-op Assessment: Report given to PACU RN, Post -op Vital signs reviewed and stable and Patient moving all extremities X 4  Post vital signs: Reviewed and stable  Complications: No apparent anesthesia complications

## 2012-08-07 NOTE — Op Note (Signed)
PATIENT ID:      John Stark  MRN:     [id number] DOB/AGE:    1935/12/17 / 78 y.o.       OPERATIVE REPORT    DATE OF PROCEDURE:  08/07/2012       PREOPERATIVE DIAGNOSIS:   OSTEOARTHRITIS RIGHT KNEE      Estimated body mass index is 40.48 kg/(m^2) as calculated from the following:   Height as of 07/30/12: 5\' 11"  (1.803 m).   Weight as of 07/09/12: 131.598 kg (290 lb 1.9 oz).                                                        POSTOPERATIVE DIAGNOSIS:   OSTEOARTHRITIS RIGHT KNEE                                                                      PROCEDURE:  Procedure(s): R TOTAL KNEE ARTHROPLASTY Using Depuy Sigma RP implants #5R Femur, #5Tibia, 10mm sigma RP bearing, 35 Patella     SURGEON: Chrsitopher Wik J    ASSISTANT:   Shirl Harris PA-C   (Present and scrubbed throughout the case, critical for assistance with exposure, retraction, instrumentation, and closure.)         ANESTHESIA: GET with Femoral Nerve Block  DRAINS: foley, 2 medium hemovac in knee   TOURNIQUET TIME:   COMPLICATIONS:  None     SPECIMENS: None   INDICATIONS FOR PROCEDURE: The patient has  OSTEOARTHRITIS RIGHT KNEE, varus deformities, XR shows bone on bone arthritis. Patient has failed all conservative measures including anti-inflammatory medicines, narcotics, attempts at  exercise and weight loss, cortisone injections and viscosupplementation.  Risks and benefits of surgery have been discussed, questions answered.   DESCRIPTION OF PROCEDURE: The patient identified by armband, received  right femoral nerve block and IV antibiotics, in the holding area at [facility]. Patient taken to the operating room, appropriate anesthetic  monitors were attached General endotracheal anesthesia induced with  the patient in supine position, Foley catheter was inserted. Tourniquet  applied high to the operative thigh. Lateral post and foot positioner  applied to the table, the lower extremity was then  prepped and draped  in usual sterile fashion from the ankle to the tourniquet. Time-out procedure was performed. The limb was wrapped with an Esmarch bandage and the tourniquet inflated to 350 mmHg. We began the operation by making the anterior midline incision starting at handbreadth above the patella going over the patella 1 cm medial to and  4 cm distal to the tibial tubercle. Small bleeders in the skin and the  subcutaneous tissue identified and cauterized. Transverse retinaculum was incised and reflected medially and a medial parapatellar arthrotomy was accomplished. the patella was everted and theprepatellar fat pad resected. The superficial medial collateral  ligament was then elevated from anterior to posterior along the proximal  flare of the tibia and anterior half of the menisci resected. The knee was hyperflexed exposing bone on bone arthritis. Peripheral and notch osteophytes as well as the cruciate ligaments were then resected. We continued  to  work our way around posteriorly along the proximal tibia, and externally  rotated the tibia subluxing it out from underneath the femur. A McHale  retractor was placed through the notch and a lateral Hohmann retractor  placed, and we then drilled through the proximal tibia in line with the  axis of the tibia followed by an intramedullary guide rod and 2-degree  posterior slope cutting guide. The tibial cutting guide was pinned into place  allowing resection of 4 mm of bone medially and about 8 mm of bone  laterally because of her varus deformity. Satisfied with the tibial resection, we then  entered the distal femur 2 mm anterior to the PCL origin with the  intramedullary guide rod and applied the distal femoral cutting guide  set at 11mm, with 5 degrees of valgus. This was pinned along the  epicondylar axis. At this point, the distal femoral cut was accomplished without difficulty. We then sized for a #5 femoral component and pinned the guide in  3 degrees of external rotation.The chamfer cutting guide was pinned into place. The anterior, posterior, and chamfer cuts were accomplished without difficulty followed by  the Sigma RP box cutting guide and the box cut. We also removed posterior osteophytes from the posterior femoral condyles. At this  time, the knee was brought into full extension. We checked our  extension and flexion gaps and found them symmetric at 10mm.  The patella thickness measured at 22 mm. We set the cutting guide at 15 and removed the posterior 9 mm  of the patella sized for 35 button and drilled the lollipop. The knee  was then once again hyperflexed exposing the proximal tibia. We sized for a #5 tibial base plate, applied the smokestack and the conical reamer followed by the the Delta fin keel punch. We then hammered into place the Sigma RP trial femoral component, inserted a 10-mm trial bearing, trial patellar button, and took the knee through range of motion from 0-130 degrees. No thumb pressure was required for patellar  tracking. At this point, all trial components were removed, a double batch of DePuy HV cement with 1500 mg of Zinacef was mixed and applied to all bony metallic mating surfaces except for the posterior condyles of the femur itself. In order, we  hammered into place the tibial tray and removed excess cement, the femoral component and removed excess cement, a 10-mm Sigma RP bearing  was inserted, and the knee brought to full extension with compression.  The patellar button was clamped into place, and excess cement  removed. While the cement cured the wound was irrigated out with normal saline solution pulse lavage, and medium Hemovac drains were placed from an anterolateral  approach. Ligament stability and patellar tracking were checked and found to be excellent. The parapatellar arthrotomy was closed with  running #1 Vicryl suture. The subcutaneous tissue with 0 and 2-0 undyed  Vicryl suture, and the  skin with skin staples. A dressing of Xeroform,  4 x 4, dressing sponges, Webril, and Ace wrap applied. The patient  awakened, extubated, and taken to recovery room without difficulty.   Gean Birchwood J 08/07/2012, 1:24 PM

## 2012-08-07 NOTE — Anesthesia Postprocedure Evaluation (Signed)
Anesthesia Post Note  Patient: John Stark  Procedure(s) Performed: Procedure(s) (LRB): TOTAL KNEE ARTHROPLASTY (Right)  Anesthesia type: General  Patient location: PACU  Post pain: Pain level controlled  Post assessment: Patient's Cardiovascular Status Stable  Last Vitals:  Filed Vitals:   08/07/12 1555  BP: 136/69  Pulse: 55  Temp: 36.3 C  Resp: 18    Post vital signs: Reviewed and stable  Level of consciousness: alert  Complications: No apparent anesthesia complications

## 2012-08-08 LAB — BASIC METABOLIC PANEL
CO2: 24 mEq/L (ref 19–32)
Calcium: 8.3 mg/dL — ABNORMAL LOW (ref 8.4–10.5)
Glucose, Bld: 117 mg/dL — ABNORMAL HIGH (ref 70–99)
Sodium: 138 mEq/L (ref 135–145)

## 2012-08-08 LAB — CBC
Hemoglobin: 11.1 g/dL — ABNORMAL LOW (ref 13.0–17.0)
MCH: 30.9 pg (ref 26.0–34.0)
RBC: 3.59 MIL/uL — ABNORMAL LOW (ref 4.22–5.81)

## 2012-08-08 NOTE — Evaluation (Signed)
Physical Therapy Evaluation Patient Details Name: John Stark MRN: [id number] DOB: 1935-10-11 Today's Date: 08/08/2012 Time: 7829-5621 PT Time Calculation (min): 29 min  PT Assessment / Plan / Recommendation Clinical Impression  pt rpesents with [name].  pt very motivated to improve mobility.  Per pt and wife plan is to D/C to SNF to [facility] prior to return to home as pt lives in 2 story home.      PT Assessment  Patient needs continued PT services    Follow Up Recommendations  SNF    Does the patient have the potential to tolerate intense rehabilitation      Barriers to Discharge None      Equipment Recommendations  Rolling walker with 5" wheels (3-in-1)    Recommendations for Other Services OT consult   Frequency 7X/week    Precautions / Restrictions Precautions Precautions: Knee;Fall Precaution Booklet Issued: No Restrictions Weight Bearing Restrictions: Yes RLE Weight Bearing: Weight bearing as tolerated   Pertinent Vitals/Pain Pt indicates soreness, but not as painful as expecting.        Mobility  Bed Mobility Bed Mobility: Supine to Sit;Sitting - Scoot to Edge of Bed Supine to Sit: 4: Min assist;With rails;HOB elevated Sitting - Scoot to Edge of Bed: 4: Min assist Details for Bed Mobility Assistance: A with R LE only.  cues for sequencing.   Transfers Transfers: Sit to Stand;Stand to Sit Sit to Stand: 4: Min assist;With upper extremity assist;From bed Stand to Sit: 4: Min assist;With upper extremity assist;To chair/3-in-1;With armrests Details for Transfer Assistance: cues for UE use, positioning of LEs, controlling descent to chair.   Ambulation/Gait Ambulation/Gait Assistance: 4: Min guard Ambulation Distance (Feet): 60 Feet Assistive device: Rolling walker Ambulation/Gait Assistance Details: cues for gait sequencing, upright posture, positioning in RW.   Gait Pattern: Step-to pattern;Decreased step length - left;Decreased stance time -  right;Decreased stride length;Trunk flexed Stairs: No Wheelchair Mobility Wheelchair Mobility: No    Exercises Total Joint Exercises Ankle Circles/Pumps: AROM;Both;10 reps Quad Sets: AROM;Both;10 reps Long Arc Quad: AAROM;Right;10 reps Knee Flexion: AAROM;Right;10 reps   PT Diagnosis: Abnormality of gait  PT Problem List: Decreased strength;Decreased range of motion;Decreased activity tolerance;Decreased balance;Decreased mobility;Decreased knowledge of use of DME;Pain PT Treatment Interventions: DME instruction;Gait training;Stair training;Functional mobility training;Therapeutic activities;Therapeutic exercise;Balance training;Patient/family education   PT Goals Acute Rehab PT Goals PT Goal Formulation: With patient Time For Goal Achievement: 08/15/12 Potential to Achieve Goals: Good Pt will go Supine/Side to Sit: with supervision PT Goal: Supine/Side to Sit - Progress: Goal set today Pt will go Sit to Supine/Side: with supervision PT Goal: Sit to Supine/Side - Progress: Goal set today Pt will go Sit to Stand: with supervision PT Goal: Sit to Stand - Progress: Goal set today Pt will go Stand to Sit: with supervision PT Goal: Stand to Sit - Progress: Goal set today Pt will Ambulate: >150 feet;with supervision;with rolling walker PT Goal: Ambulate - Progress: Goal set today Pt will Go Up / Down Stairs: 3-5 stairs;with min assist;with least restrictive assistive device PT Goal: Up/Down Stairs - Progress: Goal set today Pt will Perform Home Exercise Program: with supervision, verbal cues required/provided PT Goal: Perform Home Exercise Program - Progress: Goal set today  Visit Information  Last PT Received On: 08/08/12 Assistance Needed: +1    Subjective Data  Subjective: So, you're the mean one I heard about.   Patient Stated Goal: Walk without pain.     Prior Functioning  Home Living Lives With: Spouse Available  Help at Discharge: Skilled Nursing Facility Additional  Comments: pt plans to D/C to SNF prior to return home.   Prior Function Level of Independence: Independent with assistive device(s) Able to Take Stairs?: Yes Driving: Yes Communication Communication: No difficulties    Cognition  Cognition Overall Cognitive Status: Appears within functional limits for tasks assessed/performed Arousal/Alertness: Awake/alert Orientation Level: Appears intact for tasks assessed Behavior During Session: [facility] for tasks performed    Extremity/Trunk Assessment Right Lower Extremity Assessment RLE ROM/Strength/Tone: Deficits RLE ROM/Strength/Tone Deficits: AAROM ~ 15 - 80 RLE Sensation: WFL - Light Touch Left Lower Extremity Assessment LLE ROM/Strength/Tone: WFL for tasks assessed LLE Sensation: WFL - Light Touch Trunk Assessment Trunk Assessment: Normal   Balance Balance Balance Assessed: No  End of Session PT - End of Session Equipment Utilized During Treatment: Gait belt Activity Tolerance: Patient tolerated treatment well Patient left: in chair;with call bell/phone within reach Nurse Communication: Mobility status CPM Right Knee CPM Right Knee: Off  GP     [name], [place] [phone] 08/08/2012, 10:46 AM

## 2012-08-08 NOTE — Progress Notes (Signed)
CARE MANAGEMENT NOTE 08/08/2012  Patient:  John Stark, John Stark   Account Number:  [id number]  Date Initiated:  08/08/2012  Documentation initiated by:  Vance Peper  Subjective/Objective Assessment:   77 yr old male s/p right total knee arthroplasty.     Action/Plan:   patient plans to go to SNF for shortterm Rehab. Social worker will follow.   Anticipated DC Date:  08/10/2012   Anticipated DC Plan:  SKILLED NURSING FACILITY  In-house referral  Clinical Social Worker      DC Planning Services  CM consult      Choice offered to / List presented to:          [facility] arranged  NA      Status of service:  Completed, signed off Medicare Important Message given?   (If response is "NO", the following Medicare IM given date fields will be blank) Date Medicare IM given:   Date Additional Medicare IM given:    Discharge Disposition:  SKILLED NURSING FACILITY  Per UR Regulation:    If discussed at Long Length of Stay Meetings, dates discussed:    Comments:

## 2012-08-08 NOTE — Progress Notes (Signed)
Pt evaluated at 9am this morning and is doing very well, reports minimal to no pain in R knee, has not been up with PT yet. Family is present and all are very pleased with his progress thus far, eager to have foley out and begin rehab.   BP 108/54  Pulse 66  Temp(Src) 98.3 F (36.8 C) (Oral)  Resp 18  Ht 5' 10.87" (1.8 m)  Wt 132.5 kg (292 lb 1.8 oz)  BMI 40.9 kg/m2  SpO2 93%  Drain Output: 825 total   Hg 11.1, HCT 32.8, PLTs 146, GLU 117  Pt laying comfortably in hospital bed, R knee dressing CDI, DPP 2+ Cap refill <2sec, drain in place  A/P  -1 day PO R TKR per Dr Turner Daniels, doing well  -Drain removed today w/o difficulty  -Pt to start PT today, he and family believe he will require rehabilitation center after d/c before going home   -Social work consulted  -Foley can be d/c'd once pt ambulatory and comfortable utilizing facilities   -WBAT, CPM R knee  -Cont SCD's, Xarelto  -Pain well controlleld cont Percocet, Celebrex, Robaxin

## 2012-08-08 NOTE — Progress Notes (Signed)
Order received, chart reviewed, with plans for pt to D/C to SNF. Will defer eval to that facility. If eval needed for SNF, please let us know.  Ignacia Palma, [place] [phone], [phone] 08/08/2012

## 2012-08-08 NOTE — Progress Notes (Signed)
UR COMPLETED  

## 2012-08-08 NOTE — Progress Notes (Signed)
Pt unable to void since foley D/Cd at 1000 this am. Scanned bladder at this time. Scan showed 12 cc urine present in the bladder. Will push fluids and continue to monitor output.

## 2012-08-09 LAB — CBC
Hemoglobin: 10.4 g/dL — ABNORMAL LOW (ref 13.0–17.0)
MCH: 31.3 pg (ref 26.0–34.0)
MCV: 89.8 fL (ref 78.0–100.0)
Platelets: 141 10*3/uL — ABNORMAL LOW (ref 150–400)
RBC: 3.32 MIL/uL — ABNORMAL LOW (ref 4.22–5.81)

## 2012-08-09 MED ORDER — HYDROCODONE-ACETAMINOPHEN 5-325 MG PO TABS
1.0000 | ORAL_TABLET | ORAL | Status: DC | PRN
  Administered 2012-08-09: 2 via ORAL
  Administered 2012-08-10: 1 via ORAL
  Administered 2012-08-10 – 2012-08-12 (×4): 2 via ORAL
  Filled 2012-08-09 (×5): qty 2
  Filled 2012-08-09: qty 1

## 2012-08-09 MED ORDER — BUDESONIDE-FORMOTEROL FUMARATE 80-4.5 MCG/ACT IN AERO
2.0000 | INHALATION_SPRAY | Freq: Two times a day (BID) | RESPIRATORY_TRACT | Status: DC
  Administered 2012-08-09 – 2012-08-12 (×6): 2 via RESPIRATORY_TRACT
  Filled 2012-08-09: qty 6.9

## 2012-08-09 MED ORDER — HYDROCODONE-ACETAMINOPHEN 5-325 MG PO TABS: ORAL_TABLET | ORAL | Status: DC

## 2012-08-09 MED ORDER — TRAMADOL HCL 50 MG PO TABS: 50.0000 mg | ORAL_TABLET | Freq: Four times a day (QID) | ORAL | Status: DC | PRN

## 2012-08-09 NOTE — Clinical Social Work Placement (Addendum)
Clinical Social Work Department CLINICAL SOCIAL WORK PLACEMENT NOTE 08/09/2012  Patient:  John Stark, John Stark  Account Number:  [id number] Admit date:  08/07/2012  Clinical Social Worker:  Johnsie Cancel  Date/time:  08/09/2012 12:00 M  Clinical Social Work is seeking post-discharge placement for this patient at the following level of care:   SKILLED NURSING   (*CSW will update this form in Epic as items are completed)   08/09/2012  Patient/family provided with [facility] Department of Clinical Social Work's list of facilities offering this level of care within the geographic area requested by the patient (or if unable, by the patient's family).  08/09/2012  Patient/family informed of their freedom to choose among providers that offer the needed level of care, that participate in Medicare, Medicaid or managed care program needed by the patient, have an available bed and are willing to accept the patient.  08/09/2012  Patient/family informed of [facility]' ownership interest in [facility], as well as of the fact that they are under no obligation to receive care at this facility.  PASARR submitted to EDS on 08/12/2012 PASARR number received from EDS on 08/12/2012  FL2 transmitted to all facilities in geographic area requested by pt/family on  08/09/2012 FL2 transmitted to all facilities within larger geographic area on   Patient informed that his/her managed care company has contracts with or will negotiate with  certain facilities, including the following:     Patient/family informed of bed offers received:  08/09/2012 Patient chooses bed at [facility] Physician recommends and patient chooses bed at    Patient to be transferred to [facility] on  08/12/2012 Patient to be transferred to facility by [facility]  The following physician request were entered in Epic:   Additional Comments:

## 2012-08-09 NOTE — Progress Notes (Addendum)
Patient ID: John Stark, male   DOB: July 22, 1935, 77 y.o.   MRN: [id number] PATIENT ID: John Stark  MRN: [id number]  DOB/AGE:  12/23/1935 / 77 y.o.  2 Days Post-Op Procedure(s) (LRB): TOTAL KNEE ARTHROPLASTY (Right)    PROGRESS NOTE Subjective: Patient is alert, oriented, no Nausea, no Vomiting, yes passing gas, no Bowel Movement. Taking PO well. Denies SOB, Chest or Calf Pain. Using Incentive Spirometer, PAS in place. Ambulate WBAT in hallway, CPM 0-80 Patient reports pain as 3 on 0-10 scale  .    Objective: Vital signs in last 24 hours: Filed Vitals:   08/08/12 2151 08/09/12 0557 08/09/12 1328 08/09/12 1405  BP: 167/75 151/69 134/57   Pulse: 98 74 70   Temp: 98.4 F (36.9 C) 98.8 F (37.1 C) 98.4 F (36.9 C)   TempSrc: Oral     Resp: 20 18 20    Height:      Weight:      SpO2: 94% 95% 94% 94%      Intake/Output from previous day: I/O last 3 completed shifts: In: 1200 [P.O.:1200] Out: 3000 [Urine:2500; Drains:500]   Intake/Output this shift: Total I/O In: 600 [P.O.:600] Out: 375 [Urine:375]   LABORATORY DATA:  Recent Labs  08/08/12 0645 08/09/12 0830  WBC 10.2 12.3*  HGB 11.1* 10.4*  HCT 32.8* 29.8*  PLT 146* 141*  NA 138  --   K 4.0  --   CL 103  --   CO2 24  --   BUN 16  --   CREATININE 0.88  --   GLUCOSE 117*  --   CALCIUM 8.3*  --     Examination: Neurologically intact ABD soft Neurovascular intact Sensation intact distally Intact pulses distally Dorsiflexion/Plantar flexion intact Incision: no drainage No cellulitis present Compartment soft}  Assessment:   2 Days Post-Op Procedure(s) (LRB): TOTAL KNEE ARTHROPLASTY (Right) ADDITIONAL DIAGNOSIS:  DIASTOLIC HEART FAILURE, CHRONIC, HYPERLIPIDEMIA-MIXED, OBESITY, HYPERTENSION, GERD, PALPITATIONS, COPD    Plan: PT/OT WBAT, CPM 5/hrs day until ROM 0-90 degrees, then D/C CPM DVT Prophylaxis:  SCDx72hrs, Xarelto resumed from pre-op Symbicort Inhaler for COPD DISCHARGE PLAN: Skilled  Nursing Facility/Rehab, [facility] DISCHARGE NEEDS: HHPT, HHRN, CPM, Walker and 3-in-1 comode seat     Rudene Poulsen J 08/09/2012, 2:38 PM

## 2012-08-09 NOTE — Progress Notes (Addendum)
Subjective: 2 Days Post-Op Procedure(s) (LRB): TOTAL KNEE ARTHROPLASTY (Right) No confusion now  Activity level:  Walking with therapy Diet tolerance:  eating Voiding:  ok Patient reports pain as 2 on 0-10 scale.    Objective: Vital signs in last 24 hours: Temp:  [98.3 F (36.8 C)-98.8 F (37.1 C)] 98.8 F (37.1 C) (02/14 0557) Pulse Rate:  [66-98] 74 (02/14 0557) Resp:  [18-20] 18 (02/14 0557) BP: (84-167)/(50-75) 151/69 mmHg (02/14 0557) SpO2:  [93 %-95 %] 95 % (02/14 0557)  Labs:  Recent Labs  08/08/12 0645 08/09/12 0830  HGB 11.1* 10.4*    Recent Labs  08/08/12 0645 08/09/12 0830  WBC 10.2 12.3*  RBC 3.59* 3.32*  HCT 32.8* 29.8*  PLT 146* 141*    Recent Labs  08/08/12 0645  NA 138  K 4.0  CL 103  CO2 24  BUN 16  CREATININE 0.88  GLUCOSE 117*  CALCIUM 8.3*   No results found for this basename: LABPT, INR,  in the last 72 hours  Physical Exam:  Neurologically intact ABD soft Neurovascular intact Sensation intact distally Intact pulses distally Dorsiflexion/Plantar flexion intact Incision: dressing C/D/I No cellulitis present Compartment soft Dressing changed and wound wnl  Assessment/Plan:  2 Days Post-Op Procedure(s) (LRB): TOTAL KNEE ARTHROPLASTY (Right) Advance diet Up with therapy D/C IV fluids Discharge to SNF Sat if there is a bed Changed pain meds to see if it helps night time confusion Xarelto to prevent DVT   Margarita Croke R 08/09/2012, 11:56 AM

## 2012-08-09 NOTE — Progress Notes (Signed)
Physical Therapy Treatment Patient Details Name: John Stark MRN: [id number] DOB: December 25, 1935 Today's Date: 08/09/2012 Time: 1001-1030 PT Time Calculation (min): 29 min  PT Assessment / Plan / Recommendation Comments on Treatment Session  pt presents with [name].  pt cognition seems improved from reported deficits last night.  pt eager for mobility and continues to make progress.      Follow Up Recommendations  SNF     Does the patient have the potential to tolerate intense rehabilitation     Barriers to Discharge        Equipment Recommendations  Rolling walker with 5" wheels (3-in-1)    Recommendations for Other Services    Frequency 7X/week   Plan Discharge plan remains appropriate;Frequency remains appropriate    Precautions / Restrictions Precautions Precautions: Knee;Fall Restrictions Weight Bearing Restrictions: Yes RLE Weight Bearing: Weight bearing as tolerated   Pertinent Vitals/Pain Indicates pain as "tender" during mobility, with there ex notes pain increased and requested meds.  RN made aware.      Mobility  Bed Mobility Bed Mobility: Supine to Sit;Sitting - Scoot to Edge of Bed Supine to Sit: 4: Min assist;With rails Sitting - Scoot to Edge of Bed: 5: Supervision Details for Bed Mobility Assistance: A with R LE only.   Transfers Transfers: Sit to Stand;Stand to Sit Sit to Stand: 4: Min assist;With upper extremity assist;From bed;Other (comment) (Bench in hall) Stand to Sit: 4: Min assist;With upper extremity assist;Other (comment);To chair/3-in-1;With armrests Therapist, music in hall) Details for Transfer Assistance: cues for UE use, getting closer to chair prior to sitting, control descent.   Ambulation/Gait Ambulation/Gait Assistance: 4: Min guard Ambulation Distance (Feet): 100 Feet (x2) Assistive device: Rolling walker Ambulation/Gait Assistance Details: cues for upright posture, positioning in RW, gait sequencing.   Gait Pattern: Step-to  pattern;Decreased step length - left;Decreased stance time - right;Decreased stride length;Trunk flexed Stairs: No Wheelchair Mobility Wheelchair Mobility: No    Exercises Total Joint Exercises Ankle Circles/Pumps: AROM;Both;10 reps Quad Sets: AROM;Both;10 reps Long Arc Quad: AAROM;Right;10 reps Knee Flexion: AAROM;Right;10 reps   PT Diagnosis:    PT Problem List:   PT Treatment Interventions:     PT Goals Acute Rehab PT Goals Time For Goal Achievement: 08/15/12 Potential to Achieve Goals: Good PT Goal: Supine/Side to Sit - Progress: Progressing toward goal PT Goal: Sit to Stand - Progress: Progressing toward goal PT Goal: Stand to Sit - Progress: Progressing toward goal PT Goal: Ambulate - Progress: Progressing toward goal PT Goal: Perform Home Exercise Program - Progress: Progressing toward goal  Visit Information  Last PT Received On: 08/09/12 Assistance Needed: +1    Subjective Data  Subjective: I'm ready if you are.     Cognition  Cognition Overall Cognitive Status: Appears within functional limits for tasks assessed/performed Arousal/Alertness: Awake/alert Orientation Level: Appears intact for tasks assessed Behavior During Session: [facility] for tasks performed Cognition - Other Comments: Per Nsg and Family, pt had a lot of confusion overnight and has not had any pain meds or muscle relaxers since yesterday to help pt clear.  pt today seems almost back to cognition level of yesterday.      Balance  Balance Balance Assessed: No  End of Session PT - End of Session Equipment Utilized During Treatment: Gait belt Activity Tolerance: Patient tolerated treatment well Patient left: in chair;with call bell/phone within reach;with family/visitor present Nurse Communication: Mobility status   GP     John Stark, [place] [phone] 08/09/2012, 12:05 PM

## 2012-08-09 NOTE — Evaluation (Signed)
Occupational Therapy Evaluation Patient Details Name: John Stark MRN: [id number] DOB: 15-Jul-1935 Today's Date: 08/09/2012 Time: 4098-1191 OT Time Calculation (min): 29 min  OT Assessment / Plan / Recommendation Clinical Impression  This 77 yo male s/p RTKA presents to acute OT with problems below. Will benefit from acute OT with follow OT at SNF.    OT Assessment  Patient needs continued OT Services    Follow Up Recommendations  SNF    Barriers to Discharge None    Equipment Recommendations  None recommended by OT       Frequency  Min 2X/week    Precautions / Restrictions Precautions Precautions: Knee;Fall Restrictions Weight Bearing Restrictions: Yes RLE Weight Bearing: Weight bearing as tolerated   Pertinent Vitals/Pain 5/10 right knee    ADL  Eating/Feeding: Independent Where Assessed - Eating/Feeding: Chair Grooming: Set up Where Assessed - Grooming: Unsupported sitting Upper Body Bathing: Set up Where Assessed - Upper Body Bathing: Unsupported sitting Lower Body Bathing: Moderate assistance Where Assessed - Lower Body Bathing: Supported sit to stand Upper Body Dressing: Set up Where Assessed - Upper Body Dressing: Unsupported sitting Lower Body Dressing: Moderate assistance Where Assessed - Lower Body Dressing: Supported sit to Pharmacist, hospital: Minimal assistance Statistician Method: Sit to Barista: Comfort height toilet;Grab bars Toileting - Architect and Hygiene: Min guard Where Assessed - Engineer, mining and Hygiene: Standing Equipment Used: Rolling walker Transfers/Ambulation Related to ADLs: min A for all    OT Diagnosis: Generalized weakness  OT Problem List: Impaired balance (sitting and/or standing);Decreased activity tolerance;Decreased knowledge of use of DME or AE;Cardiopulmonary status limiting activity (DOE) OT Treatment Interventions: Self-care/ADL training;DME and/or AE  instruction;Patient/family education;Balance training   OT Goals Acute Rehab OT Goals OT Goal Formulation: With patient Time For Goal Achievement: 08/16/12 Potential to Achieve Goals: Good ADL Goals Pt Will Perform Grooming: with set-up;with supervision;Unsupported;Standing at sink ADL Goal: Grooming - Progress: Goal set today Pt Will Perform Lower Body Dressing: with set-up;with supervision;Sit to stand from bed;Sit to stand from chair;Unsupported ADL Goal: Lower Body Dressing - Progress: Goal set today Pt Will Transfer to Toilet: with supervision;Ambulation;Comfort height toilet;Grab bars ADL Goal: Toilet Transfer - Progress: Goal set today Pt Will Perform Toileting - Clothing Manipulation: Independently;Standing ADL Goal: Toileting - Clothing Manipulation - Progress: Goal set today Pt Will Perform Toileting - Hygiene: Independently;Sit to stand from 3-in-1/toilet ADL Goal: Toileting - Hygiene - Progress: Goal set today  Visit Information  Last OT Received On: 08/09/12 Assistance Needed: +1    Subjective Data  Subjective: I've been in this machine over an hour (CPM)   Prior Functioning     Home Living Lives With: Spouse Available Help at Discharge: Skilled Nursing Facility Additional Comments: pt plans to D/C to SNF prior to return home.   Prior Function Level of Independence: Independent with assistive device(s) Able to Take Stairs?: Yes Driving: Yes Vocation: Retired Musician: No difficulties Dominant Hand: Right            Cognition  Cognition Overall Cognitive Status: Appears within functional limits for tasks assessed/performed Arousal/Alertness: Awake/alert Orientation Level: Appears intact for tasks assessed Behavior During Session: [facility] for tasks performed Cognition - Other Comments: Per Nsg and Family, pt had a lot of confusion overnight and has not had any pain meds or muscle relaxers since yesterday to help pt clear.  pt today  seems almost back to cognition level of yesterday.      Extremity/Trunk Assessment Right  Upper Extremity Assessment RUE ROM/Strength/Tone: Within functional levels Left Upper Extremity Assessment LUE ROM/Strength/Tone: Within functional levels     Mobility Bed Mobility Bed Mobility: Supine to Sit;Sitting - Scoot to Edge of Bed Supine to Sit: 4: Min guard;With rails;HOB elevated (30 degrees) Sitting - Scoot to Edge of Bed: 5: Supervision Details for Bed Mobility Assistance: A with R LE only.   Transfers Transfers: Sit to Stand;Stand to Sit Sit to Stand: 4: Min assist;With upper extremity assist;From bed Stand to Sit: 4: Min assist;With upper extremity assist;With armrests;To chair/3-in-1 Details for Transfer Assistance: VCs for positioning of RLE for sit to stand and stand to sit           End of Session OT - End of Session Activity Tolerance: Patient tolerated treatment well Patient left: in chair;with call bell/phone within reach;with chair alarm set Nurse Communication:  (Pt asking for inhaler)       Evette Georges [phone] 08/09/2012, 2:28 PM

## 2012-08-09 NOTE — Progress Notes (Signed)
Patient experiencing confusion overnight.  Family concerned it may be related to medication he had received earlier during the day and prior during his hospital stay.  Patient preoccupied with urinating, trying to get out of the bed, itching at skin and pulling at his hospital gown and IV line.  Benadryl given with positive effect.  Patient with spotaneous void of at this time.  Patient later still complaining of feeling as if he had to urinate, bladder scan completed, showing 150 mL, patient still anxious, I+O cath performed, 250 ml urine produced.  Patient states feeling of relief.  Patient continuing to still be anxious on and off throughout the night regarding urinary status, but continued to produced spontaneous amounts of urine throughout the night without issue.  Will continue to monitor.

## 2012-08-09 NOTE — Clinical Social Work Psychosocial (Signed)
Clinical Social Work Department BRIEF PSYCHOSOCIAL ASSESSMENT 08/09/2012  Patient:  John Stark, John Stark     Account Number:  [id number]     Admit date:  08/07/2012  Clinical Social Worker:  Johnsie Cancel  Date/Time:  08/09/2012 12:40 PM  Referred by:  Physician  Date Referred:  08/07/2012 Referred for  SNF Placement   Other Referral:   Interview type:  Patient Other interview type:   Wife    PSYCHOSOCIAL DATA Living Status:  WIFE Admitted from facility:   Level of care:   Primary support name:  Okey Regal [phone]) Primary support relationship to patient:  SPOUSE Degree of support available:   Adequate, at patient's bedside.    CURRENT CONCERNS Current Concerns  Post-Acute Placement   Other Concerns:    SOCIAL WORK ASSESSMENT / PLAN CSW consulted re: SNF placement for PT. CSW met with patient and wife at bedside. Patient stated [facility] #1 choice is [facility] due to location but agreed to an open [facility] as a back up. CSW provided support on recovery process and provided education on SNFs. CSW will continue to follow patient for d/c needs.   Assessment/plan status:  Information/Referral to [facility] Other assessment/ plan:   Information/referral to community resources:    PATIENT'S/FAMILY'S RESPONSE TO PLAN OF CARE: Patient and spoused thanked CSW for providing support.    Lia Foyer, LCSWA [facility] Clinical Social Worker Contact #: [phone]

## 2012-08-10 ENCOUNTER — Encounter (HOSPITAL_COMMUNITY): Payer: Self-pay | Admitting: Orthopedic Surgery

## 2012-08-10 LAB — CBC
HCT: 29.9 % — ABNORMAL LOW (ref 39.0–52.0)
MCH: 30.2 pg (ref 26.0–34.0)
MCHC: 33.8 g/dL (ref 30.0–36.0)
MCV: 89.5 fL (ref 78.0–100.0)
RDW: 14.3 % (ref 11.5–15.5)

## 2012-08-10 NOTE — Progress Notes (Signed)
Subjective: 3 Days Post-Op Procedure(s) (LRB): TOTAL KNEE ARTHROPLASTY (Right)  Activity level:  wbat Diet tolerance:  ok Voiding:  ok Patient reports pain as 3 on 0-10 scale.    Objective: Vital signs in last 24 hours: Temp:  [98.2 F (36.8 C)-98.9 F (37.2 C)] 98.9 F (37.2 C) (02/15 0530) Pulse Rate:  [70-85] 71 (02/15 0530) Resp:  [18-20] 18 (02/15 0530) BP: (124-150)/(55-89) 150/89 mmHg (02/15 0530) SpO2:  [91 %-99 %] 91 % (02/15 0743)  Labs:  Recent Labs  08/08/12 0645 08/09/12 0830 08/10/12 0725  HGB 11.1* 10.4* 10.1*    Recent Labs  08/09/12 0830 08/10/12 0725  WBC 12.3* 12.0*  RBC 3.32* 3.34*  HCT 29.8* 29.9*  PLT 141* 148*    Recent Labs  08/08/12 0645  NA 138  K 4.0  CL 103  CO2 24  BUN 16  CREATININE 0.88  GLUCOSE 117*  CALCIUM 8.3*   No results found for this basename: LABPT, INR,  in the last 72 hours  Physical Exam:  Neurologically intact ABD soft Neurovascular intact Sensation intact distally Intact pulses distally Dorsiflexion/Plantar flexion intact Incision: dressing C/D/I No cellulitis present Compartment soft  Assessment/Plan:  3 Days Post-Op Procedure(s) (LRB): TOTAL KNEE ARTHROPLASTY (Right) Advance diet Up with therapy D/C IV fluids Discharge to [facility] Monday when bed ready Cont xarelto    Walter Min R 08/10/2012, 8:50 AM

## 2012-08-10 NOTE — Progress Notes (Signed)
CSW contacted Jasmine December [phone] at [facility] to check the status of the authorization sent by CSW on 08/09/12.  I received Sharon's voice mail that stated she would be off until Tuesday that Cedric would be covering for her until then.  CSW called Cedric at [phone] and left vm message for Cedric re: blue medicare authorization.  CSW will continue to f/u.  Vickii Penna, LCSWA [phone] Clinical Social Work

## 2012-08-10 NOTE — Progress Notes (Signed)
Seen and agreed 08/10/2012 Robinette, Julia Elizabeth PTA [phone] pager [phone] office    

## 2012-08-10 NOTE — Progress Notes (Signed)
Physical Therapy Treatment Patient Details Name: John Stark MRN: [id number] DOB: [birth year] Today's Date: 08/10/2012 Time: 1030-1053 PT Time Calculation (min): 23 min  PT Assessment / Plan / Recommendation Comments on Treatment Session  Pt with improved mobility this visit. Sitting EOB with family wanting to transfer to chair. Increased pain and fatigue with ambulation. Pain meds received during treatment. Added exercises and provided handout. Encouraged pt to go through exercises at least one more time today. Will continue to encourage pt to increase RLE WBing and progress as tolerable.     Follow Up Recommendations  SNF     Does the patient have the potential to tolerate intense rehabilitation     Barriers to Discharge        Equipment Recommendations  Rolling walker with 5" wheels    Recommendations for Other Services    Frequency 7X/week   Plan Discharge plan remains appropriate;Frequency remains appropriate    Precautions / Restrictions Precautions Precautions: Knee;Fall Restrictions Weight Bearing Restrictions: Yes RLE Weight Bearing: Weight bearing as tolerated   Pertinent Vitals/Pain 5/10 with activity. Medication received during treatment.    Mobility  Bed Mobility Details for Bed Mobility Assistance: Upon entering, pt sitting EOB talking with family Transfers Sit to Stand: With upper extremity assist;From bed;4: Min assist Stand to Sit: 4: Min guard;With upper extremity assist;To chair/3-in-1 Details for Transfer Assistance: Cueing for UE use to push up to stand Ambulation/Gait Ambulation/Gait Assistance: 4: Min guard Ambulation Distance (Feet): 100 Feet Assistive device: Rolling walker Ambulation/Gait Assistance Details: cueing for posture Gait Pattern: Step-to pattern;Decreased step length - left;Decreased stance time - right;Decreased stride length;Trunk flexed General Gait Details: Pt c/o of UE fatigue  after 50 ft needing to turn around. Encouraged  pt to increase WBing in LE.     Exercises Total Joint Exercises Quad Sets: AROM;Both;10 reps Towel Squeeze: AROM;Both;15 reps Short Arc QuadBarbaraann Boys;Right;10 reps Heel Slides: AROM;Right;10 reps Hip ABduction/ADduction: AAROM;Right;10 reps Long Arc Quad: AROM;Right;15 reps Knee Flexion: AAROM;Right;15 reps   PT Diagnosis:    PT Problem List:   PT Treatment Interventions:     PT Goals Acute Rehab PT Goals PT Goal: Sit to Stand - Progress: Progressing toward goal PT Goal: Stand to Sit - Progress: Progressing toward goal PT Goal: Ambulate - Progress: Progressing toward goal PT Goal: Perform Home Exercise Program - Progress: Progressing toward goal  Visit Information  Last PT Received On: 08/10/12 Assistance Needed: +1    Subjective Data  Subjective: I'm more tired than painful.   Cognition  Cognition Overall Cognitive Status: Appears within functional limits for tasks assessed/performed Arousal/Alertness: Awake/alert Orientation Level: Appears intact for tasks assessed Behavior During Session: [facility] for tasks performed    Balance     End of Session PT - End of Session Equipment Utilized During Treatment: Gait belt Activity Tolerance: Patient tolerated treatment well Patient left: in chair;with call bell/phone within reach;with family/visitor present CPM Right Knee CPM Right Knee: Off   GP     Lazaro Arms 08/10/2012, 12:54 PM

## 2012-08-11 NOTE — Progress Notes (Signed)
Subjective: 4 Days Post-Op Procedure(s) (LRB): TOTAL KNEE ARTHROPLASTY (Right) improving Activity level:  oob wbat Diet tolerance:  ok Voiding:  ok Patient reports pain as 2 on 0-10 scale.    Objective: Vital signs in last 24 hours: Temp:  [97.5 F (36.4 C)-99 F (37.2 C)] 97.5 F (36.4 C) (02/16 0604) Pulse Rate:  [57-73] 61 (02/16 1004) Resp:  [18-20] 18 (02/16 0751) BP: (106-125)/(52-69) 125/59 mmHg (02/16 1004) SpO2:  [95 %-97 %] 97 % (02/16 0806)  Labs:  Recent Labs  08/09/12 0830 08/10/12 0725  HGB 10.4* 10.1*    Recent Labs  08/09/12 0830 08/10/12 0725  WBC 12.3* 12.0*  RBC 3.32* 3.34*  HCT 29.8* 29.9*  PLT 141* 148*   No results found for this basename: NA, K, CL, CO2, BUN, CREATININE, GLUCOSE, CALCIUM,  in the last 72 hours No results found for this basename: LABPT, INR,  in the last 72 hours  Physical Exam:  Neurologically intact ABD soft Neurovascular intact Sensation intact distally Intact pulses distally Dorsiflexion/Plantar flexion intact No cellulitis present Compartment soft  Assessment/Plan:  4 Days Post-Op Procedure(s) (LRB): TOTAL KNEE ARTHROPLASTY (Right) Advance diet Up with therapy D/C IV fluids Discharge to SNF on Mon Xarelto to prevent DVT RX's on the chart    Jasmine Mcbeth R 08/11/2012, 12:10 PM

## 2012-08-11 NOTE — Progress Notes (Signed)
Physical Therapy Treatment Patient Details Name: John Stark MRN: [id number] DOB: 09-18-1935 Today's Date: 08/11/2012 Time: 1478-2956 PT Time Calculation (min): 24 min  PT Assessment / Plan / Recommendation Comments on Treatment Session  Increased gait distance noted.  D/C plan is to SNF tomorrow.    Follow Up Recommendations  SNF     Does the patient have the potential to tolerate intense rehabilitation     Barriers to Discharge        Equipment Recommendations  Rolling walker with 5" wheels    Recommendations for Other Services    Frequency 7X/week   Plan Discharge plan remains appropriate;Frequency remains appropriate    Precautions / Restrictions Precautions Precautions: Knee Restrictions RLE Weight Bearing: Weight bearing as tolerated   Pertinent Vitals/Pain 3/10    Mobility  Bed Mobility Bed Mobility: Sit to Supine Supine to Sit: 4: Min guard;HOB elevated;With rails Sit to Supine: 4: Min guard;With rail Transfers Sit to Stand: 4: Min guard;With upper extremity assist;From bed Stand to Sit: 4: Min guard;To bed;With upper extremity assist Ambulation/Gait Ambulation/Gait Assistance: 4: Min guard Ambulation Distance (Feet): 200 Feet Assistive device: Rolling walker Gait Pattern: Step-to pattern Gait velocity: decreased    Exercises Total Joint Exercises Ankle Circles/Pumps: AROM;Both;10 reps Quad Sets: AROM;Right;10 reps Heel Slides: AROM;Right;10 reps Hip ABduction/ADduction: AROM;Right;10 reps   PT Diagnosis:    PT Problem List:   PT Treatment Interventions:     PT Goals Acute Rehab PT Goals PT Goal: Supine/Side to Sit - Progress: Progressing toward goal PT Goal: Sit to Supine/Side - Progress: Progressing toward goal PT Goal: Sit to Stand - Progress: Progressing toward goal PT Goal: Stand to Sit - Progress: Progressing toward goal PT Goal: Ambulate - Progress: Progressing toward goal PT Goal: Up/Down Stairs - Progress: Progressing toward  goal PT Goal: Perform Home Exercise Program - Progress: Progressing toward goal  Visit Information  Last PT Received On: 08/11/12 Assistance Needed: +1    Subjective Data  Subjective: "I am feeling good."   Cognition  Cognition Overall Cognitive Status: Appears within functional limits for tasks assessed/performed Arousal/Alertness: Awake/alert Orientation Level: Oriented X4 / Intact Behavior During Session: [facility] for tasks performed    Balance     End of Session PT - End of Session Equipment Utilized During Treatment: Gait belt Activity Tolerance: Patient tolerated treatment well Patient left: in bed;with call bell/phone within reach CPM Right Knee CPM Right Knee: On Right Knee Flexion (Degrees): 65 Right Knee Extension (Degrees): 0   GP     Ilda Foil 08/11/2012, 2:19 PM  Aida Raider, PT  Office # [phone] Pager [phone]

## 2012-08-12 ENCOUNTER — Encounter (HOSPITAL_COMMUNITY): Payer: Self-pay | Admitting: Orthopedic Surgery

## 2012-08-12 NOTE — Progress Notes (Signed)
Medication list reviewed with patient. All questions answered, and patient informed of medications that he needs to take this evening.  Report called to [facility]. Rn spoke with [place] and gave report using [facility].

## 2012-08-12 NOTE — Discharge Summary (Signed)
Patient ID: John Stark MRN: [id number] DOB/AGE: January 16, 1936 77 y.o.  Admit date: 08/07/2012 Discharge date: 08/12/2012  Admission Diagnoses:  Principal Problem:   Osteoarthritis of right knee   Discharge Diagnoses:  Same  Past Medical History  Diagnosis Date  . Atrial fibrillation     a. Echo 05/27/12:  Mild LVH, EF 55%, aortic sclerosis, no AS, mild LAE, mild RVE, moderate RV dysfunction, mild RAE, PASP 32;  b. Xarelto started 05/22/12  . Overweight   . HTN (hypertension)   . HLD (hyperlipidemia)   . GERD (gastroesophageal reflux disease)   . Osteoarthrosis, unspecified whether generalized or localized, unspecified site   . Asthma     "as a child"  . Right knee pain     awaiting knee replacement  . COPD (chronic obstructive pulmonary disease)   . Enlarged prostate   . Hearing loss in right ear     Surgeries: Procedure(s): TOTAL KNEE ARTHROPLASTY on 08/07/2012   Consultants:    Discharged Condition: Improved  Hospital Course: EZRI LANDERS is an 77 y.o. male who was admitted 08/07/2012 for operative treatment ofOsteoarthritis of right knee. Patient has severe unremitting pain that affects sleep, daily activities, and work/hobbies. After pre-op clearance the patient was taken to the operating room on 08/07/2012 and underwent  Procedure(s): TOTAL KNEE ARTHROPLASTY.    Patient was given perioperative antibiotics: Anti-infectives   Start     Dose/Rate Route Frequency Ordered Stop   08/07/12 1252  cefUROXime (ZINACEF) injection  Status:  Discontinued       As needed 08/07/12 1253 08/07/12 1330   08/07/12 0600  ceFAZolin (ANCEF) 3 g in dextrose 5 % 50 mL IVPB     3 g 160 mL/hr over 30 Minutes Intravenous On call to O.R. 08/06/12 1436 08/07/12 1127       Patient was given sequential compression devices, early ambulation, and chemoprophylaxis to prevent DVT.  Patient benefited maximally from hospital stay and there were no complications.    Recent vital signs:  Patient Vitals for the past 24 hrs:  BP Temp Temp src Pulse Resp SpO2  08/12/12 0600 131/70 mmHg 98.3 F (36.8 C) Oral 60 18 95 %  08/11/12 2045 129/66 mmHg 98.3 F (36.8 C) Oral 60 18 94 %  08/11/12 1921 - - - - - 96 %  08/11/12 1600 - - - - 18 95 %  08/11/12 1400 110/41 mmHg 97.9 F (36.6 C) Oral 58 18 95 %  08/11/12 1200 - - - - 17 96 %  08/11/12 1004 125/59 mmHg - - 61 - -  08/11/12 0806 - - - - - 97 %  08/11/12 0751 - - - - 18 97 %     Recent laboratory studies:  Recent Labs  08/09/12 0830 08/10/12 0725  WBC 12.3* 12.0*  HGB 10.4* 10.1*  HCT 29.8* 29.9*  PLT 141* 148*     Discharge Medications:     Medication List    STOP taking these medications       MOBIC 7.5 MG tablet  Generic drug:  meloxicam      TAKE these medications       amLODipine 5 MG tablet  Commonly known as:  NORVASC  Take 5 mg by mouth daily.     benazepril 10 MG tablet  Commonly known as:  LOTENSIN  Take 10 mg by mouth daily.     CARDURA 4 MG tablet  Generic drug:  doxazosin  Take 4 mg by mouth daily.  famotidine 20 MG tablet  Commonly known as:  PEPCID  Take 1 tablet (20 mg total) by mouth daily.     furosemide 20 MG tablet  Commonly known as:  LASIX  Take 20 mg by mouth daily.     HYDROcodone-acetaminophen 5-325 MG per tablet  Commonly known as:  NORCO/VICODIN  1 or 2 pill prn q 4-6 For pain     JUICE PLUS FIBRE PO  Take 6 tablets by mouth daily. 2 berry tablets, 2 fruit tablets, and 2 veggie tablets     loratadine 10 MG tablet  Commonly known as:  CLARITIN  Take 10 mg by mouth daily as needed. For allergies     metoprolol 50 MG tablet  Commonly known as:  LOPRESSOR  Take 1 tablet (50 mg total) by mouth 2 (two) times daily.     potassium chloride 10 MEQ CR capsule  Commonly known as:  MICRO-K  Take 10 mEq by mouth daily.     PROSCAR 5 MG tablet  Generic drug:  finasteride  Take 5 mg by mouth daily.     Rivaroxaban 20 MG Tabs  Commonly known as:  XARELTO   Take 1 tablet (20 mg total) by mouth daily.     traMADol 50 MG tablet  Commonly known as:  ULTRAM  Take 1 tablet (50 mg total) by mouth every 6 (six) hours as needed. For pain        Diagnostic Studies: No results found.  Disposition: 01-Home or Self Care      Discharge Orders   Future Appointments Provider Department Dept Phone   10/15/2012 10:30 AM Kathleene Hazel, MD [facility] Main Office [place]) [phone]   Future Orders Complete By Expires     Call MD for:  redness, tenderness, or signs of infection (pain, swelling, redness, odor or green/yellow discharge around incision site)  As directed     Call MD for:  severe uncontrolled pain  As directed     Call MD for:  temperature >100.4  As directed     Change dressing (specify)  As directed     Comments:      Dressing change as needed.    Discharge instructions  As directed     Comments:      F/U with Dr. Turner Daniels as scheduled.    Driving Restrictions  As directed     Comments:      No driving for 2 weeks.    Increase activity slowly  As directed     May shower / Bathe  As directed     Walker   As directed        Follow-up Information   Follow up with Nestor Lewandowsky, MD In 2 weeks.   Contact information:   [address] [place] [address] [phone]        Signed: Hazle Nordmann. 08/12/2012, 7:33 AM

## 2012-08-12 NOTE — Progress Notes (Signed)
PATIENT ID: John Stark  MRN: [id number]  DOB/AGE:  [birth year] / 77 y.o.  5 Days Post-Op Procedure(s) (LRB): TOTAL KNEE ARTHROPLASTY (Right)    PROGRESS NOTE Subjective: Patient is alert, oriented, no Nausea, no Vomiting, yes passing gas, yes Bowel Movement. Taking PO well. Denies SOB, Chest or Calf Pain. Using Incentive Spirometer, PAS in place. Ambulating slowly with PT. Patient reports pain as mild  .    Objective: Vital signs in last 24 hours: Filed Vitals:   08/11/12 1600 08/11/12 1921 08/11/12 2045 08/12/12 0600  BP:   129/66 131/70  Pulse:   60 60  Temp:   98.3 F (36.8 C) 98.3 F (36.8 C)  TempSrc:   Oral Oral  Resp: 18  18 18   Height:      Weight:      SpO2: 95% 96% 94% 95%      Intake/Output from previous day: I/O last 3 completed shifts: In: 1160 [P.O.:1160] Out: 2000 [Urine:2000]   Intake/Output this shift:     LABORATORY DATA:  Recent Labs  08/09/12 0830 08/10/12 0725  WBC 12.3* 12.0*  HGB 10.4* 10.1*  HCT 29.8* 29.9*  PLT 141* 148*    Examination: Neurologically intact ABD soft Neurovascular intact Sensation intact distally Intact pulses distally Dorsiflexion/Plantar flexion intact Incision: dressing C/D/I}  Assessment:   5 Days Post-Op Procedure(s) (LRB): TOTAL KNEE ARTHROPLASTY (Right) ADDITIONAL DIAGNOSIS:  none  Plan: PT/OT WBAT, CPM 5/hrs day until ROM 0-90 degrees, then D/C CPM DVT Prophylaxis:  SCDx72hrs, ASA 325 mg BID x 2 weeks DISCHARGE PLAN: Skilled Nursing Facility/Rehab today DISCHARGE NEEDS: HHPT, HHRN, CPM, Walker and 3-in-1 comode seat     Jamilia Jacques M. 08/12/2012, 7:30 AM

## 2012-08-12 NOTE — Progress Notes (Signed)
Physical Therapy Treatment Patient Details Name: John Stark MRN: [id number] DOB: 1936/06/06 Today's Date: 08/12/2012 Time: 9629-5284 PT Time Calculation (min): 25 min  PT Assessment / Plan / Recommendation Comments on Treatment Session  pt rpesents with [name].  pt motivated to D/C to rehab and continue mobility.      Follow Up Recommendations  SNF     Does the patient have the potential to tolerate intense rehabilitation     Barriers to Discharge        Equipment Recommendations  Rolling walker with 5" wheels    Recommendations for Other Services    Frequency 7X/week   Plan Discharge plan remains appropriate;Frequency remains appropriate    Precautions / Restrictions Precautions Precautions: Knee Restrictions Weight Bearing Restrictions: Yes RLE Weight Bearing: Weight bearing as tolerated   Pertinent Vitals/Pain Indicates tightness superior to knee.  LEs elevated and applied Ice.      Mobility  Bed Mobility Bed Mobility: Not assessed Transfers Transfers: Sit to Stand;Stand to Sit Sit to Stand: 4: Min guard;With upper extremity assist;From chair/3-in-1;With armrests Stand to Sit: 4: Min guard;With upper extremity assist;To chair/3-in-1;With armrests Details for Transfer Assistance: demos good use of UEs.  Cues to get closer to chair prior to sitting and control descent.   Ambulation/Gait Ambulation/Gait Assistance: 4: Min guard Ambulation Distance (Feet): 200 Feet Assistive device: Rolling walker Ambulation/Gait Assistance Details: cues for upright posture, increased WBing on R LE.   Gait Pattern: Step-to pattern;Decreased step length - left;Decreased stance time - right Stairs: No Wheelchair Mobility Wheelchair Mobility: No    Exercises Total Joint Exercises Quad Sets: AROM;Right;10 reps Long Arc Quad: AROM;Right;15 reps Knee Flexion: AROM;Right;15 reps   PT Diagnosis:    PT Problem List:   PT Treatment Interventions:     PT Goals Acute Rehab PT  Goals Time For Goal Achievement: 08/15/12 Potential to Achieve Goals: Good PT Goal: Sit to Stand - Progress: Progressing toward goal PT Goal: Stand to Sit - Progress: Progressing toward goal PT Goal: Ambulate - Progress: Progressing toward goal PT Goal: Perform Home Exercise Program - Progress: Progressing toward goal  Visit Information  Last PT Received On: 08/12/12 Assistance Needed: +1    Subjective Data  Subjective: They say I'm supposed to leave today, but I don't know when.     Cognition  Cognition Overall Cognitive Status: Appears within functional limits for tasks assessed/performed Arousal/Alertness: Awake/alert Orientation Level: Oriented X4 / Intact Behavior During Session: [facility] for tasks performed    Balance  Balance Balance Assessed: No  End of Session PT - End of Session Equipment Utilized During Treatment: Gait belt Activity Tolerance: Patient tolerated treatment well Patient left: in chair;with call bell/phone within reach;with family/visitor present Nurse Communication: Mobility status   GP     [name], [place] [phone] 08/12/2012, 1:57 PM

## 2012-08-12 NOTE — Clinical Social Work Note (Signed)
CSW was consulted to complete discharge of patient. Pt to transfer to [facility] today via [facility]. Facility and family are aware of d/c. D/C packet complete with chart copy, signed FL2, and signed hard Rx.  CSW signing off as no other CSW needs identified at this time.  Tri Chittick, LCSWA [facility] Clinical Social Worker Contact #: [phone]  

## 2012-09-18 ENCOUNTER — Other Ambulatory Visit: Payer: Self-pay | Admitting: Cardiology

## 2012-09-18 MED ORDER — PRAVASTATIN SODIUM 40 MG PO TABS: 40.0000 mg | ORAL_TABLET | Freq: Every day | ORAL | Status: DC

## 2012-10-15 ENCOUNTER — Encounter: Payer: Self-pay | Admitting: Cardiovascular Disease

## 2012-10-15 ENCOUNTER — Ambulatory Visit (INDEPENDENT_AMBULATORY_CARE_PROVIDER_SITE_OTHER): Payer: Medicare Other | Admitting: Cardiovascular Disease

## 2012-10-15 VITALS — BP 148/90 | HR 66 | Ht 71.0 in | Wt 288.0 lb

## 2012-10-15 DIAGNOSIS — I4891 Unspecified atrial fibrillation: Secondary | ICD-10-CM

## 2012-10-15 MED ORDER — METOPROLOL TARTRATE 50 MG PO TABS: 50.0000 mg | ORAL_TABLET | Freq: Two times a day (BID) | ORAL | Status: DC

## 2012-10-15 MED ORDER — FAMOTIDINE 20 MG PO TABS: 20.0000 mg | ORAL_TABLET | Freq: Every day | ORAL | Status: DC

## 2012-10-15 NOTE — Progress Notes (Signed)
History of Present Illness: [name] is a pleasant 77 year old Caucasian male with past medical history significant for atrial fibrillation, hyperlipidemia, hypertension, BPH, GERD, COPD and arthritis who was seen as a new patient in our office in 2010 for evaluation of an abnormal echo. HIs echo showed normal left ventricular systolic function with mild LVH and mild diastolic dysfunction. Otherwise, the left atrium was mildly dilated. There was trace mitral regurgitation. The aortic valve was calcified, but had no evidence of stenosis. There were no other significant valvular abnormalities noted. He was seen in our office 05/22/12 after EKG in pre-op before knee surgery showed atrial fibrillation with HR of 119 bpm. He was started on Xarelto and Lopressor. Echo 05/27/12 with normal LV size and function. Successful DCCV 06/18/12. Right knee replacement 08/07/12 without complications.   He is here today for follow up. He has had no episodes of chest pain, shortness of breath, palpitations, or dizziness with exercise. His right knee is still sore but feeling better.   Primary Care Physician: Lupe Carney  Past Medical History  Diagnosis Date  . Atrial fibrillation     a. Echo 05/27/12:  Mild LVH, EF 55%, aortic sclerosis, no AS, mild LAE, mild RVE, moderate RV dysfunction, mild RAE, PASP 32;  b. Xarelto started 05/22/12  . Overweight   . HTN (hypertension)   . HLD (hyperlipidemia)   . GERD (gastroesophageal reflux disease)   . Osteoarthrosis, unspecified whether generalized or localized, unspecified site   . Asthma     "as a child"  . Right knee pain     awaiting knee replacement  . COPD (chronic obstructive pulmonary disease)   . Enlarged prostate   . Hearing loss in right ear     Past Surgical History  Procedure Laterality Date  . Back surgery      2011  . Cardioversion  06/18/2012    Procedure: CARDIOVERSION;  Surgeon: Wendall Stade, MD;  Location: [facility] ENDOSCOPY;  Service:  Cardiovascular;  Laterality: N/A;  . Total knee arthroplasty Right 08/07/2012    Procedure: TOTAL KNEE ARTHROPLASTY;  Surgeon: Nestor Lewandowsky, MD;  Location: [facility] OR;  Service: Orthopedics;  Laterality: Right;    Current Outpatient Prescriptions  Medication Sig Dispense Refill  . amLODipine (NORVASC) 5 MG tablet Take 5 mg by mouth daily.      . benazepril (LOTENSIN) 10 MG tablet Take 10 mg by mouth daily.      Marland Kitchen doxazosin (CARDURA) 4 MG tablet Take 4 mg by mouth daily.        . famotidine (PEPCID) 20 MG tablet Take 1 tablet (20 mg total) by mouth daily.  30 tablet  6  . finasteride (PROSCAR) 5 MG tablet Take 5 mg by mouth daily.        . furosemide (LASIX) 20 MG tablet Take 20 mg by mouth daily.        Marland Kitchen HYDROcodone-acetaminophen (NORCO/VICODIN) 5-325 MG per tablet 1 or 2 pill prn q 4-6 For pain  60 tablet  0  . loratadine (CLARITIN) 10 MG tablet Take 10 mg by mouth daily as needed. For allergies      . metoprolol (LOPRESSOR) 50 MG tablet Take 1 tablet (50 mg total) by mouth 2 (two) times daily.  60 tablet  6  . Nutritional Supplements (JUICE PLUS FIBRE PO) Take 6 tablets by mouth daily. 2 berry tablets, 2 fruit tablets, and 2 veggie tablets      . potassium chloride (MICRO-K) 10 MEQ CR  capsule Take 10 mEq by mouth daily.       . pravastatin (PRAVACHOL) 40 MG tablet Take 1 tablet (40 mg total) by mouth daily.  30 tablet  5  . Rivaroxaban (XARELTO) 20 MG TABS Take 1 tablet (20 mg total) by mouth daily.  30 tablet  6  . traMADol (ULTRAM) 50 MG tablet Take 1 tablet (50 mg total) by mouth every 6 (six) hours as needed. For pain  30 tablet  1   No current facility-administered medications for this visit.    No Known Allergies  History   Social History  . Marital Status: Married    Spouse Name: N/A    Number of Children: 2  . Years of Education: N/A   Occupational History  . Retired-purchasing Production designer, theatre/television/film [employer]    Social History Main Topics  . Smoking status: Former Smoker -- 1.00 packs/day  for 20 years    Types: Cigarettes    Quit date: 05/22/1977  . Smokeless tobacco: Not on file     Comment: Quit 1978  . Alcohol Use: No  . Drug Use: No  . Sexually Active: Not on file   Other Topics Concern  . Not on file   Social History Narrative   Retired. Married. Regularly exercises.     Family History  Problem Relation Age of Onset  . Cancer Other   . Heart attack Brother 75    Review of Systems:  As stated in the HPI and otherwise negative.   BP 148/90  Pulse 66  Ht 5\' 11"  (1.803 m)  Wt 288 lb (130.636 kg)  BMI 40.19 kg/m2  Physical Examination: General: Well developed, well nourished, NAD HEENT: OP clear, mucus membranes moist SKIN: warm, dry. No rashes. Neuro: No focal deficits Musculoskeletal: Muscle strength 5/5 all ext Psychiatric: Mood and affect normal Neck: No JVD, no carotid bruits, no thyromegaly, no lymphadenopathy. Lungs:Clear bilaterally, no wheezes, rhonci, crackles Cardiovascular: Regular rate and rhythm. No murmurs, gallops or rubs. Abdomen:Soft. Bowel sounds present. Non-tender.  Extremities: No lower extremity edema. Pulses are 2 + in the bilateral DP/PT.  Assessment and Plan:   1. Atrial fibrillation: Appears to be in sinus today. Continue anti-coagulation with Xarelto. Continue beta blocker. F/U 6 months.

## 2012-10-15 NOTE — Patient Instructions (Signed)
Your physician wants you to follow-up in:  6 months. You will receive a reminder letter in the mail two months in advance. If you don't receive a letter, please call our office to schedule the follow-up appointment.   

## 2013-01-30 ENCOUNTER — Encounter: Payer: Self-pay | Admitting: Cardiovascular Disease

## 2013-04-16 ENCOUNTER — Ambulatory Visit: Payer: Medicare Other | Admitting: Cardiovascular Disease

## 2013-04-18 ENCOUNTER — Other Ambulatory Visit: Payer: Self-pay

## 2013-04-18 DIAGNOSIS — I4891 Unspecified atrial fibrillation: Secondary | ICD-10-CM

## 2013-04-18 MED ORDER — RIVAROXABAN 20 MG PO TABS: 20.0000 mg | ORAL_TABLET | Freq: Every day | ORAL | Status: DC

## 2013-04-21 ENCOUNTER — Other Ambulatory Visit: Payer: Self-pay | Admitting: *Deleted

## 2013-04-21 DIAGNOSIS — I4891 Unspecified atrial fibrillation: Secondary | ICD-10-CM

## 2013-04-21 MED ORDER — RIVAROXABAN 20 MG PO TABS: 20.0000 mg | ORAL_TABLET | Freq: Every day | ORAL | Status: DC

## 2013-05-07 ENCOUNTER — Encounter: Payer: Self-pay | Admitting: Cardiovascular Disease

## 2013-05-07 ENCOUNTER — Ambulatory Visit (INDEPENDENT_AMBULATORY_CARE_PROVIDER_SITE_OTHER): Payer: Medicare Other | Admitting: Cardiovascular Disease

## 2013-05-07 ENCOUNTER — Encounter (INDEPENDENT_AMBULATORY_CARE_PROVIDER_SITE_OTHER): Payer: Self-pay

## 2013-05-07 VITALS — BP 136/84 | HR 94 | Ht 71.0 in | Wt 268.0 lb

## 2013-05-07 DIAGNOSIS — J449 Chronic obstructive pulmonary disease, unspecified: Secondary | ICD-10-CM

## 2013-05-07 DIAGNOSIS — I4891 Unspecified atrial fibrillation: Secondary | ICD-10-CM

## 2013-05-07 DIAGNOSIS — I10 Essential (primary) hypertension: Secondary | ICD-10-CM

## 2013-05-07 DIAGNOSIS — M2550 Pain in unspecified joint: Secondary | ICD-10-CM

## 2013-05-07 MED ORDER — DICLOFENAC SODIUM 1 % TD GEL: TRANSDERMAL | Status: DC

## 2013-05-07 NOTE — Progress Notes (Signed)
History of Present Illness: [name] is a pleasant 77 year old Caucasian male with past medical history significant for atrial fibrillation, hyperlipidemia, hypertension, BPH, GERD, COPD and arthritis who was seen as a new patient in our office in 2010 for evaluation of an abnormal echo. His echo showed normal left ventricular systolic function with mild LVH and mild diastolic dysfunction, mild LAE, trace MR. He was seen in our office 05/22/12 after EKG in pre-op before knee surgery showed atrial fibrillation with HR of 119 bpm. He was started on Xarelto and Lopressor. Echo 05/27/12 with normal LV size and function. Successful DCCV 06/18/12. Right knee replacement 08/07/12 without complications.   He is here today for follow up. He is back in atrial fibrillation. He has had no episodes of chest pain, palpitations, or dizziness with exercise. He has been dyspneic and this is his baseline with his COPD. Also having diffuse joint aches, worse in knees and ankles. Taken off of Meloxicam in primary care due to use of Xarelto.   Primary Care Physician: Lupe Carney  Past Medical History  Diagnosis Date  . Atrial fibrillation     a. Echo 05/27/12:  Mild LVH, EF 55%, aortic sclerosis, no AS, mild LAE, mild RVE, moderate RV dysfunction, mild RAE, PASP 32;  b. Xarelto started 05/22/12  . Overweight(278.02)   . HTN (hypertension)   . HLD (hyperlipidemia)   . GERD (gastroesophageal reflux disease)   . Osteoarthrosis, unspecified whether generalized or localized, unspecified site   . Asthma     "as a child"  . Right knee pain     awaiting knee replacement  . COPD (chronic obstructive pulmonary disease)   . Enlarged prostate   . Hearing loss in right ear     Past Surgical History  Procedure Laterality Date  . Back surgery      2011  . Cardioversion  06/18/2012    Procedure: CARDIOVERSION;  Surgeon: Wendall Stade, MD;  Location: [facility] ENDOSCOPY;  Service: Cardiovascular;  Laterality: N/A;  .  Total knee arthroplasty Right 08/07/2012    Procedure: TOTAL KNEE ARTHROPLASTY;  Surgeon: Nestor Lewandowsky, MD;  Location: [facility] OR;  Service: Orthopedics;  Laterality: Right;    Current Outpatient Prescriptions  Medication Sig Dispense Refill  . amLODipine (NORVASC) 5 MG tablet Take 5 mg by mouth daily.      . benazepril (LOTENSIN) 10 MG tablet Take 10 mg by mouth daily.      Marland Kitchen doxazosin (CARDURA) 4 MG tablet Take 4 mg by mouth daily.        . famotidine (PEPCID) 20 MG tablet Take 1 tablet (20 mg total) by mouth daily.  90 tablet  3  . finasteride (PROSCAR) 5 MG tablet Take 5 mg by mouth daily.        . furosemide (LASIX) 20 MG tablet Take 20 mg by mouth daily.        Marland Kitchen loratadine (CLARITIN) 10 MG tablet Take 10 mg by mouth daily as needed. For allergies      . metoprolol (LOPRESSOR) 50 MG tablet Take 1 tablet (50 mg total) by mouth 2 (two) times daily.  180 tablet  3  . Nutritional Supplements (JUICE PLUS FIBRE PO) Take 6 tablets by mouth daily. 2 berry tablets, 2 fruit tablets, and 2 veggie tablets      . potassium chloride (MICRO-K) 10 MEQ CR capsule Take 10 mEq by mouth daily.       . Rivaroxaban (XARELTO) 20 MG TABS tablet  Take 1 tablet (20 mg total) by mouth daily.  30 tablet  6   No current facility-administered medications for this visit.    No Known Allergies  History   Social History  . Marital Status: Married    Spouse Name: N/A    Number of Children: 2  . Years of Education: N/A   Occupational History  . Retired-purchasing Production designer, theatre/television/film [employer]    Social History Main Topics  . Smoking status: Former Smoker -- 1.00 packs/day for 20 years    Types: Cigarettes    Quit date: 05/22/1977  . Smokeless tobacco: Not on file     Comment: Quit 1978  . Alcohol Use: No  . Drug Use: No  . Sexual Activity: Not on file   Other Topics Concern  . Not on file   Social History Narrative   Retired. Married. Regularly exercises.     Family History  Problem Relation Age of Onset  . Cancer  Other   . Heart attack Brother 75    Review of Systems:  As stated in the HPI and otherwise negative.   BP 136/84  Pulse 94  Ht 5\' 11"  (1.803 m)  Wt 268 lb (121.564 kg)  BMI 37.39 kg/m2  Physical Examination: General: Well developed, well nourished, NAD HEENT: OP clear, mucus membranes moist SKIN: warm, dry. No rashes. Neuro: No focal deficits Musculoskeletal: Muscle strength 5/5 all ext Psychiatric: Mood and affect normal Neck: No JVD, no carotid bruits, no thyromegaly, no lymphadenopathy. Lungs:Clear bilaterally, no wheezes, rhonci, crackles Cardiovascular: Irregular irregular. No murmurs, gallops or rubs. Abdomen:Soft. Bowel sounds present. Non-tender.  Extremities: No lower extremity edema. Pulses are 2 + in the bilateral DP/PT.  EKG: Atrial fib, rate 93 bpm. Incomplete RBBB.   Assessment and Plan:   1. Atrial fibrillation: He is back in atrial fibrillation. He has only been taking Lopressor 50 mg once daily. Will increase Lopressor to 50 mg po BID. Continue anti-coagulation with Xarelto.  I have discussed his recurrent atrial fibrillation. Since he is having no symptoms, will plan for rate control with anti-coagulation. Will not try to restore sinus rhythm with further cardioversions. He is ok with with this plan.   2. Diffuse joint aches/OA: He has been taken off of meloxicam. Will start Voltaren gel to use as needed. If this does not work, would consider Celebrex.  3. COPD: Stable. Dyspnea likely due to his lung disease.   4. HTN: BP well controlled.

## 2013-05-07 NOTE — Patient Instructions (Addendum)
Your physician recommends that you schedule a follow-up appointment in:  3 months. --August 07, 2013 at 10:15  Your physician has recommended you make the following change in your medication:  Use voltaren gel 3-4 times daily as needed to affected area.  Be sure to take Lopressor twice daily

## 2013-08-07 ENCOUNTER — Ambulatory Visit (INDEPENDENT_AMBULATORY_CARE_PROVIDER_SITE_OTHER): Payer: Medicare Other | Admitting: Cardiovascular Disease

## 2013-08-07 ENCOUNTER — Encounter (INDEPENDENT_AMBULATORY_CARE_PROVIDER_SITE_OTHER): Payer: Self-pay

## 2013-08-07 ENCOUNTER — Encounter: Payer: Self-pay | Admitting: Cardiovascular Disease

## 2013-08-07 VITALS — BP 122/91 | HR 81 | Ht 71.0 in | Wt 301.0 lb

## 2013-08-07 DIAGNOSIS — M1990 Unspecified osteoarthritis, unspecified site: Secondary | ICD-10-CM

## 2013-08-07 DIAGNOSIS — I4891 Unspecified atrial fibrillation: Secondary | ICD-10-CM

## 2013-08-07 DIAGNOSIS — J449 Chronic obstructive pulmonary disease, unspecified: Secondary | ICD-10-CM

## 2013-08-07 DIAGNOSIS — I10 Essential (primary) hypertension: Secondary | ICD-10-CM

## 2013-08-07 NOTE — Patient Instructions (Signed)
Your physician wants you to follow-up in:  6 months. You will receive a reminder letter in the mail two months in advance. If you don't receive a letter, please call our office to schedule the follow-up appointment.   

## 2013-08-07 NOTE — Progress Notes (Signed)
History of Present Illness: [name] is a pleasant 78 year old Caucasian male with past medical history significant for atrial fibrillation, hyperlipidemia, hypertension, BPH, GERD, COPD and arthritis who is here today for follow up. He was seen as a new patient in our office in 2010 for evaluation of an abnormal echo. His echo showed normal left ventricular systolic function with mild LVH and mild diastolic dysfunction, mild LAE, trace MR. He was seen in our office 05/22/12 after EKG in pre-op before knee surgery showed atrial fibrillation with HR of 119 bpm. He was started on Xarelto and Lopressor. Echo 05/27/12 with normal LV size and function. Successful DCCV 06/18/12. Right knee replacement 08/25/58 without complications. He was seen in our office November 2014 and was back in atrial fibrillation.   He is here today for follow up. He has had no episodes of chest pain, palpitations, or dizziness with exercise. He has been dyspneic and this is his baseline with his COPD. Also having diffuse joint aches, worse in knees and ankles. Taken off of Meloxicam in primary care due to use of Xarelto.   Primary Care Physician: Donnie Coffin  Past Medical History  Diagnosis Date  . Atrial fibrillation     a. Echo 05/27/12:  Mild LVH, EF 55%, aortic sclerosis, no AS, mild LAE, mild RVE, moderate RV dysfunction, mild RAE, PASP 32;  b. Xarelto started 05/22/12  . Overweight   . HTN (hypertension)   . HLD (hyperlipidemia)   . GERD (gastroesophageal reflux disease)   . Osteoarthrosis, unspecified whether generalized or localized, unspecified site   . Asthma     "as a child"  . Right knee pain     awaiting knee replacement  . COPD (chronic obstructive pulmonary disease)   . Enlarged prostate   . Hearing loss in right ear     Past Surgical History  Procedure Laterality Date  . Back surgery      2011  . Cardioversion  06/18/2012    Procedure: CARDIOVERSION;  Surgeon: Josue Hector, MD;  Location:  [facility];  Service: Cardiovascular;  Laterality: N/A;  . Total knee arthroplasty Right 08/07/2012    Procedure: TOTAL KNEE ARTHROPLASTY;  Surgeon: Kerin Salen, MD;  Location: [facility];  Service: Orthopedics;  Laterality: Right;    Current Outpatient Prescriptions  Medication Sig Dispense Refill  . amLODipine (NORVASC) 5 MG tablet Take 5 mg by mouth daily.      Marland Kitchen amoxicillin (AMOXIL) 500 MG capsule       . benazepril (LOTENSIN) 10 MG tablet Take 10 mg by mouth daily.      . diclofenac sodium (VOLTAREN) 1 % GEL Apply 3-4 times daily as needed to affected area  1 Tube  3  . doxazosin (CARDURA) 4 MG tablet Take 4 mg by mouth daily.        . famotidine (PEPCID) 20 MG tablet Take 1 tablet (20 mg total) by mouth daily.  90 tablet  3  . finasteride (PROSCAR) 5 MG tablet Take 5 mg by mouth daily.        . furosemide (LASIX) 20 MG tablet Take 20 mg by mouth daily.        Marland Kitchen loratadine (CLARITIN) 10 MG tablet Take 10 mg by mouth daily as needed. For allergies      . metoprolol (LOPRESSOR) 50 MG tablet Take 1 tablet (50 mg total) by mouth 2 (two) times daily.  180 tablet  3  . Nutritional Supplements (JUICE PLUS FIBRE  PO) Take 6 tablets by mouth daily. 2 berry tablets, 2 fruit tablets, and 2 veggie tablets      . potassium chloride (MICRO-K) 10 MEQ CR capsule Take 10 mEq by mouth daily.       . Rivaroxaban (XARELTO) 20 MG TABS tablet Take 1 tablet (20 mg total) by mouth daily.  30 tablet  6  . WELCHOL 625 MG tablet Take 625 mg by mouth daily.       No current facility-administered medications for this visit.    No Known Allergies  History   Social History  . Marital Status: Married    Spouse Name: N/A    Number of Children: 2  . Years of Education: N/A   Occupational History  . Retired-purchasing Freight forwarder [employer]    Social History Main Topics  . Smoking status: Former Smoker -- 1.00 packs/day for 20 years    Types: Cigarettes    Quit date: 05/22/1977  . Smokeless tobacco: Not on file      Comment: Quit 1978  . Alcohol Use: No  . Drug Use: No  . Sexual Activity: Not on file   Other Topics Concern  . Not on file   Social History Narrative   Retired. Married. Regularly exercises.     Family History  Problem Relation Age of Onset  . Cancer Other   . Heart attack Brother 75    Review of Systems:  As stated in the HPI and otherwise negative.   BP 122/91  Pulse 81  Ht 5\' 11"  (1.803 m)  Wt 301 lb (136.533 kg)  BMI 42.00 kg/m2  Physical Examination: General: Well developed, well nourished, NAD HEENT: OP clear, mucus membranes moist SKIN: warm, dry. No rashes. Neuro: No focal deficits Musculoskeletal: Muscle strength 5/5 all ext Psychiatric: Mood and affect normal Neck: No JVD, no carotid bruits, no thyromegaly, no lymphadenopathy. Lungs:Clear bilaterally, no wheezes, rhonci, crackles Cardiovascular: Irregular irregular. No murmurs, gallops or rubs. Abdomen:Soft. Bowel sounds present. Non-tender.  Extremities: No lower extremity edema. Pulses are 2 + in the bilateral DP/PT.  EKG: Atrial fibrillation, rate 81 bpm.   Assessment and Plan:   1. Atrial fibrillation: Still in atrial fibrillation. Continue Lopressor 50 mg po BID. Continue anti-coagulation with Xarelto. Since he is having no symptoms, will plan for rate control with anti-coagulation. Will not try to restore sinus rhythm with further cardioversions. He is ok with with this plan.   2. Diffuse joint aches/OA: He has been taken off of meloxicam. He is using Voltaren gel as needed. He would like to use Tramadol. This should be ok. He will call his orthopedic surgeon to start this.   3. COPD: Stable. Dyspnea likely due to his lung disease.   4. HTN: BP well controlled. No changes.

## 2013-10-27 ENCOUNTER — Other Ambulatory Visit: Payer: Self-pay

## 2013-10-27 DIAGNOSIS — I4891 Unspecified atrial fibrillation: Secondary | ICD-10-CM

## 2013-10-27 MED ORDER — FAMOTIDINE 20 MG PO TABS: 20.0000 mg | ORAL_TABLET | Freq: Every day | ORAL | Status: DC

## 2013-10-27 MED ORDER — METOPROLOL TARTRATE 50 MG PO TABS: 50.0000 mg | ORAL_TABLET | Freq: Two times a day (BID) | ORAL | Status: DC

## 2014-02-03 ENCOUNTER — Encounter: Payer: Self-pay | Admitting: Cardiovascular Disease

## 2014-02-03 ENCOUNTER — Ambulatory Visit (INDEPENDENT_AMBULATORY_CARE_PROVIDER_SITE_OTHER): Payer: Medicare Other | Admitting: Cardiovascular Disease

## 2014-02-03 VITALS — BP 141/80 | HR 64 | Ht 71.0 in | Wt 294.8 lb

## 2014-02-03 DIAGNOSIS — I10 Essential (primary) hypertension: Secondary | ICD-10-CM

## 2014-02-03 DIAGNOSIS — I4891 Unspecified atrial fibrillation: Secondary | ICD-10-CM

## 2014-02-03 DIAGNOSIS — M1990 Unspecified osteoarthritis, unspecified site: Secondary | ICD-10-CM

## 2014-02-03 DIAGNOSIS — I482 Chronic atrial fibrillation, unspecified: Secondary | ICD-10-CM

## 2014-02-03 NOTE — Patient Instructions (Signed)
Your physician wants you to follow-up in:  12 months.  You will receive a reminder letter in the mail two months in advance. If you don't receive a letter, please call our office to schedule the follow-up appointment.   

## 2014-02-03 NOTE — Progress Notes (Signed)
History of Present Illness: [name] is a pleasant 78 yo Caucasian male with past medical history significant for atrial fibrillation, hyperlipidemia, hypertension, BPH, GERD, COPD and arthritis who is here today for follow up. He was seen as a new patient in our office in 2010 for evaluation of an abnormal echo. His echo showed normal left ventricular systolic function with mild LVH and mild diastolic dysfunction, mild LAE, trace MR. He was seen in our office 05/22/12 after EKG in pre-op before knee surgery showed atrial fibrillation with HR of 119 bpm. He was started on Xarelto and Lopressor. Echo 05/27/12 with normal LV size and function. Successful DCCV 06/18/12. Right knee replacement 08/06/92 without complications. He was seen in our office November 2014 and was back in atrial fibrillation. Since he was asymptomatic, we planned rate control without another DCCV.   He is here today for follow up. He has had no episodes of chest pain, palpitations, or dizziness with exercise. He has been dyspneic and this is his baseline with his COPD. He has been doing water aerobics three days per week.   Primary Care Physician: Donnie Coffin  Past Medical History  Diagnosis Date  . Atrial fibrillation     a. Echo 05/27/12:  Mild LVH, EF 55%, aortic sclerosis, no AS, mild LAE, mild RVE, moderate RV dysfunction, mild RAE, PASP 32;  b. Xarelto started 05/22/12  . Overweight(278.02)   . HTN (hypertension)   . HLD (hyperlipidemia)   . GERD (gastroesophageal reflux disease)   . Osteoarthrosis, unspecified whether generalized or localized, unspecified site   . Asthma     "as a child"  . Right knee pain     awaiting knee replacement  . COPD (chronic obstructive pulmonary disease)   . Enlarged prostate   . Hearing loss in right ear     Past Surgical History  Procedure Laterality Date  . Back surgery      2011  . Cardioversion  06/18/2012    Procedure: CARDIOVERSION;  Surgeon: Josue Hector, MD;   Location: [facility];  Service: Cardiovascular;  Laterality: N/A;  . Total knee arthroplasty Right 08/07/2012    Procedure: TOTAL KNEE ARTHROPLASTY;  Surgeon: Kerin Salen, MD;  Location: [facility];  Service: Orthopedics;  Laterality: Right;    Current Outpatient Prescriptions  Medication Sig Dispense Refill  . amLODipine (NORVASC) 5 MG tablet Take 5 mg by mouth daily.      . benazepril (LOTENSIN) 10 MG tablet Take 10 mg by mouth daily.      Marland Kitchen doxazosin (CARDURA) 4 MG tablet Take 4 mg by mouth daily.        . famotidine (PEPCID) 20 MG tablet Take 1 tablet (20 mg total) by mouth daily.  90 tablet  3  . finasteride (PROSCAR) 5 MG tablet Take 5 mg by mouth daily.        . furosemide (LASIX) 40 MG tablet Take 40 mg by mouth daily.      Marland Kitchen loratadine (CLARITIN) 10 MG tablet Take 10 mg by mouth daily as needed. For allergies      . metoprolol (LOPRESSOR) 50 MG tablet Take 1 tablet (50 mg total) by mouth 2 (two) times daily.  180 tablet  3  . Nutritional Supplements (JUICE PLUS FIBRE PO) Take 6 tablets by mouth daily. 2 berry tablets, 2 fruit tablets, and 2 veggie tablets      . potassium chloride (MICRO-K) 10 MEQ CR capsule Take 10 mEq by mouth  daily.       . Rivaroxaban (XARELTO) 20 MG TABS tablet Take 1 tablet (20 mg total) by mouth daily.  30 tablet  6  . WELCHOL 625 MG tablet Take 625 mg by mouth daily.       No current facility-administered medications for this visit.    No Known Allergies  History   Social History  . Marital Status: Married    Spouse Name: N/A    Number of Children: 2  . Years of Education: N/A   Occupational History  . Retired-purchasing Freight forwarder [employer]    Social History Main Topics  . Smoking status: Former Smoker -- 1.00 packs/day for 20 years    Types: Cigarettes    Quit date: 05/22/1977  . Smokeless tobacco: Not on file     Comment: Quit 1978  . Alcohol Use: No  . Drug Use: No  . Sexual Activity: Not on file   Other Topics Concern  . Not on file    Social History Narrative   Retired. Married. Regularly exercises.     Family History  Problem Relation Age of Onset  . Cancer Other   . Heart attack Brother 75    Review of Systems:  As stated in the HPI and otherwise negative.   BP 141/80  Pulse 64  Ht 5\' 11"  (1.803 m)  Wt 294 lb 12.8 oz (133.72 kg)  BMI 41.13 kg/m2  Physical Examination: General: Well developed, well nourished, NAD HEENT: OP clear, mucus membranes moist SKIN: warm, dry. No rashes. Neuro: No focal deficits Musculoskeletal: Muscle strength 5/5 all ext Psychiatric: Mood and affect normal Neck: No JVD, no carotid bruits, no thyromegaly, no lymphadenopathy. Lungs:Clear bilaterally, no wheezes, rhonci, crackles Cardiovascular: Irregular irregular. No murmurs, gallops or rubs. Abdomen:Soft. Bowel sounds present. Non-tender.  Extremities: No lower extremity edema. Pulses are 2 + in the bilateral DP/PT  Assessment and Plan:   1. Atrial fibrillation: Still in atrial fibrillation. Continue Lopressor 50 mg po BID. Continue anti-coagulation with Xarelto. Since he is having no symptoms, will plan for rate control with anti-coagulation. Will not try to restore sinus rhythm with further cardioversions. He is ok with with this plan.   2. Diffuse joint aches/OA: He has been taken off of meloxicam. Now on Tramadol.   3. COPD: Stable. Dyspnea likely due to his lung disease.   4. HTN: BP well controlled. No changes.

## 2014-04-16 ENCOUNTER — Encounter: Payer: Self-pay | Admitting: Cardiovascular Disease

## 2014-05-04 ENCOUNTER — Other Ambulatory Visit: Payer: Self-pay

## 2014-05-04 MED ORDER — RIVAROXABAN 20 MG PO TABS: 20.0000 mg | ORAL_TABLET | Freq: Every day | ORAL | Status: DC

## 2014-05-06 ENCOUNTER — Telehealth: Payer: Self-pay

## 2014-05-06 ENCOUNTER — Other Ambulatory Visit: Payer: Self-pay

## 2014-05-06 MED ORDER — METOPROLOL TARTRATE 50 MG PO TABS: 50.0000 mg | ORAL_TABLET | Freq: Two times a day (BID) | ORAL | Status: DC

## 2014-05-06 NOTE — Telephone Encounter (Signed)
lmtco

## 2014-07-13 ENCOUNTER — Other Ambulatory Visit: Payer: Self-pay | Admitting: *Deleted

## 2014-07-13 MED ORDER — RIVAROXABAN 20 MG PO TABS: 20.0000 mg | ORAL_TABLET | Freq: Every day | ORAL | Status: DC

## 2014-07-28 ENCOUNTER — Telehealth: Payer: Self-pay | Admitting: *Deleted

## 2014-07-28 NOTE — Telephone Encounter (Signed)
Spoke with [facility] and prior auth for Xarelto completed. Authorization received through 07/28/15

## 2014-08-26 ENCOUNTER — Other Ambulatory Visit: Payer: Self-pay | Admitting: *Deleted

## 2014-08-26 MED ORDER — METOPROLOL TARTRATE 50 MG PO TABS: 50.0000 mg | ORAL_TABLET | Freq: Two times a day (BID) | ORAL | Status: DC

## 2014-08-26 MED ORDER — FAMOTIDINE 20 MG PO TABS: 20.0000 mg | ORAL_TABLET | Freq: Every day | ORAL | Status: DC

## 2014-12-25 ENCOUNTER — Other Ambulatory Visit: Payer: Self-pay

## 2014-12-25 MED ORDER — FAMOTIDINE 20 MG PO TABS: 20.0000 mg | ORAL_TABLET | Freq: Every day | ORAL | Status: DC

## 2015-02-01 ENCOUNTER — Other Ambulatory Visit: Payer: Self-pay

## 2015-02-01 MED ORDER — METOPROLOL TARTRATE 50 MG PO TABS: 50.0000 mg | ORAL_TABLET | Freq: Two times a day (BID) | ORAL | Status: DC

## 2015-02-19 ENCOUNTER — Other Ambulatory Visit: Payer: Self-pay

## 2015-02-19 MED ORDER — RIVAROXABAN 20 MG PO TABS: 20.0000 mg | ORAL_TABLET | Freq: Every day | ORAL | Status: DC

## 2015-02-19 NOTE — Telephone Encounter (Signed)
OK to refill Xarelto

## 2015-03-10 ENCOUNTER — Encounter: Payer: Self-pay | Admitting: Cardiovascular Disease

## 2015-03-10 ENCOUNTER — Ambulatory Visit (INDEPENDENT_AMBULATORY_CARE_PROVIDER_SITE_OTHER): Payer: Medicare Other | Admitting: Cardiovascular Disease

## 2015-03-10 VITALS — BP 110/62 | HR 76 | Ht 71.0 in | Wt 296.2 lb

## 2015-03-10 DIAGNOSIS — I481 Persistent atrial fibrillation: Secondary | ICD-10-CM

## 2015-03-10 DIAGNOSIS — I10 Essential (primary) hypertension: Secondary | ICD-10-CM

## 2015-03-10 DIAGNOSIS — I4819 Other persistent atrial fibrillation: Secondary | ICD-10-CM

## 2015-03-10 LAB — CBC WITH DIFFERENTIAL/PLATELET
BASOS ABS: 0 10*3/uL (ref 0.0–0.1)
Basophils Relative: 0.5 % (ref 0.0–3.0)
EOS ABS: 0.2 10*3/uL (ref 0.0–0.7)
EOS PCT: 3 % (ref 0.0–5.0)
HCT: 37.2 % — ABNORMAL LOW (ref 39.0–52.0)
Hemoglobin: 12.4 g/dL — ABNORMAL LOW (ref 13.0–17.0)
LYMPHS ABS: 1.5 10*3/uL (ref 0.7–4.0)
Lymphocytes Relative: 19.3 % (ref 12.0–46.0)
MCHC: 33.4 g/dL (ref 30.0–36.0)
MCV: 92.8 fl (ref 78.0–100.0)
MONO ABS: 1 10*3/uL (ref 0.1–1.0)
Monocytes Relative: 13.4 % — ABNORMAL HIGH (ref 3.0–12.0)
NEUTROS PCT: 63.8 % (ref 43.0–77.0)
Neutro Abs: 4.9 10*3/uL (ref 1.4–7.7)
Platelets: 164 10*3/uL (ref 150.0–400.0)
RBC: 4 Mil/uL — AB (ref 4.22–5.81)
RDW: 15.8 % — ABNORMAL HIGH (ref 11.5–15.5)
WBC: 7.8 10*3/uL (ref 4.0–10.5)

## 2015-03-10 NOTE — Progress Notes (Signed)
Chief Complaint  Patient presents with  . Back Pain    History of Present Illness: 79 yo Caucasian male with past medical history significant for persistent atrial fibrillation, hyperlipidemia, hypertension, BPH, GERD, COPD and arthritis who is here today for follow up. He was seen as a new patient in our office in 2010 for evaluation of an abnormal echo. His echo showed normal left ventricular systolic function with mild LVH and mild diastolic dysfunction, mild LAE, trace MR. He was seen in our office 05/22/12 after EKG in pre-op before knee surgery showed atrial fibrillation with HR of 119 bpm. He was started on Xarelto and Lopressor. Echo 05/27/12 with normal LV size and function. Successful DCCV 06/18/12. Right knee replacement 10/26/52 without complications. He was seen in our office November 2014 and was back in atrial fibrillation. Since he was asymptomatic, we planned rate control without another DCCV.   He is here today for follow up. He has had no episodes of chest pain, palpitations, or dizziness with exercise. He has been dyspneic and this is his baseline with his COPD. He has been doing water aerobics three days per week.   Primary Care Physician: Donnie Coffin  Past Medical History  Diagnosis Date  . Atrial fibrillation     a. Echo 05/27/12:  Mild LVH, EF 55%, aortic sclerosis, no AS, mild LAE, mild RVE, moderate RV dysfunction, mild RAE, PASP 32;  b. Xarelto started 05/22/12  . Overweight(278.02)   . HTN (hypertension)   . HLD (hyperlipidemia)   . GERD (gastroesophageal reflux disease)   . Osteoarthrosis, unspecified whether generalized or localized, unspecified site   . Asthma     "as a child"  . Right knee pain     awaiting knee replacement  . COPD (chronic obstructive pulmonary disease)   . Enlarged prostate   . Hearing loss in right ear     Past Surgical History  Procedure Laterality Date  . Back surgery      2011  . Cardioversion  06/18/2012    Procedure:  CARDIOVERSION;  Surgeon: Josue Hector, MD;  Location: [facility];  Service: Cardiovascular;  Laterality: N/A;  . Total knee arthroplasty Right 08/07/2012    Procedure: TOTAL KNEE ARTHROPLASTY;  Surgeon: Kerin Salen, MD;  Location: [facility];  Service: Orthopedics;  Laterality: Right;    Current Outpatient Prescriptions  Medication Sig Dispense Refill  . amLODipine (NORVASC) 5 MG tablet Take 5 mg by mouth daily.    . benazepril (LOTENSIN) 10 MG tablet Take 10 mg by mouth daily.    Marland Kitchen doxazosin (CARDURA) 4 MG tablet Take 4 mg by mouth daily.      . famotidine (PEPCID) 20 MG tablet Take 1 tablet (20 mg total) by mouth daily. 30 tablet 6  . finasteride (PROSCAR) 5 MG tablet Take 5 mg by mouth daily.      . furosemide (LASIX) 40 MG tablet Take 40 mg by mouth daily.    Marland Kitchen gabapentin (NEURONTIN) 300 MG capsule Take 300 mg by mouth 3 (three) times daily.    Marland Kitchen HYDROcodone-acetaminophen (NORCO/VICODIN) 5-325 MG per tablet Take 1 tablet by mouth every 6 (six) hours as needed. For pain    . loratadine (CLARITIN) 10 MG tablet Take 10 mg by mouth daily as needed. For allergies    . metoprolol (LOPRESSOR) 50 MG tablet Take 1 tablet (50 mg total) by mouth 2 (two) times daily. 60 tablet 1  . Nutritional Supplements (JUICE PLUS FIBRE PO) Take  6 tablets by mouth daily. 2 berry tablets, 2 fruit tablets, and 2 veggie tablets    . potassium chloride (MICRO-K) 10 MEQ CR capsule Take 10 mEq by mouth daily.     . rivaroxaban (XARELTO) 20 MG TABS tablet Take 1 tablet (20 mg total) by mouth daily. 30 tablet 6  . WELCHOL 625 MG tablet Take 625 mg by mouth daily.     No current facility-administered medications for this visit.    No Known Allergies  Social History   Social History  . Marital Status: Married    Spouse Name: N/A  . Number of Children: 2  . Years of Education: N/A   Occupational History  . Retired-purchasing Freight forwarder [employer]    Social History Main Topics  . Smoking status: Former Smoker -- 1.00  packs/day for 20 years    Types: Cigarettes    Quit date: 05/22/1977  . Smokeless tobacco: Not on file     Comment: Quit 1978  . Alcohol Use: No  . Drug Use: No  . Sexual Activity: Not on file   Other Topics Concern  . Not on file   Social History Narrative   Retired. Married. Regularly exercises.     Family History  Problem Relation Age of Onset  . Cancer Other   . Heart attack Brother 75    Review of Systems:  As stated in the HPI and otherwise negative.   BP 110/62 mmHg  Pulse 76  Ht 5\' 11"  (1.803 m)  Wt 296 lb 3.2 oz (134.355 kg)  BMI 41.33 kg/m2  SpO2 96%  Physical Examination: General: Well developed, well nourished, NAD HEENT: OP clear, mucus membranes moist SKIN: warm, dry. No rashes. Neuro: No focal deficits Musculoskeletal: Muscle strength 5/5 all ext Psychiatric: Mood and affect normal Neck: No JVD, no carotid bruits, no thyromegaly, no lymphadenopathy. Lungs:Clear bilaterally, no wheezes, rhonci, crackles Cardiovascular: Irregular irregular. No murmurs, gallops or rubs. Abdomen:Soft. Bowel sounds present. Non-tender.  Extremities: No lower extremity edema. Pulses are 2 + in the bilateral DP/PT  EKG:  EKG is ordered today. The ekg ordered today demonstrates Atrial fibrillation, rate 76 bpm.   Recent Labs: No results found for requested labs within last 365 days.   Lipid Panel No results found for: CHOL, TRIG, HDL, CHOLHDL, VLDL, LDLCALC, LDLDIRECT   Wt Readings from Last 3 Encounters:  03/10/15 296 lb 3.2 oz (134.355 kg)  02/03/14 294 lb 12.8 oz (133.72 kg)  08/07/13 301 lb (136.533 kg)     Other studies Reviewed: Additional studies/ records that were reviewed today include: . Review of the above records demonstrates:    Assessment and Plan:   1. Atrial fibrillation: Still in atrial fibrillation. Rate controlled. Continue Lopressor 50 mg po BID. Continue anti-coagulation with Xarelto. Since he is having no symptoms, will plan for rate  control with anti-coagulation. Will not try to restore sinus rhythm with further cardioversions. He is ok with with this plan. Will check CBC today since he is on Xarelto. He had a BMET 11/15 with normal creatinine.   2. Diffuse joint aches/OA/Back pain: This is his main complaint. Followed in primary care.   3. COPD: Stable. Dyspnea likely due to his lung disease.   4. HTN: BP well controlled. No changes.   Current medicines are reviewed at length with the patient today.  The patient does not have concerns regarding medicines.  The following changes have been made:  no change  Labs/ tests ordered today include:  Orders Placed This Encounter  Procedures  . CBC w/Diff  . EKG 12-Lead    Disposition:   FU with me in 12  months  Signed, Lauree Chandler, MD 03/10/2015 12:39 PM    [facility], [place], [place]  [address] Phone: [phone]; Fax: [phone]

## 2015-03-10 NOTE — Patient Instructions (Addendum)
Medication Instructions:  Your physician recommends that you continue on your current medications as directed. Please refer to the Current Medication list given to you today.   Labwork:  lab work to be done today--CBC  Testing/Procedures: none  Follow-Up: Your physician wants you to follow-up in: 12 months.  You will receive a reminder letter in the mail two months in advance. If you don't receive a letter, please call our office to schedule the follow-up appointment.

## 2015-04-06 ENCOUNTER — Other Ambulatory Visit: Payer: Self-pay | Admitting: Cardiovascular Disease

## 2015-04-06 MED ORDER — METOPROLOL TARTRATE 50 MG PO TABS: 50.0000 mg | ORAL_TABLET | Freq: Two times a day (BID) | ORAL | Status: DC

## 2015-08-03 ENCOUNTER — Other Ambulatory Visit: Payer: Self-pay | Admitting: Cardiovascular Disease

## 2015-08-03 ENCOUNTER — Telehealth: Payer: Self-pay

## 2015-08-03 MED ORDER — FAMOTIDINE 20 MG PO TABS: 20.0000 mg | ORAL_TABLET | Freq: Every day | ORAL | Status: DC

## 2015-08-03 NOTE — Telephone Encounter (Signed)
Prior auth for Xarelto 20mg sent to [facility]. 

## 2015-10-04 ENCOUNTER — Other Ambulatory Visit: Payer: Self-pay

## 2015-10-04 MED ORDER — RIVAROXABAN 20 MG PO TABS: 20.0000 mg | ORAL_TABLET | Freq: Every day | ORAL | Status: DC

## 2015-10-25 ENCOUNTER — Encounter: Payer: Self-pay | Admitting: Cardiovascular Disease

## 2015-11-10 ENCOUNTER — Other Ambulatory Visit: Payer: Self-pay

## 2015-11-10 MED ORDER — METOPROLOL TARTRATE 50 MG PO TABS: 50.0000 mg | ORAL_TABLET | Freq: Two times a day (BID) | ORAL | Status: DC

## 2016-02-02 ENCOUNTER — Other Ambulatory Visit: Payer: Self-pay | Admitting: Anesthesiology

## 2016-02-09 ENCOUNTER — Other Ambulatory Visit (HOSPITAL_COMMUNITY): Payer: Medicare Other

## 2016-02-14 ENCOUNTER — Telehealth: Payer: Self-pay | Admitting: Cardiovascular Disease

## 2016-02-15 ENCOUNTER — Encounter (HOSPITAL_COMMUNITY): Payer: Self-pay

## 2016-02-15 ENCOUNTER — Encounter (HOSPITAL_COMMUNITY)
Admission: RE | Admit: 2016-02-15 | Discharge: 2016-02-15 | Disposition: A | Discharge status: Home / Self Care | Payer: Medicare Other | Source: Ambulatory Visit | Attending: Anesthesiology | Admitting: Anesthesiology

## 2016-02-15 DIAGNOSIS — Z87891 Personal history of nicotine dependence: Secondary | ICD-10-CM | POA: Diagnosis not present

## 2016-02-15 DIAGNOSIS — G8929 Other chronic pain: Secondary | ICD-10-CM | POA: Diagnosis not present

## 2016-02-15 DIAGNOSIS — Z7901 Long term (current) use of anticoagulants: Secondary | ICD-10-CM | POA: Diagnosis not present

## 2016-02-15 DIAGNOSIS — K219 Gastro-esophageal reflux disease without esophagitis: Secondary | ICD-10-CM | POA: Diagnosis not present

## 2016-02-15 DIAGNOSIS — M545 Low back pain: Secondary | ICD-10-CM | POA: Diagnosis not present

## 2016-02-15 DIAGNOSIS — N400 Enlarged prostate without lower urinary tract symptoms: Secondary | ICD-10-CM | POA: Diagnosis not present

## 2016-02-15 DIAGNOSIS — E785 Hyperlipidemia, unspecified: Secondary | ICD-10-CM | POA: Diagnosis not present

## 2016-02-15 DIAGNOSIS — M5416 Radiculopathy, lumbar region: Secondary | ICD-10-CM | POA: Diagnosis not present

## 2016-02-15 DIAGNOSIS — I10 Essential (primary) hypertension: Secondary | ICD-10-CM | POA: Diagnosis not present

## 2016-02-15 DIAGNOSIS — Z6841 Body Mass Index (BMI) 40.0 and over, adult: Secondary | ICD-10-CM | POA: Diagnosis not present

## 2016-02-15 DIAGNOSIS — I4891 Unspecified atrial fibrillation: Secondary | ICD-10-CM | POA: Diagnosis not present

## 2016-02-15 DIAGNOSIS — M961 Postlaminectomy syndrome, not elsewhere classified: Secondary | ICD-10-CM | POA: Diagnosis not present

## 2016-02-15 DIAGNOSIS — Z96651 Presence of right artificial knee joint: Secondary | ICD-10-CM | POA: Diagnosis not present

## 2016-02-15 DIAGNOSIS — J449 Chronic obstructive pulmonary disease, unspecified: Secondary | ICD-10-CM | POA: Diagnosis not present

## 2016-02-15 DIAGNOSIS — Z79899 Other long term (current) drug therapy: Secondary | ICD-10-CM | POA: Diagnosis not present

## 2016-02-15 LAB — CBC
HEMATOCRIT: 38.3 % — AB (ref 39.0–52.0)
HEMOGLOBIN: 12.4 g/dL — AB (ref 13.0–17.0)
MCH: 30.4 pg (ref 26.0–34.0)
MCHC: 32.4 g/dL (ref 30.0–36.0)
MCV: 93.9 fL (ref 78.0–100.0)
Platelets: 179 10*3/uL (ref 150–400)
RBC: 4.08 MIL/uL — ABNORMAL LOW (ref 4.22–5.81)
RDW: 15.1 % (ref 11.5–15.5)
WBC: 7.9 10*3/uL (ref 4.0–10.5)

## 2016-02-15 LAB — BASIC METABOLIC PANEL
Anion gap: 7 (ref 5–15)
BUN: 24 mg/dL — ABNORMAL HIGH (ref 6–20)
CALCIUM: 8.7 mg/dL — AB (ref 8.9–10.3)
CHLORIDE: 109 mmol/L (ref 101–111)
CO2: 22 mmol/L (ref 22–32)
CREATININE: 1.23 mg/dL (ref 0.61–1.24)
GFR, EST NON AFRICAN AMERICAN: 54 mL/min — AB (ref >60)
Glucose, Bld: 124 mg/dL — ABNORMAL HIGH (ref 65–99)
Potassium: 4 mmol/L (ref 3.5–5.1)
SODIUM: 138 mmol/L (ref 135–145)

## 2016-02-15 LAB — PROTIME-INR
INR: 1.72
Prothrombin Time: 20.4 seconds — ABNORMAL HIGH (ref 11.4–15.2)

## 2016-02-15 LAB — SURGICAL PCR SCREEN
MRSA, PCR: NEGATIVE
STAPHYLOCOCCUS AUREUS: NEGATIVE

## 2016-02-15 NOTE — Pre-Procedure Instructions (Signed)
John Stark  02/15/2016      John Stark [place] [address] Phone: [phone] Fax: Phil Campbell, [facility] [address] [place] [address] [place] [address] Phone: [phone] Fax: [phone]    Your procedure is scheduled on Aug 25  Report to [place] at 945  A.M.  Call this number if you have problems the morning of surgery:  [phone]   Remember:  Do not eat food or drink liquids after midnight.  Take these medicines the morning of surgery with A SIP OF WATER amlodipine (norvasc), doxazosin (cardura), famotidine (Pepcid), Finasteride (Proscar),Hydrocodone (Norco) if needed, Loratadine (Claritin) if needed, Metoprolol (Lopressor)  Stop taking aspirin, BC's, Goody's, Herbal mediations, Fish Oil, Ibuprofen, Advil, Motrin, Aleve, Vitamins   Do not wear jewelry, make-up or nail polish.  Do not wear lotions, powders, or perfumes.  You may wear deoderant.  Do not shave 48 hours prior to surgery.  Men may shave face and neck.  Do not bring valuables to the hospital.  [facility] is not responsible for any belongings or valuables.  Contacts, dentures or bridgework may not be worn into surgery.  Leave your suitcase in the car.  After surgery it may be brought to your room.  For patients admitted to the hospital, discharge time will be determined by your treatment team.  Patients discharged the day of surgery will not be allowed to drive home.   Special instructions:  [facility] - Preparing for Surgery  Before surgery, you can play an important role.  Because skin is not sterile, your skin needs to be as free of germs as possible.  You can reduce the number of germs on you skin by washing with CHG (chlorahexidine gluconate) soap before surgery.  CHG is an antiseptic cleaner which kills germs and bonds with the skin to  continue killing germs even after washing.  Please DO NOT use if you have an allergy to CHG or antibacterial soaps.  If your skin becomes reddened/irritated stop using the CHG and inform your nurse when you arrive at Short Stay.  Do not shave (including legs and underarms) for at least 48 hours prior to the first CHG shower.  You may shave your face.  Please follow these instructions carefully:   1.  Shower with CHG Soap the night before surgery and the   morning of Surgery.  2.  If you choose to wash your hair, wash your hair first as usual with your  normal shampoo.  3.  After you shampoo, rinse your hair and body thoroughly to remove the  Shampoo.  4.  Use CHG as you would any other liquid soap.  You can apply chg directly  to the skin and wash gently with scrungie or a clean washcloth.  5.  Apply the CHG Soap to your body ONLY FROM THE NECK DOWN.    Do not use on open wounds or open sores.  Avoid contact with your eyes,   ears, mouth and genitals (private parts).  Wash genitals (private parts)  with your normal soap.  6.  Wash thoroughly, paying special attention to the area where your surgery  will be performed.  7.  Thoroughly rinse your body with warm water from the neck down.  8.  DO NOT shower/wash with your normal soap after using and rinsing off  the CHG  Soap.  9.  Pat yourself dry with a clean towel.            10.  Wear clean pajamas.            11.  Place clean sheets on your bed the night of your first shower and do not sleep with pets.  Day of Surgery  Do not apply any lotions/deoderants the morning of surgery.  Please wear clean clothes to the hospital/surgery center.     Please read over the following fact sheets that you were given. Pain Booklet, Coughing and Deep Breathing and Surgical Site Infection Prevention

## 2016-02-15 NOTE — Progress Notes (Signed)
PCP is Dr. Carlean Jews.. John Stark Cardiologist is Dr. Angelena Form- saw last 03-10-15 States he was told to stop taking Xarelto (last dose 02-14-16)

## 2016-02-15 NOTE — Progress Notes (Signed)
   02/15/16 1315  OBSTRUCTIVE SLEEP APNEA  Have you ever been diagnosed with sleep apnea through a sleep study? No  Do you snore loudly (loud enough to be heard through closed doors)?  0  Do you often feel tired, fatigued, or sleepy during the daytime (such as falling asleep during driving or talking to someone)? 0  Has anyone observed you stop breathing during your sleep? 0  Do you have, or are you being treated for high blood pressure? 1  BMI more than 35 kg/m2? 1  Age > 50 (1-yes) 1  Neck circumference greater than:Male 16 inches or larger, Male 17inches or larger? 1  Male Gender (Yes=1) 1  Obstructive Sleep Apnea Score 5  Score 5 or greater  Results sent to PCP

## 2016-02-16 NOTE — Progress Notes (Signed)
Anesthesia Chart Review: Patient is a 80 year old male scheduled for lumbar spinal cord stimulator insertion on 02/18/16 by Dr. Maryjean Ka.  History includes afib (diagnosed 04/2012 by preop EKG) with DCCV 06/18/12 but with recurrence 04/2013 with recommendation for rate control), HTN, HLD, former smoker, BPH, GERD, COPD, exertional dyspnea, back surgery, morbid obesity (BMI 41.67), right TKA 08/07/12.  OSA screening score is 5.   PCP is Dr. Donnie Coffin. Cardiologist is Dr. Lauree Chandler, last visit 03/10/15. One year follow-up recommended.   Meds include amlodipine, benazepril, doxazosin, Pepcid, finasteride, Lasix, Norco, Lopressor, KCl, Xarelto (last dose 02/14/16), Welchol.  BP (!) 145/86   Pulse 91   Temp 36.6 C   Resp 20   Ht 5\' 11"  (1.803 m)   Wt 298 lb 12.8 oz (135.5 kg)   SpO2 97%   BMI 41.67 kg/m   02/15/16 EKG: Afib at 90 bpm. Known history of afib. Comparison tracing on 03/10/15 showed afib at 76 bpm, low voltage.  05/27/12 Echo: Study Conclusions - Left ventricle: Optison contrast was used. The cavity size was at the upper limits of normal. Wall thickness was increased in a pattern of mild LVH. The estimated ejection fraction was 55%. Regional wall motion abnormalities cannot be excluded. - Aortic valve: Sclerosis without stenosis. - Left atrium: The atrium was mildly dilated. - Right ventricle: The cavity size was mildly dilated. Systolic function was moderately reduced. - Right atrium: The atrium was mildly dilated. - Pulmonary arteries: PA peak pressure: 83mm Hg (S).  Preoperative labs noted. Cr 1.23. H/H 12.4/38.3. PT 20.4, INR 1.72, glucose 124. Will check PT/PTT on arrival day of surgery.   If afib remains rate controlled, coags acceptable and otherwise no acute changes then I anticipate that he can proceed as planned.  George Hugh [facility] Short Stay Center/Anesthesiology Phone [phone] 02/16/2016 9:26 AM

## 2016-02-17 MED ORDER — DEXTROSE 5 % IV SOLN
3.0000 g | INTRAVENOUS | Status: DC
  Filled 2016-02-17: qty 3000

## 2016-02-17 MED ORDER — DEXTROSE 5 % IV SOLN
3.0000 g | INTRAVENOUS | Status: AC
  Administered 2016-02-18: 3 g via INTRAVENOUS
  Filled 2016-02-17: qty 3000

## 2016-02-17 NOTE — H&P (Signed)
John Stark is an 80 y.o. male.   Chief Complaint: Back pain HPI: Patient with history of spinal stenosis, at multiple levels,And underwent L1 laminotomy, L2-L3 L4-L5 laminectomy in 2011 for decompression.  He still continues to have fairly notable back pain, some symptoms of neurogenic claudication from time to time.  He tried various medications, with limitations of either poor efficacy or side effects.  Interventional approaches have been tried previously, with limited or no success.  He went to an SCS presentation, became interested in the therapy, and underwent successful trial ultimately, with greater than 50% reduction in his back pain.  He still has knee pain, related to degenerative joint disease, but was able to increase his activity somewhat.    Past Medical History:  Diagnosis Date  . Asthma    "as a child"  . Atrial fibrillation (HCC)    a. Echo 05/27/12:  Mild LVH, EF 55%, aortic sclerosis, no AS, mild LAE, mild RVE, moderate RV dysfunction, mild RAE, PASP 32;  b. Xarelto started 05/22/12  . COPD (chronic obstructive pulmonary disease) (HCC)   . Dysrhythmia    a fib  . Enlarged prostate   . GERD (gastroesophageal reflux disease)   . Hearing loss in right ear   . HLD (hyperlipidemia)   . HTN (hypertension)   . Osteoarthrosis, unspecified whether generalized or localized, unspecified site   . Overweight(278.02)   . Right knee pain    awaiting knee replacement  . Shortness of breath dyspnea     Past Surgical History:  Procedure Laterality Date  . BACK SURGERY     2011  . CARDIOVERSION  06/18/2012   Procedure: CARDIOVERSION;  Surgeon: Wendall Stade, MD;  Location[facility];  Service: Cardiovascular;  Laterality: N/A;  . COLONOSCOPY    . TOTAL KNEE ARTHROPLASTY Right 08/07/2012   Procedure: TOTAL KNEE ARTHROPLASTY;  Surgeon: Nestor Lewandowsky, MD;  Location: [facility] OR;  Service: Orthopedics;  Laterality: Right;    Family History  Problem Relation Age of Onset  .  Cancer Other   . Heart attack Brother 33   Social History:  reports that he quit smoking about 38 years ago. His smoking use included Cigarettes. He has a 20.00 pack-year smoking history. He has never used smokeless tobacco. He reports that he does not drink alcohol or use drugs.  Allergies: No Known Allergies  Medications Prior to Admission  Medication Sig Dispense Refill  . amLODipine (NORVASC) 5 MG tablet Take 5 mg by mouth daily.    . benazepril (LOTENSIN) 10 MG tablet Take 10 mg by mouth daily.    Marland Kitchen doxazosin (CARDURA) 4 MG tablet Take 4 mg by mouth daily.      . famotidine (PEPCID) 20 MG tablet Take 1 tablet (20 mg total) by mouth daily. 30 tablet 6  . finasteride (PROSCAR) 5 MG tablet Take 5 mg by mouth daily.      . furosemide (LASIX) 40 MG tablet Take 40 mg by mouth daily.    Marland Kitchen HYDROcodone-acetaminophen (NORCO/VICODIN) 5-325 MG per tablet Take 1 tablet by mouth 2 (two) times daily. For pain     . loratadine (CLARITIN) 10 MG tablet Take 10 mg by mouth daily as needed. For allergies    . metoprolol (LOPRESSOR) 50 MG tablet Take 1 tablet (50 mg total) by mouth 2 (two) times daily. 60 tablet 6  . Nutritional Supplements (JUICE PLUS FIBRE PO) Take 6 tablets by mouth daily. 2 berry tablets, 2 fruit tablets, and 2  veggie tablets    . potassium chloride (MICRO-K) 10 MEQ CR capsule Take 10 mEq by mouth daily.     . rivaroxaban (XARELTO) 20 MG TABS tablet Take 1 tablet (20 mg total) by mouth daily. 30 tablet 6  . WELCHOL 625 MG tablet Take 625 mg by mouth daily.      Results for orders placed or performed during the hospital encounter of 02/15/16 (from the past 48 hour(s))  CBC     Status: Abnormal   Collection Time: 02/15/16  1:33 PM  Result Value Ref Range   WBC 7.9 4.0 - 10.5 K/uL   RBC 4.08 (L) 4.22 - 5.81 MIL/uL   Hemoglobin 12.4 (L) 13.0 - 17.0 g/dL   HCT 38.3 (L) 39.0 - 52.0 %   MCV 93.9 78.0 - 100.0 fL   MCH 30.4 26.0 - 34.0 pg   MCHC 32.4 30.0 - 36.0 g/dL   RDW 15.1 11.5 -  15.5 %   Platelets 179 150 - 400 K/uL  Protime-INR     Status: Abnormal   Collection Time: 02/15/16  1:33 PM  Result Value Ref Range   Prothrombin Time 20.4 (H) 11.4 - 15.2 seconds   INR 8.93   Basic metabolic panel     Status: Abnormal   Collection Time: 02/15/16  1:33 PM  Result Value Ref Range   Sodium 138 135 - 145 mmol/L   Potassium 4.0 3.5 - 5.1 mmol/L   Chloride 109 101 - 111 mmol/L   CO2 22 22 - 32 mmol/L   Glucose, Bld 124 (H) 65 - 99 mg/dL   BUN 24 (H) 6 - 20 mg/dL   Creatinine, Ser 1.23 0.61 - 1.24 mg/dL   Calcium 8.7 (L) 8.9 - 10.3 mg/dL   GFR calc non Af Amer 54 (L) >60 mL/min   GFR calc Af Amer >60 >60 mL/min    Comment: (NOTE) The eGFR has been calculated using the CKD EPI equation. This calculation has not been validated in all clinical situations. eGFR's persistently <60 mL/min signify possible Chronic Kidney Disease.    Anion gap 7 5 - 15  Surgical pcr screen     Status: None   Collection Time: 02/15/16  1:35 PM  Result Value Ref Range   MRSA, PCR NEGATIVE NEGATIVE   Staphylococcus aureus NEGATIVE NEGATIVE    Comment:        The Xpert SA Assay (FDA approved for NASAL specimens in patients over 15 years of age), is one component of a comprehensive surveillance program.  Test performance has been validated by [facility] for patients greater than or equal to 15 year old. It is not intended to diagnose infection nor to guide or monitor treatment.    No results found.  Review of Systems  Constitutional: Negative.   HENT: Negative.   Eyes: Negative.   Respiratory: Negative.   Cardiovascular: Negative.   Gastrointestinal: Negative.   Genitourinary: Negative.   Musculoskeletal: Positive for back pain and joint pain. Negative for falls, myalgias and neck pain.  Skin: Negative.   Neurological: Negative.   Endo/Heme/Allergies: Negative.   Psychiatric/Behavioral: Negative.     Blood pressure 126/76, pulse 67, temperature 98.7 F (37.1 C),  temperature source Oral, resp. rate 18, height '5\' 11"'$  (1.803 m), weight 135.5 kg (298 lb 12.8 oz), SpO2 95 %. Physical Exam  Constitutional: He is oriented to person, place, and time. He appears well-developed and well-nourished.  HENT:  Head: Normocephalic and atraumatic.  Eyes: EOM are normal.  Pupils are equal, round, and reactive to light.  Neck: Normal range of motion.  Cardiovascular: Normal rate and regular rhythm.   Respiratory: Effort normal.  Musculoskeletal: Normal range of motion.  Neurological: He is alert and oriented to person, place, and time.  Skin: Skin is warm and dry.  Psychiatric: He has a normal mood and affect. His behavior is normal. Judgment and thought content normal.     Assessment/Plan 1) lumbar post-laminectomy syndrome; 2) lumbago; 3) chronic pain PLAN: permanent implant SCS, [employer]  Bonna Gains, MD 02/18/2016, 12:51 PM

## 2016-02-18 ENCOUNTER — Ambulatory Visit (HOSPITAL_COMMUNITY): Payer: Medicare Other

## 2016-02-18 ENCOUNTER — Ambulatory Visit (HOSPITAL_COMMUNITY): Payer: Medicare Other | Admitting: Vascular Surgery

## 2016-02-18 ENCOUNTER — Encounter (HOSPITAL_COMMUNITY)
Admission: RE | Disposition: A | Discharge status: Home / Self Care | Payer: Self-pay | Source: Ambulatory Visit | Attending: Anesthesiology

## 2016-02-18 ENCOUNTER — Ambulatory Visit (HOSPITAL_COMMUNITY)
Admission: RE | Admit: 2016-02-18 | Discharge: 2016-02-18 | Disposition: A | Discharge status: Home / Self Care | Payer: Medicare Other | Source: Ambulatory Visit | Attending: Anesthesiology | Admitting: Anesthesiology

## 2016-02-18 ENCOUNTER — Encounter (HOSPITAL_COMMUNITY): Payer: Self-pay | Admitting: *Deleted

## 2016-02-18 DIAGNOSIS — M5416 Radiculopathy, lumbar region: Secondary | ICD-10-CM | POA: Insufficient documentation

## 2016-02-18 DIAGNOSIS — J449 Chronic obstructive pulmonary disease, unspecified: Secondary | ICD-10-CM | POA: Insufficient documentation

## 2016-02-18 DIAGNOSIS — Z96651 Presence of right artificial knee joint: Secondary | ICD-10-CM | POA: Diagnosis not present

## 2016-02-18 DIAGNOSIS — Z6841 Body Mass Index (BMI) 40.0 and over, adult: Secondary | ICD-10-CM | POA: Insufficient documentation

## 2016-02-18 DIAGNOSIS — G8929 Other chronic pain: Secondary | ICD-10-CM | POA: Insufficient documentation

## 2016-02-18 DIAGNOSIS — N400 Enlarged prostate without lower urinary tract symptoms: Secondary | ICD-10-CM | POA: Diagnosis not present

## 2016-02-18 DIAGNOSIS — Z7901 Long term (current) use of anticoagulants: Secondary | ICD-10-CM | POA: Diagnosis not present

## 2016-02-18 DIAGNOSIS — Z87891 Personal history of nicotine dependence: Secondary | ICD-10-CM | POA: Insufficient documentation

## 2016-02-18 DIAGNOSIS — I4891 Unspecified atrial fibrillation: Secondary | ICD-10-CM | POA: Diagnosis not present

## 2016-02-18 DIAGNOSIS — Z79899 Other long term (current) drug therapy: Secondary | ICD-10-CM | POA: Insufficient documentation

## 2016-02-18 DIAGNOSIS — M545 Low back pain: Secondary | ICD-10-CM | POA: Diagnosis not present

## 2016-02-18 DIAGNOSIS — M961 Postlaminectomy syndrome, not elsewhere classified: Secondary | ICD-10-CM | POA: Diagnosis not present

## 2016-02-18 DIAGNOSIS — I10 Essential (primary) hypertension: Secondary | ICD-10-CM | POA: Insufficient documentation

## 2016-02-18 DIAGNOSIS — K219 Gastro-esophageal reflux disease without esophagitis: Secondary | ICD-10-CM | POA: Diagnosis not present

## 2016-02-18 DIAGNOSIS — Z419 Encounter for procedure for purposes other than remedying health state, unspecified: Secondary | ICD-10-CM

## 2016-02-18 DIAGNOSIS — E785 Hyperlipidemia, unspecified: Secondary | ICD-10-CM | POA: Diagnosis not present

## 2016-02-18 HISTORY — PX: SPINAL CORD STIMULATOR INSERTION: SHX5378

## 2016-02-18 LAB — APTT: APTT: 29 s (ref 24–36)

## 2016-02-18 LAB — PROTIME-INR
INR: 1.17
PROTHROMBIN TIME: 14.9 s (ref 11.4–15.2)

## 2016-02-18 SURGERY — INSERTION, SPINAL CORD STIMULATOR, LUMBAR
Anesthesia: Monitor Anesthesia Care

## 2016-02-18 MED ORDER — ONDANSETRON HCL 4 MG/2ML IJ SOLN
INTRAMUSCULAR | Status: DC | PRN
  Administered 2016-02-18: 4 mg via INTRAVENOUS

## 2016-02-18 MED ORDER — DEXMEDETOMIDINE HCL IN NACL 400 MCG/100ML IV SOLN
INTRAVENOUS | Status: DC | PRN
  Administered 2016-02-18: 0.7 ug/kg/h via INTRAVENOUS

## 2016-02-18 MED ORDER — PROPOFOL 1000 MG/100ML IV EMUL
INTRAVENOUS | Status: AC
  Filled 2016-02-18: qty 100

## 2016-02-18 MED ORDER — HYDROCODONE-ACETAMINOPHEN 5-325 MG PO TABS: 1.0000 | ORAL_TABLET | ORAL | 0 refills | Status: DC | PRN

## 2016-02-18 MED ORDER — PROPOFOL 500 MG/50ML IV EMUL
INTRAVENOUS | Status: DC | PRN
Start: 2016-02-18 — End: 2016-02-18
  Administered 2016-02-18: 500 ug/kg/min via INTRAVENOUS

## 2016-02-18 MED ORDER — BUPIVACAINE-EPINEPHRINE (PF) 0.5% -1:200000 IJ SOLN
INTRAMUSCULAR | Status: DC | PRN
  Administered 2016-02-18: 35 mL via PERINEURAL

## 2016-02-18 MED ORDER — FENTANYL CITRATE (PF) 100 MCG/2ML IJ SOLN
INTRAMUSCULAR | Status: AC
  Filled 2016-02-18: qty 2

## 2016-02-18 MED ORDER — DEXAMETHASONE SODIUM PHOSPHATE 10 MG/ML IJ SOLN
10.0000 mg | Freq: Once | INTRAMUSCULAR | Status: AC
  Administered 2016-02-18: 10 mg via INTRAVENOUS

## 2016-02-18 MED ORDER — DEXAMETHASONE SODIUM PHOSPHATE 10 MG/ML IJ SOLN
INTRAMUSCULAR | Status: AC
  Filled 2016-02-18: qty 1

## 2016-02-18 MED ORDER — CHLORHEXIDINE GLUCONATE CLOTH 2 % EX PADS: 6.0000 | MEDICATED_PAD | Freq: Once | CUTANEOUS | Status: DC

## 2016-02-18 MED ORDER — 0.9 % SODIUM CHLORIDE (POUR BTL) OPTIME
TOPICAL | Status: DC | PRN
  Administered 2016-02-18: 1000 mL

## 2016-02-18 MED ORDER — CEPHALEXIN 500 MG PO CAPS: 500.0000 mg | ORAL_CAPSULE | Freq: Three times a day (TID) | ORAL | 0 refills | Status: DC

## 2016-02-18 MED ORDER — FENTANYL CITRATE (PF) 100 MCG/2ML IJ SOLN
INTRAMUSCULAR | Status: DC | PRN
  Administered 2016-02-18 (×2): 100 ug via INTRAVENOUS

## 2016-02-18 MED ORDER — FENTANYL CITRATE (PF) 100 MCG/2ML IJ SOLN: 25.0000 ug | INTRAMUSCULAR | Status: DC | PRN

## 2016-02-18 MED ORDER — SODIUM CHLORIDE 0.9 % IR SOLN
Status: DC | PRN
  Administered 2016-02-18: 14:00:00

## 2016-02-18 MED ORDER — BACITRACIN-NEOMYCIN-POLYMYXIN OINTMENT TUBE
TOPICAL_OINTMENT | CUTANEOUS | Status: DC | PRN
  Administered 2016-02-18: 1 via TOPICAL

## 2016-02-18 MED ORDER — LACTATED RINGERS IV SOLN
INTRAVENOUS | Status: DC
  Administered 2016-02-18 (×2): via INTRAVENOUS

## 2016-02-18 MED ORDER — PROPOFOL 500 MG/50ML IV EMUL
INTRAVENOUS | Status: DC | PRN
  Administered 2016-02-18: 30 ug via INTRAVENOUS
  Administered 2016-02-18: 50 ug via INTRAVENOUS

## 2016-02-18 MED ORDER — DEXMEDETOMIDINE HCL IN NACL 200 MCG/50ML IV SOLN
INTRAVENOUS | Status: AC
Start: 2016-02-18 — End: 2016-02-18
  Filled 2016-02-18: qty 50

## 2016-02-18 MED ORDER — LIDOCAINE 2% (20 MG/ML) 5 ML SYRINGE
INTRAMUSCULAR | Status: DC | PRN
  Administered 2016-02-18: 100 mg via INTRAVENOUS

## 2016-02-18 MED ORDER — PROPOFOL 10 MG/ML IV BOLUS
INTRAVENOUS | Status: AC
  Filled 2016-02-18: qty 20

## 2016-02-18 SURGICAL SUPPLY — 61 items
ANCHOR CLIK X NEURO (Stimulator) ×2 IMPLANT
BAG DECANTER FOR FLEXI CONT (MISCELLANEOUS) ×2 IMPLANT
BENZOIN TINCTURE PRP APPL 2/3 (GAUZE/BANDAGES/DRESSINGS) IMPLANT
BINDER ABDOMINAL 12 ML 46-62 (SOFTGOODS) ×2 IMPLANT
BLADE CLIPPER SURG (BLADE) IMPLANT
CABLE OR STIMULATOR 2X8 61 (WIRE) ×4 IMPLANT
CHLORAPREP W/TINT 26ML (MISCELLANEOUS) ×2 IMPLANT
CLIP TI WIDE RED SMALL 6 (CLIP) IMPLANT
DRAPE C-ARM 42X72 X-RAY (DRAPES) ×2 IMPLANT
DRAPE C-ARMOR (DRAPES) ×2 IMPLANT
DRAPE LAPAROTOMY 100X72X124 (DRAPES) ×2 IMPLANT
DRAPE POUCH INSTRU U-SHP 10X18 (DRAPES) ×2 IMPLANT
DRAPE SURG 17X23 STRL (DRAPES) ×2 IMPLANT
DRSG OPSITE POSTOP 3X4 (GAUZE/BANDAGES/DRESSINGS) ×2 IMPLANT
DRSG OPSITE POSTOP 4X6 (GAUZE/BANDAGES/DRESSINGS) ×2 IMPLANT
ELECT REM PT RETURN 9FT ADLT (ELECTROSURGICAL) ×2
ELECTRODE REM PT RTRN 9FT ADLT (ELECTROSURGICAL) ×1 IMPLANT
GAUZE SPONGE 4X4 16PLY XRAY LF (GAUZE/BANDAGES/DRESSINGS) ×2 IMPLANT
GLOVE BIOGEL PI IND STRL 7.5 (GLOVE) ×1 IMPLANT
GLOVE BIOGEL PI INDICATOR 7.5 (GLOVE) ×1
GLOVE ECLIPSE 7.5 STRL STRAW (GLOVE) ×2 IMPLANT
GLOVE EXAM NITRILE LRG STRL (GLOVE) IMPLANT
GLOVE EXAM NITRILE XL STR (GLOVE) IMPLANT
GLOVE EXAM NITRILE XS STR PU (GLOVE) IMPLANT
GOWN STRL REUS W/ TWL LRG LVL3 (GOWN DISPOSABLE) IMPLANT
GOWN STRL REUS W/ TWL XL LVL3 (GOWN DISPOSABLE) IMPLANT
GOWN STRL REUS W/TWL 2XL LVL3 (GOWN DISPOSABLE) IMPLANT
GOWN STRL REUS W/TWL LRG LVL3 (GOWN DISPOSABLE)
GOWN STRL REUS W/TWL XL LVL3 (GOWN DISPOSABLE)
IPG PRECISION SPECTRA (Stimulator) ×2 IMPLANT
KIT BASIN OR (CUSTOM PROCEDURE TRAY) ×2 IMPLANT
KIT CHARGING (KITS) ×1
KIT CHARGING PRECISION NEURO (KITS) ×1 IMPLANT
KIT PAT PROGRAM FREELINK (KITS) ×1 IMPLANT
KIT ROOM TURNOVER OR (KITS) ×2 IMPLANT
LEAD INFINION CX PERC 70CM (Cable) ×4 IMPLANT
LIQUID BAND (GAUZE/BANDAGES/DRESSINGS) ×2 IMPLANT
NEEDLE 18GX1X1/2 (RX/OR ONLY) (NEEDLE) IMPLANT
NEEDLE ENTRADA 4.5IN (NEEDLE) ×4 IMPLANT
NEEDLE HYPO 25X1 1.5 SAFETY (NEEDLE) ×2 IMPLANT
NS IRRIG 1000ML POUR BTL (IV SOLUTION) ×2 IMPLANT
PACK LAMINECTOMY NEURO (CUSTOM PROCEDURE TRAY) ×2 IMPLANT
PAD ARMBOARD 7.5X6 YLW CONV (MISCELLANEOUS) ×6 IMPLANT
REMOTE CONTROL KIT (KITS) ×2
SPONGE LAP 4X18 X RAY DECT (DISPOSABLE) ×2 IMPLANT
SPONGE SURGIFOAM ABS GEL SZ50 (HEMOSTASIS) IMPLANT
STAPLER SKIN PROX WIDE 3.9 (STAPLE) ×2 IMPLANT
STRIP CLOSURE SKIN 1/2X4 (GAUZE/BANDAGES/DRESSINGS) IMPLANT
SUT MNCRL AB 4-0 PS2 18 (SUTURE) IMPLANT
SUT SILK 0 (SUTURE) ×1
SUT SILK 0 MO-6 18XCR BRD 8 (SUTURE) ×1 IMPLANT
SUT SILK 0 TIES 10X30 (SUTURE) IMPLANT
SUT SILK 2 0 TIES 10X30 (SUTURE) IMPLANT
SUT VIC AB 2-0 CP2 18 (SUTURE) ×8 IMPLANT
SYR EPIDURAL 5ML GLASS (SYRINGE) ×2 IMPLANT
SYRINGE 10CC LL (SYRINGE) IMPLANT
TOOL LONG TUNNEL (SPINAL CORD STIMULATOR) ×2 IMPLANT
TOWEL OR 17X24 6PK STRL BLUE (TOWEL DISPOSABLE) ×2 IMPLANT
TOWEL OR 17X26 10 PK STRL BLUE (TOWEL DISPOSABLE) ×2 IMPLANT
WATER STERILE IRR 1000ML POUR (IV SOLUTION) ×2 IMPLANT
YANKAUER SUCT BULB TIP NO VENT (SUCTIONS) ×2 IMPLANT

## 2016-02-18 NOTE — Progress Notes (Signed)
Pts heart rate down again with vomiting , right back up to 80,Dr [facility] aware

## 2016-02-18 NOTE — Discharge Instructions (Addendum)
Dr. Maryjean Ka Post-Op Orders   Ice Pack - 20 minutes on (in a pillow case), and 20 minutes off. Wear the ice pack UNDER the binder.  Follow up in office, they will call you for an appointment in 10 days to 2 weeks.  Increase activity gradually.    No lifting anything heavier than a gallon of milk (10 pounds) until seen in the office.  Advance diet slowly as tolerated.  Dressing care:  Keep dressing dry for 3 days, and on Post-op day 4, may shower.  Call for fever, drainage, and redness.  No swimming or bathing in a bathtub (do not get into standing water). RESTART XARELTO Sunday AUGUST 27

## 2016-02-18 NOTE — Anesthesia Preprocedure Evaluation (Addendum)
Anesthesia Evaluation  Patient identified by MRN, date of birth, ID band Patient awake    Reviewed: Allergy & Precautions, H&P , Patient's Chart, lab work & pertinent test results, reviewed documented beta blocker date and time   History of Anesthesia Complications (+) PROLONGED EMERGENCE  Airway Mallampati: IV  TM Distance: >3 FB Neck ROM: full    Dental no notable dental hx.    Pulmonary COPD, former smoker,    Pulmonary exam normal breath sounds clear to auscultation       Cardiovascular hypertension, On Medications  Rhythm:regular Rate:Normal     Neuro/Psych    GI/Hepatic   Endo/Other  Morbid obesity  Renal/GU      Musculoskeletal   Abdominal   Peds  Hematology   Anesthesia Other Findings   Reproductive/Obstetrics                            Anesthesia Physical Anesthesia Plan  ASA: III  Anesthesia Plan: MAC   Post-op Pain Management:    Induction: Intravenous  Airway Management Planned: Mask and Natural Airway  Additional Equipment:   Intra-op Plan:   Post-operative Plan:   Informed Consent: I have reviewed the patients History and Physical, chart, labs and discussed the procedure including the risks, benefits and alternatives for the proposed anesthesia with the patient or authorized representative who has indicated his/her understanding and acceptance.   Dental Advisory Given  Plan Discussed with: CRNA and Surgeon  Anesthesia Plan Comments: (Discussed sedation with Precedex and Propofol +/- ketamine; and potential to need to place airway or ETT if warranted by clinical changes intra-operatively. We will start procedure as MAC.)        Anesthesia Quick Evaluation

## 2016-02-18 NOTE — Transfer of Care (Signed)
Immediate Anesthesia Transfer of Care Note  Patient: John Stark  Procedure(s) Performed: Procedure(s) with comments: LUMBAR SPINAL CORD STIMULATOR INSERTION (N/A) - LUMBAR SPINAL CORD STIMULATOR INSERTION  Patient Location: PACU  Anesthesia Type:MAC  Level of Consciousness: awake, alert , oriented and patient cooperative  Airway & Oxygen Therapy: Patient Spontanous Breathing and Patient connected to nasal cannula oxygen  Post-op Assessment: Report given to RN, Post -op Vital signs reviewed and stable and Patient moving all extremities  Post vital signs: Reviewed and stable  Last Vitals:  Vitals:   02/18/16 1458 02/18/16 1500  BP:  (!) 149/105  Pulse: 80 87  Resp:  (!) 22  Temp: 36.4 C     Last Pain:  Vitals:   02/18/16 0939  TempSrc:   PainSc: 6       Patients Stated Pain Goal: 2 ([id number])  Complications: No apparent anesthesia complications

## 2016-02-18 NOTE — Op Note (Signed)
PREOP DX: 1) lumbago  2) lumbar radiculopathy  3) lumbar post-laminectomy syndrome  4) chronic pain  POSTOP DX: 1) lumbago  2) lumbar radiculopathy  3) lumbar post-laminectomy syndrome  4) chronic pain PROCEDURES PERFORMED:1) intraop fluoro 2) placement of 2 16 contact [employer] Infinion leads 3) placement of Spectra SCS generator  SURGEON:Carlette Palmatier  ASSISTANT: NONE  ANESTHESIA: MAC  EBL: <20cc  DESCRIPTION OF PROCEDURE: After a discussion of risks, benefits and alternatives, informed consent was obtained. The patient was taken to the OR, turned prone onto a Jackson table, all pressure points padded, SCD's placed, and an adequate plane of anesthesia induced. A timeout was taken to verify the correct patient, position, personnel, availability of appropriate equipment, and administration of perioperative antibiotics.  The thoracic and lumbar areas were widely prepped with chloraprep and draped into a sterile field. Fluoroscopy was used to plan a right paramedian incision at the T12-L3 levels, and an incision made with a 10 blade and carried down to the dorsolumbar fascia with the bovie and blunt dissection. Retractors were placed and a 14g [employer] tuohy needle placed into the epidural space at the T11-12 interspace using biplanar fluoro and loss-of-resistance technique. The needle was aspirated without any return of fluid. A [employer] INFINION lead was introduced and under live AP fluoro advanced until the distal-most contact overlay the midportion of the T7 vertebral body shadow with the rest of the contacts distributed over the T8 and T9 vertebral bodies in a position just right of anatomic midline. A second Infinion lead was placed just left of anatomic midline in the same levels using the same technique. The patient was awakened and the leads  tested; impedances were good, and the patient reported good coverage with amplitudes in the 3-7 mA range. 0 silk sutures were placed in the fascia adjacent to the needles. The needles and stylets were removed under fluoroscopy with no lead migration noted. Leads were then fixed to the fascia by chest tube-type fixation into position with the sutures; repeat images were obtained to verify that there had been no lead migration.  The incision was inspected and hemostasis obtained with the bipolar cautery.  Attention was then turned to creation of a subcutaneous pocket. At the right flank, a 3 cm incision was made with a 10 blade and using the bovie and blunt dissection a pocket of size appropriate to place a SCS generator. The pocket was trialed, and found to be of adequate size. The pocket was inspected for hemostasis, which was found to be excellent. Using reverse seldinger technique, the leads were tunneled to the pocket site, and the leads inserted into the SCS generator. Impedances were checked, and all found to be excellent. The leads were then all fixed into position with a self-torquing wrench. The wiring was all carefully coiled, placed behind the generator and placed in the pocket.  Both incisions were copiously irrigated with bacitracin-containing irrigation. The lumbar incision was closed in 2 deep layers of interrupted 2-0 vicryl and the skin closed with staples. The pocket incision was closed with a deeper layer of 2-0 vicryl interrupted sutures, and the skin closed with staples. Sterile dressings were applied. Needle, sponge, and instrument counts were correct x2 at the end of the case.  The patient was then carefully awakened from anesthesia, turned supine, an abdominal binder placed, and the patient taken to the recovery room where he underwent complex spinal cord stimulator programming.  COMPLICATIONS: NONE  CONDITION: Stable throughout the course of the  procedure and immediately afterward   DISPOSITION: discharge to home, with antibiotics and pain medicine. Discussed care with the patient and spouse. Followup in clinic will be scheduled in 10-14 days.

## 2016-02-18 NOTE — Progress Notes (Signed)
Dr Margurite Auerbach in to see pt from anesthesia, States pt looks ready to go home, Aware pt going home from here with wife,

## 2016-02-18 NOTE — Anesthesia Postprocedure Evaluation (Addendum)
Anesthesia Post Note  Patient: John Stark  Procedure(s) Performed: Procedure(s) (LRB): LUMBAR SPINAL CORD STIMULATOR INSERTION (N/A)  Patient location during evaluation: PACU Anesthesia Type: MAC Level of consciousness: awake and alert Pain management: pain level controlled Vital Signs Assessment: post-procedure vital signs reviewed and stable Respiratory status: spontaneous breathing, nonlabored ventilation, respiratory function stable and patient connected to nasal cannula oxygen Cardiovascular status: stable and blood pressure returned to baseline Anesthetic complications: no Comments: -H/O atrial fibrillation. He did have 2 episodes of bradycardia in PACU associated with PONV. His HR would return to normal quickly. An ECG showed Afib at 78 with nonspecific TWA. He states he currently feels fine with no dyspnea and no nausea.  RA- oxygen saturations are 91-96 %. He is in no distress. He states he feels ready to go home. No sedation noted. No apnea noted.  We discussed deep breathing and coughing as being helpful.    Last Vitals:  Vitals:   02/18/16 1700 02/18/16 1709  BP: 128/78   Pulse: 79 79  Resp: (!) 24 17  Temp:  36.4 C    Last Pain:  Vitals:   02/18/16 0939  TempSrc:   PainSc: 6                  [name] J

## 2016-02-21 ENCOUNTER — Encounter (HOSPITAL_COMMUNITY): Payer: Self-pay | Admitting: Anesthesiology

## 2016-02-21 NOTE — Telephone Encounter (Signed)
Closed encounter °

## 2016-03-10 ENCOUNTER — Other Ambulatory Visit: Payer: Self-pay | Admitting: Cardiovascular Disease

## 2016-04-27 ENCOUNTER — Encounter: Payer: Self-pay | Admitting: Cardiovascular Disease

## 2016-04-27 ENCOUNTER — Ambulatory Visit (INDEPENDENT_AMBULATORY_CARE_PROVIDER_SITE_OTHER): Payer: Medicare Other | Admitting: Cardiovascular Disease

## 2016-04-27 VITALS — BP 126/82 | HR 96 | Ht 71.0 in | Wt 300.4 lb

## 2016-04-27 DIAGNOSIS — I4819 Other persistent atrial fibrillation: Secondary | ICD-10-CM

## 2016-04-27 DIAGNOSIS — I10 Essential (primary) hypertension: Secondary | ICD-10-CM | POA: Diagnosis not present

## 2016-04-27 DIAGNOSIS — I481 Persistent atrial fibrillation: Secondary | ICD-10-CM | POA: Diagnosis not present

## 2016-04-27 NOTE — Patient Instructions (Signed)
Medication Instructions:  Your physician recommends that you continue on your current medications as directed. Please refer to the Current Medication list given to you today.   Labwork: none  Testing/Procedures: none  Follow-Up:  Your physician recommends that you schedule a follow-up appointment in: 12 months. Please call our office around July or August to schedule this appointment.      Any Other Special Instructions Will Be Listed Below (If Applicable).     If you need a refill on your cardiac medications before your next appointment, please call your pharmacy.

## 2016-04-27 NOTE — Progress Notes (Signed)
Chief Complaint  Patient presents with  . Shortness of Breath    History of Present Illness: 80 yo Caucasian male with past medical history significant for persistent atrial fibrillation, hyperlipidemia, hypertension, BPH, GERD, COPD and arthritis who is here today for follow up. He was seen as a new patient in our office in 2010 for evaluation of an abnormal echo. His echo showed normal left ventricular systolic function with mild LVH and mild diastolic dysfunction, mild LAE, trace MR. He was seen in our office 05/22/12 after EKG in pre-op before knee surgery showed atrial fibrillation with HR of 119 bpm. He was started on Xarelto and Lopressor. Echo 05/27/12 with normal LV size and function. Successful DCCV 06/18/12. Right knee replacement [id number] without complications. He was seen in our office November 2014 and was back in atrial fibrillation. Since he was asymptomatic, we planned rate control without another DCCV.   He is here today for follow up. He has had no chest pain, palpitations, or dizziness with exercise. He has baseline dyspnea, no changes. He mowed his grass today and has some wheezing. He has been doing water aerobics three days per week.   Primary Care Physician: Donnie Coffin, MD  Past Medical History:  Diagnosis Date  . Asthma    "as a child"  . Atrial fibrillation ([place])    a. Echo 05/27/12:  Mild LVH, EF 55%, aortic sclerosis, no AS, mild LAE, mild RVE, moderate RV dysfunction, mild RAE, PASP 32;  b. Xarelto started 05/22/12  . COPD (chronic obstructive pulmonary disease) ([place])   . Dysrhythmia    a fib  . Enlarged prostate   . GERD (gastroesophageal reflux disease)   . Hearing loss in right ear   . HLD (hyperlipidemia)   . HTN (hypertension)   . Osteoarthrosis, unspecified whether generalized or localized, unspecified site   . Overweight(278.02)   . Right knee pain    awaiting knee replacement  . Shortness of breath dyspnea     Past Surgical History:    Procedure Laterality Date  . BACK SURGERY     2011  . CARDIOVERSION  06/18/2012   Procedure: CARDIOVERSION;  Surgeon: Josue Hector, MD;  Location: [facility] ENDOSCOPY;  Service: Cardiovascular;  Laterality: N/A;  . COLONOSCOPY    . SPINAL CORD STIMULATOR INSERTION N/A 02/18/2016   Procedure: LUMBAR SPINAL CORD STIMULATOR INSERTION;  Surgeon: Clydell Hakim, MD;  Location: [facility] NEURO ORS;  Service: Neurosurgery;  Laterality: N/A;  LUMBAR SPINAL CORD STIMULATOR INSERTION  . TOTAL KNEE ARTHROPLASTY Right 08/07/2012   Procedure: TOTAL KNEE ARTHROPLASTY;  Surgeon: Kerin Salen, MD;  Location: [facility];  Service: Orthopedics;  Laterality: Right;    Current Outpatient Prescriptions  Medication Sig Dispense Refill  . amLODipine (NORVASC) 5 MG tablet Take 5 mg by mouth daily.    . benazepril (LOTENSIN) 10 MG tablet Take 10 mg by mouth daily.    . Cetirizine HCl (ZYRTEC ALLERGY PO) Take 1 tablet by mouth daily.    Marland Kitchen doxazosin (CARDURA) 4 MG tablet Take 4 mg by mouth daily.      . famotidine (PEPCID) 20 MG tablet TAKE 1 TABLET BY MOUTH ONCE A DAY 30 tablet 6  . finasteride (PROSCAR) 5 MG tablet Take 5 mg by mouth daily.      . furosemide (LASIX) 40 MG tablet Take 40 mg by mouth daily.    Marland Kitchen HYDROcodone-acetaminophen (NORCO/VICODIN) 5-325 MG tablet Take 1-2 tablets by mouth every 4 (four) hours as needed for  severe pain. For pain 50 tablet 0  . metoprolol (LOPRESSOR) 50 MG tablet Take 1 tablet (50 mg total) by mouth 2 (two) times daily. 60 tablet 6  . Nutritional Supplements (JUICE PLUS FIBRE PO) Take 6 tablets by mouth daily. 2 berry tablets, 2 fruit tablets, and 2 veggie tablets    . potassium chloride (MICRO-K) 10 MEQ CR capsule Take 10 mEq by mouth daily.     Marland Kitchen PROAIR HFA 108 (90 Base) MCG/ACT inhaler Inhale 1 puff into the lungs daily as needed for shortness of breath or wheezing.    . rivaroxaban (XARELTO) 20 MG TABS tablet Take 1 tablet (20 mg total) by mouth daily. 30 tablet 6  . WELCHOL 625 MG tablet  Take 625 mg by mouth daily.     No current facility-administered medications for this visit.     Allergies  Allergen Reactions  . No Known Allergies     Social History   Social History  . Marital status: Married    Spouse name: N/A  . Number of children: 2  . Years of education: N/A   Occupational History  . Retired-purchasing Freight forwarder [employer]    Social History Main Topics  . Smoking status: Former Smoker    Packs/day: 1.00    Years: 20.00    Types: Cigarettes    Quit date: 05/22/1977  . Smokeless tobacco: Never Used     Comment: Quit 1978  . Alcohol use No  . Drug use: No  . Sexual activity: Not on file   Other Topics Concern  . Not on file   Social History Narrative   Retired. Married. Regularly exercises.     Family History  Problem Relation Age of Onset  . Cancer Other   . Heart attack Brother 75    Review of Systems:  As stated in the HPI and otherwise negative.   BP 126/82   Pulse 96   Ht 5\' 11"  (1.803 m)   Wt (!) 300 lb 6.4 oz (136.3 kg)   BMI 41.90 kg/m   Physical Examination: General: Well developed, well nourished, NAD  HEENT: OP clear, mucus membranes moist  SKIN: warm, dry. No rashes. Neuro: No focal deficits  Musculoskeletal: Muscle strength 5/5 all ext  Psychiatric: Mood and affect normal  Neck: No JVD, no carotid bruits, no thyromegaly, no lymphadenopathy.  Lungs:End exp wheezes bilaterally. No rhonci, crackles Cardiovascular: Irregular irregular. No murmurs, gallops or rubs. Abdomen:Soft. Bowel sounds present. Non-tender.  Extremities: No lower extremity edema. Pulses are 2 + in the bilateral DP/PT  EKG:  EKG is not ordered today. The ekg ordered today demonstrates  Recent Labs: 02/15/2016: BUN 24; Creatinine, Ser 1.23; Hemoglobin 12.4; Platelets 179; Potassium 4.0; Sodium 138   Lipid Panel No results found for: CHOL, TRIG, HDL, CHOLHDL, VLDL, LDLCALC, LDLDIRECT   Wt Readings from Last 3 Encounters:  04/27/16 (!) 300 lb 6.4 oz  (136.3 kg)  02/18/16 298 lb 12.8 oz (135.5 kg)  02/15/16 298 lb 12.8 oz (135.5 kg)     Other studies Reviewed: Additional studies/ records that were reviewed today include: . Review of the above records demonstrates:    Assessment and Plan:   1. Atrial fibrillation: Still in atrial fibrillation. Rate controlled. Continue Lopressor 50 mg po BID. Continue anti-coagulation with Xarelto. Since he is having no symptoms, will plan for rate control with anti-coagulation. Will not try to restore sinus rhythm with further cardioversions. He is ok with with this plan.    2.COPD: Stable.  Dyspnea likely due to his lung disease. He may benefit from seeing a pulmonary specialisit.   3. HTN: BP well controlled. No changes.   Current medicines are reviewed at length with the patient today.  The patient does not have concerns regarding medicines.  The following changes have been made:  no change  Labs/ tests ordered today include:   No orders of the defined types were placed in this encounter.   Disposition:   FU with me in 12  months  Signed, Lauree Chandler, MD 04/27/2016 4:41 PM    [facility] [address], [place],   [address] Phone: [phone]; Fax: [phone]

## 2016-06-21 ENCOUNTER — Other Ambulatory Visit: Payer: Self-pay | Admitting: Cardiovascular Disease

## 2016-06-30 ENCOUNTER — Other Ambulatory Visit: Payer: Self-pay | Admitting: Cardiovascular Disease

## 2016-08-31 ENCOUNTER — Encounter: Payer: Self-pay | Admitting: *Deleted

## 2016-09-07 ENCOUNTER — Ambulatory Visit: Payer: Medicare Other | Admitting: Anesthesiology

## 2016-09-07 ENCOUNTER — Ambulatory Visit
Admission: RE | Admit: 2016-09-07 | Discharge: 2016-09-07 | Disposition: A | Discharge status: Home / Self Care | Payer: Medicare Other | Source: Ambulatory Visit | Attending: Ophthalmology | Admitting: Ophthalmology

## 2016-09-07 ENCOUNTER — Encounter: Payer: Self-pay | Admitting: *Deleted

## 2016-09-07 ENCOUNTER — Encounter
Admission: RE | Disposition: A | Discharge status: Home / Self Care | Payer: Self-pay | Source: Ambulatory Visit | Attending: Ophthalmology

## 2016-09-07 DIAGNOSIS — E669 Obesity, unspecified: Secondary | ICD-10-CM | POA: Insufficient documentation

## 2016-09-07 DIAGNOSIS — E785 Hyperlipidemia, unspecified: Secondary | ICD-10-CM | POA: Diagnosis not present

## 2016-09-07 DIAGNOSIS — H2511 Age-related nuclear cataract, right eye: Secondary | ICD-10-CM | POA: Diagnosis not present

## 2016-09-07 DIAGNOSIS — Z79899 Other long term (current) drug therapy: Secondary | ICD-10-CM | POA: Diagnosis not present

## 2016-09-07 DIAGNOSIS — Z6841 Body Mass Index (BMI) 40.0 and over, adult: Secondary | ICD-10-CM | POA: Diagnosis not present

## 2016-09-07 DIAGNOSIS — K219 Gastro-esophageal reflux disease without esophagitis: Secondary | ICD-10-CM | POA: Insufficient documentation

## 2016-09-07 DIAGNOSIS — J449 Chronic obstructive pulmonary disease, unspecified: Secondary | ICD-10-CM | POA: Insufficient documentation

## 2016-09-07 DIAGNOSIS — M1990 Unspecified osteoarthritis, unspecified site: Secondary | ICD-10-CM | POA: Diagnosis not present

## 2016-09-07 DIAGNOSIS — I10 Essential (primary) hypertension: Secondary | ICD-10-CM | POA: Diagnosis not present

## 2016-09-07 DIAGNOSIS — G629 Polyneuropathy, unspecified: Secondary | ICD-10-CM | POA: Insufficient documentation

## 2016-09-07 DIAGNOSIS — I252 Old myocardial infarction: Secondary | ICD-10-CM | POA: Diagnosis not present

## 2016-09-07 DIAGNOSIS — Z87891 Personal history of nicotine dependence: Secondary | ICD-10-CM | POA: Diagnosis not present

## 2016-09-07 DIAGNOSIS — I4891 Unspecified atrial fibrillation: Secondary | ICD-10-CM | POA: Insufficient documentation

## 2016-09-07 HISTORY — DX: Polyneuropathy, unspecified: G62.9

## 2016-09-07 HISTORY — PX: CATARACT EXTRACTION W/PHACO: SHX586

## 2016-09-07 HISTORY — DX: Edema, unspecified: R60.9

## 2016-09-07 SURGERY — PHACOEMULSIFICATION, CATARACT, WITH IOL INSERTION
Anesthesia: Monitor Anesthesia Care | Site: Eye | Laterality: Right | Wound class: Clean

## 2016-09-07 MED ORDER — SODIUM CHLORIDE 0.9 % IV SOLN
INTRAVENOUS | Status: DC
  Administered 2016-09-07 (×2): via INTRAVENOUS

## 2016-09-07 MED ORDER — FENTANYL CITRATE (PF) 100 MCG/2ML IJ SOLN
INTRAMUSCULAR | Status: DC | PRN
  Administered 2016-09-07: 50 ug via INTRAVENOUS

## 2016-09-07 MED ORDER — EPINEPHRINE PF 1 MG/ML IJ SOLN
INTRAMUSCULAR | Status: AC
  Filled 2016-09-07: qty 2

## 2016-09-07 MED ORDER — MIDAZOLAM HCL 2 MG/2ML IJ SOLN
INTRAMUSCULAR | Status: AC
  Filled 2016-09-07: qty 2

## 2016-09-07 MED ORDER — BSS IO SOLN
INTRAOCULAR | Status: DC | PRN
  Administered 2016-09-07: 08:00:00 via OPHTHALMIC

## 2016-09-07 MED ORDER — MIDAZOLAM HCL 2 MG/2ML IJ SOLN
INTRAMUSCULAR | Status: DC | PRN
  Administered 2016-09-07 (×2): 1 mg via INTRAVENOUS

## 2016-09-07 MED ORDER — CARBACHOL 0.01 % IO SOLN
INTRAOCULAR | Status: DC | PRN
  Administered 2016-09-07: 0.5 mL via INTRAOCULAR

## 2016-09-07 MED ORDER — SODIUM HYALURONATE 10 MG/ML IO SOLN
INTRAOCULAR | Status: AC
  Filled 2016-09-07: qty 0.85

## 2016-09-07 MED ORDER — ARMC OPHTHALMIC DILATING DROPS
1.0000 "application " | OPHTHALMIC | Status: AC
  Administered 2016-09-07 (×3): 1 via OPHTHALMIC

## 2016-09-07 MED ORDER — SODIUM HYALURONATE 23 MG/ML IO SOLN
INTRAOCULAR | Status: AC
  Filled 2016-09-07: qty 0.6

## 2016-09-07 MED ORDER — ARMC OPHTHALMIC DILATING DROPS
OPHTHALMIC | Status: AC
  Administered 2016-09-07: 1 via OPHTHALMIC
  Filled 2016-09-07: qty 0.4

## 2016-09-07 MED ORDER — POVIDONE-IODINE 5 % OP SOLN
OPHTHALMIC | Status: DC | PRN
  Administered 2016-09-07: 1 via OPHTHALMIC

## 2016-09-07 MED ORDER — SODIUM HYALURONATE 23 MG/ML IO SOLN
INTRAOCULAR | Status: DC | PRN
  Administered 2016-09-07: 0.6 mL via INTRAOCULAR

## 2016-09-07 MED ORDER — SODIUM HYALURONATE 10 MG/ML IO SOLN
INTRAOCULAR | Status: DC | PRN
  Administered 2016-09-07: 0.85 mL via INTRAOCULAR

## 2016-09-07 MED ORDER — FENTANYL CITRATE (PF) 100 MCG/2ML IJ SOLN
INTRAMUSCULAR | Status: AC
  Filled 2016-09-07: qty 2

## 2016-09-07 MED ORDER — MOXIFLOXACIN HCL 0.5 % OP SOLN: 1.0000 [drp] | OPHTHALMIC | Status: DC | PRN

## 2016-09-07 MED ORDER — LIDOCAINE HCL (PF) 4 % IJ SOLN
INTRAMUSCULAR | Status: DC | PRN
  Administered 2016-09-07: 4 mL via OPHTHALMIC

## 2016-09-07 MED ORDER — MOXIFLOXACIN HCL 0.5 % OP SOLN
OPHTHALMIC | Status: AC
  Filled 2016-09-07: qty 3

## 2016-09-07 SURGICAL SUPPLY — 20 items
CANNULA ANT/CHMB 27GA (MISCELLANEOUS) ×6 IMPLANT
CUP MEDICINE 2OZ PLAST GRAD ST (MISCELLANEOUS) ×3 IMPLANT
DISSECTOR HYDRO NUCLEUS 50X22 (MISCELLANEOUS) ×3 IMPLANT
GLOVE BIO SURGEON STRL SZ8 (GLOVE) ×3 IMPLANT
GLOVE BIOGEL M 6.5 STRL (GLOVE) ×3 IMPLANT
GLOVE SURG LX 7.5 STRW (GLOVE) ×2
GLOVE SURG LX STRL 7.5 STRW (GLOVE) ×1 IMPLANT
GOWN STRL REUS W/ TWL LRG LVL3 (GOWN DISPOSABLE) ×2 IMPLANT
GOWN STRL REUS W/TWL LRG LVL3 (GOWN DISPOSABLE) ×4
LENS IOL TECNIS ITEC 23.0 (Intraocular Lens) ×3 IMPLANT
PACK CATARACT (MISCELLANEOUS) ×3 IMPLANT
PACK CATARACT KING (MISCELLANEOUS) ×3 IMPLANT
PACK EYE AFTER SURG (MISCELLANEOUS) ×3 IMPLANT
SOL BSS BAG (MISCELLANEOUS) ×3
SOLUTION BSS BAG (MISCELLANEOUS) ×1 IMPLANT
SYR 3ML LL SCALE MARK (SYRINGE) ×6 IMPLANT
SYR 5ML LL (SYRINGE) ×3 IMPLANT
SYR TB 1ML 27GX1/2 LL (SYRINGE) ×3 IMPLANT
WATER STERILE IRR 250ML POUR (IV SOLUTION) ×3 IMPLANT
WIPE NON LINTING 3.25X3.25 (MISCELLANEOUS) ×3 IMPLANT

## 2016-09-07 NOTE — Op Note (Signed)
OPERATIVE NOTE  John Stark [id number] 09/07/2016   PREOPERATIVE DIAGNOSIS:  Nuclear sclerotic cataract right eye.  H25.11   POSTOPERATIVE DIAGNOSIS:    Nuclear sclerotic cataract right eye.     PROCEDURE:  Phacoemusification with posterior chamber intraocular lens placement of the right eye   LENS:   Implant Name Type Inv. Item Serial No. Manufacturer Lot No. LRB No. Used  LENS IOL DIOP 23.0 - [id number] Intraocular Lens LENS IOL DIOP 23.0 [id number]   Right 1       PCB00 +23.0   ULTRASOUND TIME: 0 minutes 42.9 seconds.  CDE 4.52   SURGEON:  Benay Pillow, MD, MPH  ANESTHESIOLOGIST: Anesthesiologist: Emmie Niemann, MD CRNA: Darlyne Russian, CRNA   ANESTHESIA:  Topical with tetracaine drops augmented with 1% preservative-free intracameral lidocaine.  ESTIMATED BLOOD LOSS: less than 1 mL.   COMPLICATIONS:  None.   DESCRIPTION OF PROCEDURE:  The patient was identified in the holding room and transported to the operating room and placed in the supine position under the operating microscope.  The right eye was identified as the operative eye and it was prepped and draped in the usual sterile ophthalmic fashion.   A 1.0 millimeter clear-corneal paracentesis was made at the 10:30 position. 0.5 ml of preservative-free 1% lidocaine with epinephrine was injected into the anterior chamber.  The anterior chamber was filled with Healon 5 viscoelastic.  A 2.4 millimeter keratome was used to make a near-clear corneal incision at the 8:00 position.  A curvilinear capsulorrhexis was made with a cystotome and capsulorrhexis forceps.  Balanced salt solution was used to hydrodissect and hydrodelineate the nucleus.   Phacoemulsification was then used in stop and chop fashion to remove the lens nucleus and epinucleus.  The remaining cortex was then removed using the irrigation and aspiration handpiece.  There was significant PSC component which was successfully polished off the posterior  capsule.  Healon was then placed into the capsular bag to distend it for lens placement.  A lens was then injected into the capsular bag.  The remaining viscoelastic was aspirated.   Wounds were hydrated with balanced salt solution.  The anterior chamber was inflated to a physiologic pressure with balanced salt solution.   Intracameral vigamox 0.1 mL undiluted was injected into the eye and a drop placed onto the ocular surface.  The patient was obese and breathing heavily causing some movement throughout the case.  There was moderate chemosis but this did not siginificantly affect visualization.   No wound leaks were noted.  The patient was taken to the recovery room in stable condition without complications of anesthesia or surgery  Benay Pillow 09/07/2016, 8:58 AM

## 2016-09-07 NOTE — Transfer of Care (Signed)
Immediate Anesthesia Transfer of Care Note  Patient: ERNEST POPOWSKI  Procedure(s) Performed: Procedure(s) with comments: CATARACT EXTRACTION PHACO AND INTRAOCULAR LENS PLACEMENT (IOC) (Right) - [place] 42.9 AP% 10.0 CDE 4.52 Fluid pack lot # [id number]  Patient Location: PACU  Anesthesia Type:MAC  Level of Consciousness: awake, alert  and oriented  Airway & Oxygen Therapy: Patient Spontanous Breathing  Post-op Assessment: Report given to RN and Post -op Vital signs reviewed and stable  Post vital signs: Reviewed and stable  Last Vitals:  Vitals:   09/07/16 0646 09/07/16 0900  BP: (!) 156/113 131/71  Pulse: 86 79  Resp: (!) 22 20  Temp: 36.5 C     Last Pain:  Vitals:   09/07/16 0900  TempSrc: Tympanic         Complications: No apparent anesthesia complications

## 2016-09-07 NOTE — Anesthesia Postprocedure Evaluation (Signed)
Anesthesia Post Note  Patient: KAIO KUHLMAN  Procedure(s) Performed: Procedure(s) (LRB): CATARACT EXTRACTION PHACO AND INTRAOCULAR LENS PLACEMENT (IOC) (Right)  Patient location during evaluation: PACU Anesthesia Type: MAC Level of consciousness: awake and alert and oriented Pain management: pain level controlled Vital Signs Assessment: post-procedure vital signs reviewed and stable Respiratory status: spontaneous breathing, nonlabored ventilation, respiratory function stable and patient connected to nasal cannula oxygen Cardiovascular status: stable and blood pressure returned to baseline Anesthetic complications: no     Last Vitals:  Vitals:   09/07/16 0646 09/07/16 0900  BP: (!) 156/113 131/71  Pulse: 86 79  Resp: (!) 22 20  Temp: 36.5 C     Last Pain:  Vitals:   09/07/16 0900  TempSrc: Tympanic                 Giuseppina Quinones

## 2016-09-07 NOTE — H&P (Signed)
The History and Physical notes are on paper, have been signed, and are to be scanned.   I have examined the patient and there are no changes to the H&P.   Benay Pillow 09/07/2016 8:17 AM

## 2016-09-07 NOTE — Anesthesia Preprocedure Evaluation (Addendum)
Anesthesia Evaluation  Patient identified by MRN, date of birth, ID band Patient awake    Reviewed: Allergy & Precautions, NPO status , Patient's Chart, lab work & pertinent test results  History of Anesthesia Complications Negative for: history of anesthetic complications  Airway Mallampati: III  TM Distance: >3 FB Neck ROM: Full    Dental  (+) Poor Dentition, Chipped   Pulmonary asthma , COPD,  COPD inhaler, former smoker,    breath sounds clear to auscultation- rhonchi (-) wheezing      Cardiovascular hypertension, Pt. on medications (-) CAD and (-) Past MI + dysrhythmias Atrial Fibrillation  Rhythm:Regular Rate:Normal - Systolic murmurs and - Diastolic murmurs    Neuro/Psych negative neurological ROS  negative psych ROS   GI/Hepatic Neg liver ROS, GERD  ,  Endo/Other  negative endocrine ROSneg diabetes  Renal/GU negative Renal ROS     Musculoskeletal  (+) Arthritis ,   Abdominal (+) + obese,   Peds  Hematology negative hematology ROS (+)   Anesthesia Other Findings Past Medical History: No date: Asthma     Comment: "as a child" No date: Atrial fibrillation ([place])     Comment: a. Echo 05/27/12:  Mild LVH, EF 55%, aortic               sclerosis, no AS, mild LAE, mild RVE, moderate               RV dysfunction, mild RAE, PASP 32;  b. Xarelto               started 05/22/12 No date: COPD (chronic obstructive pulmonary disease) (* No date: Dysrhythmia     Comment: a fib No date: Edema     Comment: FEET/LEGS No date: Enlarged prostate No date: GERD (gastroesophageal reflux disease) No date: Hearing loss in right ear No date: HLD (hyperlipidemia) No date: HTN (hypertension) No date: Neuropathy (HCC) No date: Osteoarthrosis, unspecified whether generalize* No date: Overweight(278.02) No date: Right knee pain     Comment: awaiting knee replacement No date: Shortness of breath dyspnea No date: Wheezing   Reproductive/Obstetrics                             Anesthesia Physical Anesthesia Plan  ASA: III  Anesthesia Plan: MAC   Post-op Pain Management:    Induction: Intravenous  Airway Management Planned: Natural Airway  Additional Equipment:   Intra-op Plan:   Post-operative Plan:   Informed Consent: I have reviewed the patients History and Physical, chart, labs and discussed the procedure including the risks, benefits and alternatives for the proposed anesthesia with the patient or authorized representative who has indicated his/her understanding and acceptance.     Plan Discussed with: CRNA and Anesthesiologist  Anesthesia Plan Comments:         Anesthesia Quick Evaluation

## 2016-09-07 NOTE — Anesthesia Post-op Follow-up Note (Cosign Needed)
Anesthesia QCDR form completed.        

## 2016-09-22 ENCOUNTER — Other Ambulatory Visit: Payer: Self-pay | Admitting: Cardiovascular Disease

## 2016-09-22 MED ORDER — METOPROLOL TARTRATE 50 MG PO TABS: 50.0000 mg | ORAL_TABLET | Freq: Two times a day (BID) | ORAL | 7 refills | Status: DC

## 2016-09-22 NOTE — Telephone Encounter (Signed)
Medication sent to pt's pharmacy as requested. Confirmation received.

## 2016-10-03 ENCOUNTER — Encounter: Payer: Self-pay | Admitting: *Deleted

## 2016-10-09 ENCOUNTER — Other Ambulatory Visit: Payer: Self-pay | Admitting: Cardiovascular Disease

## 2016-10-12 ENCOUNTER — Encounter: Payer: Self-pay | Admitting: Anesthesiology

## 2016-10-12 ENCOUNTER — Ambulatory Visit: Payer: Medicare Other | Admitting: Anesthesiology

## 2016-10-12 ENCOUNTER — Encounter
Admission: RE | Disposition: A | Discharge status: Home / Self Care | Payer: Self-pay | Source: Ambulatory Visit | Attending: Ophthalmology

## 2016-10-12 ENCOUNTER — Ambulatory Visit
Admission: RE | Admit: 2016-10-12 | Discharge: 2016-10-12 | Disposition: A | Discharge status: Home / Self Care | Payer: Medicare Other | Source: Ambulatory Visit | Attending: Ophthalmology | Admitting: Ophthalmology

## 2016-10-12 DIAGNOSIS — Z7901 Long term (current) use of anticoagulants: Secondary | ICD-10-CM | POA: Insufficient documentation

## 2016-10-12 DIAGNOSIS — H2512 Age-related nuclear cataract, left eye: Secondary | ICD-10-CM | POA: Insufficient documentation

## 2016-10-12 DIAGNOSIS — Z87891 Personal history of nicotine dependence: Secondary | ICD-10-CM | POA: Insufficient documentation

## 2016-10-12 DIAGNOSIS — Z96659 Presence of unspecified artificial knee joint: Secondary | ICD-10-CM | POA: Insufficient documentation

## 2016-10-12 DIAGNOSIS — Z79891 Long term (current) use of opiate analgesic: Secondary | ICD-10-CM | POA: Diagnosis not present

## 2016-10-12 DIAGNOSIS — I252 Old myocardial infarction: Secondary | ICD-10-CM | POA: Diagnosis not present

## 2016-10-12 DIAGNOSIS — Z791 Long term (current) use of non-steroidal anti-inflammatories (NSAID): Secondary | ICD-10-CM | POA: Diagnosis not present

## 2016-10-12 DIAGNOSIS — K219 Gastro-esophageal reflux disease without esophagitis: Secondary | ICD-10-CM | POA: Insufficient documentation

## 2016-10-12 DIAGNOSIS — I4891 Unspecified atrial fibrillation: Secondary | ICD-10-CM | POA: Diagnosis not present

## 2016-10-12 DIAGNOSIS — Z79899 Other long term (current) drug therapy: Secondary | ICD-10-CM | POA: Diagnosis not present

## 2016-10-12 DIAGNOSIS — I10 Essential (primary) hypertension: Secondary | ICD-10-CM | POA: Insufficient documentation

## 2016-10-12 DIAGNOSIS — J449 Chronic obstructive pulmonary disease, unspecified: Secondary | ICD-10-CM | POA: Insufficient documentation

## 2016-10-12 HISTORY — PX: CATARACT EXTRACTION W/PHACO: SHX586

## 2016-10-12 SURGERY — PHACOEMULSIFICATION, CATARACT, WITH IOL INSERTION
Anesthesia: Monitor Anesthesia Care | Site: Eye | Laterality: Left | Wound class: Clean

## 2016-10-12 MED ORDER — SODIUM HYALURONATE 10 MG/ML IO SOLN
INTRAOCULAR | Status: DC | PRN
  Administered 2016-10-12: 0.55 mL via INTRAOCULAR

## 2016-10-12 MED ORDER — MOXIFLOXACIN HCL 0.5 % OP SOLN
OPHTHALMIC | Status: AC
  Filled 2016-10-12: qty 3

## 2016-10-12 MED ORDER — EPINEPHRINE PF 1 MG/ML IJ SOLN
INTRAMUSCULAR | Status: AC
  Filled 2016-10-12: qty 2

## 2016-10-12 MED ORDER — MIDAZOLAM HCL 2 MG/2ML IJ SOLN
INTRAMUSCULAR | Status: DC | PRN
  Administered 2016-10-12: 1 mg via INTRAVENOUS

## 2016-10-12 MED ORDER — SODIUM HYALURONATE 23 MG/ML IO SOLN
INTRAOCULAR | Status: AC
  Filled 2016-10-12: qty 0.6

## 2016-10-12 MED ORDER — ARMC OPHTHALMIC DILATING DROPS
OPHTHALMIC | Status: AC
  Administered 2016-10-12: 1 via OPHTHALMIC
  Filled 2016-10-12: qty 0.4

## 2016-10-12 MED ORDER — SODIUM HYALURONATE 23 MG/ML IO SOLN
INTRAOCULAR | Status: DC | PRN
Start: 2016-10-12 — End: 2016-10-12
  Administered 2016-10-12: 0.6 mL via INTRAOCULAR

## 2016-10-12 MED ORDER — SODIUM CHLORIDE 0.9 % IV SOLN
INTRAVENOUS | Status: DC
  Administered 2016-10-12: 09:00:00 via INTRAVENOUS

## 2016-10-12 MED ORDER — ARMC OPHTHALMIC DILATING DROPS
1.0000 "application " | OPHTHALMIC | Status: AC
  Administered 2016-10-12 (×3): 1 via OPHTHALMIC

## 2016-10-12 MED ORDER — MOXIFLOXACIN HCL 0.5 % OP SOLN: 1.0000 [drp] | OPHTHALMIC | Status: DC | PRN

## 2016-10-12 MED ORDER — EPINEPHRINE PF 1 MG/ML IJ SOLN
INTRAOCULAR | Status: DC | PRN
  Administered 2016-10-12: 11:00:00 via OPHTHALMIC

## 2016-10-12 MED ORDER — FENTANYL CITRATE (PF) 100 MCG/2ML IJ SOLN
INTRAMUSCULAR | Status: AC
  Filled 2016-10-12: qty 2

## 2016-10-12 MED ORDER — MIDAZOLAM HCL 2 MG/2ML IJ SOLN
INTRAMUSCULAR | Status: AC
  Filled 2016-10-12: qty 2

## 2016-10-12 MED ORDER — CARBACHOL 0.01 % IO SOLN
INTRAOCULAR | Status: DC | PRN
  Administered 2016-10-12: 0.5 mL via INTRAOCULAR

## 2016-10-12 MED ORDER — FENTANYL CITRATE (PF) 100 MCG/2ML IJ SOLN
INTRAMUSCULAR | Status: DC | PRN
  Administered 2016-10-12: 50 ug via INTRAVENOUS

## 2016-10-12 MED ORDER — TETRACAINE HCL 0.5 % OP SOLN
OPHTHALMIC | Status: AC
  Filled 2016-10-12: qty 2

## 2016-10-12 MED ORDER — POVIDONE-IODINE 5 % OP SOLN
OPHTHALMIC | Status: AC
  Filled 2016-10-12: qty 30

## 2016-10-12 MED ORDER — SODIUM HYALURONATE 10 MG/ML IO SOLN
INTRAOCULAR | Status: AC
  Filled 2016-10-12: qty 0.85

## 2016-10-12 MED ORDER — LIDOCAINE HCL (PF) 2 % IJ SOLN
INTRAMUSCULAR | Status: AC
  Filled 2016-10-12: qty 2

## 2016-10-12 MED ORDER — LIDOCAINE HCL (PF) 4 % IJ SOLN
INTRAOCULAR | Status: DC | PRN
  Administered 2016-10-12: 4 mL via OPHTHALMIC

## 2016-10-12 MED ORDER — MOXIFLOXACIN HCL 0.5 % OP SOLN
OPHTHALMIC | Status: DC | PRN
  Administered 2016-10-12: 0.2 mL via OPHTHALMIC

## 2016-10-12 SURGICAL SUPPLY — 20 items
CANNULA ANT/CHMB 27GA (MISCELLANEOUS) ×6 IMPLANT
CUP MEDICINE 2OZ PLAST GRAD ST (MISCELLANEOUS) ×3 IMPLANT
DISSECTOR HYDRO NUCLEUS 50X22 (MISCELLANEOUS) ×3 IMPLANT
GLOVE BIO SURGEON STRL SZ8 (GLOVE) ×3 IMPLANT
GLOVE BIOGEL M 6.5 STRL (GLOVE) ×3 IMPLANT
GLOVE SURG LX 7.5 STRW (GLOVE) ×2
GLOVE SURG LX STRL 7.5 STRW (GLOVE) ×1 IMPLANT
GOWN STRL REUS W/ TWL LRG LVL3 (GOWN DISPOSABLE) ×2 IMPLANT
GOWN STRL REUS W/TWL LRG LVL3 (GOWN DISPOSABLE) ×4
LENS IOL TECNIS ITEC 22.5 (Intraocular Lens) ×3 IMPLANT
PACK CATARACT (MISCELLANEOUS) ×3 IMPLANT
PACK CATARACT KING (MISCELLANEOUS) ×3 IMPLANT
PACK EYE AFTER SURG (MISCELLANEOUS) ×3 IMPLANT
SOL BSS BAG (MISCELLANEOUS) ×3
SOLUTION BSS BAG (MISCELLANEOUS) ×1 IMPLANT
SYR 3ML LL SCALE MARK (SYRINGE) ×6 IMPLANT
SYR 5ML LL (SYRINGE) ×3 IMPLANT
SYR TB 1ML 27GX1/2 LL (SYRINGE) ×3 IMPLANT
WATER STERILE IRR 250ML POUR (IV SOLUTION) ×3 IMPLANT
WIPE NON LINTING 3.25X3.25 (MISCELLANEOUS) ×3 IMPLANT

## 2016-10-12 NOTE — Transfer of Care (Signed)
Immediate Anesthesia Transfer of Care Note  Patient: John Stark  Procedure(s) Performed: Procedure(s) with comments: CATARACT EXTRACTION PHACO AND INTRAOCULAR LENS PLACEMENT (IOC) (Left) - [place] 34.2 AP% 9.0 CDE 3.17 Fluid pack lot # [id number]  Patient Location: PACU  Anesthesia Type:MAC  Level of Consciousness: awake  Airway & Oxygen Therapy: Patient Spontanous Breathing  Post-op Assessment: Report given to RN and Post -op Vital signs reviewed and stable  Post vital signs: Reviewed and stable  Last Vitals:  Vitals:   10/12/16 0913 10/12/16 1104  BP: 132/85 (!) 125/93  Pulse: 88 82  Resp: 20 18  Temp:  36.6 C    Last Pain:  Vitals:   10/12/16 0837  TempSrc: Oral  PainSc: 5          Complications: No apparent anesthesia complications

## 2016-10-12 NOTE — Anesthesia Procedure Notes (Signed)
Procedure Name: MAC Date/Time: 10/12/2016 10:29 AM Performed by: Allean Found Pre-anesthesia Checklist: Patient identified, Emergency Drugs available, Suction available, Patient being monitored and Timeout performed Patient Re-evaluated:Patient Re-evaluated prior to inductionOxygen Delivery Method: Nasal cannula

## 2016-10-12 NOTE — H&P (Signed)
The History and Physical notes are on paper, have been signed, and are to be scanned.   I have examined the patient and there are no changes to the H&P.   Benay Pillow 10/12/2016 10:24 AM

## 2016-10-12 NOTE — Op Note (Signed)
OPERATIVE NOTE  John Stark [id number] 10/12/2016   PREOPERATIVE DIAGNOSIS:  Nuclear sclerotic cataract left eye.  H25.12   POSTOPERATIVE DIAGNOSIS:    Nuclear sclerotic cataract left eye.     PROCEDURE:  Phacoemusification with posterior chamber intraocular lens placement of the left eye   LENS:   Implant Name Type Inv. Item Serial No. Manufacturer Lot No. LRB No. Used  LENS IOL DIOP 22.5 - [id number] Intraocular Lens LENS IOL DIOP 22.5 [id number] AMO   Left 1       PCB00 +22.5   ULTRASOUND TIME: 0 minutes 34.2 seconds.  CDE 3.17   SURGEON:  Benay Pillow, MD, MPH   ANESTHESIA:  Topical with tetracaine drops augmented with 1% preservative-free intracameral lidocaine.  ESTIMATED BLOOD LOSS: <1 mL   COMPLICATIONS:  None.   DESCRIPTION OF PROCEDURE:  The patient was identified in the holding room and transported to the operating room and placed in the supine position under the operating microscope.  The left eye was identified as the operative eye and it was prepped and draped in the usual sterile ophthalmic fashion.   A 1.0 millimeter clear-corneal paracentesis was made at the 5:00 position. 0.5 ml of preservative-free 1% lidocaine with epinephrine was injected into the anterior chamber.  The anterior chamber was filled with Healon 5 viscoelastic.  A 2.4 millimeter keratome was used to make a near-clear corneal incision at the 2:00 position.  A curvilinear capsulorrhexis was made with a cystotome and capsulorrhexis forceps.  Balanced salt solution was used to hydrodissect and hydrodelineate the nucleus.   Phacoemulsification was then used in stop and chop fashion to remove the lens nucleus and epinucleus.  The remaining cortex was then removed using the irrigation and aspiration handpiece. Healon was then placed into the capsular bag to distend it for lens placement.    There was a small amount of residual posterior capsular opacification centrally that could not be polished  off, I believe it involved the posterior side of the posterior capsule.  A lens was then injected into the capsular bag.  The remaining viscoelastic was aspirated.   Wounds were hydrated with balanced salt solution.  The anterior chamber was inflated to a physiologic pressure with balanced salt solution.  Intracameral vigamox 0.1 mL undiltued was injected into the eye and a drop placed onto the ocular surface. No wound leaks were noted.  The patient was taken to the recovery room in stable condition without complications of anesthesia or surgery  Benay Pillow 10/12/2016, 11:03 AM

## 2016-10-12 NOTE — Anesthesia Post-op Follow-up Note (Cosign Needed)
Anesthesia QCDR form completed.        

## 2016-10-12 NOTE — Addendum Note (Signed)
Addendum  created 10/12/16 1504 by Andria Frames, MD   Anesthesia Event edited, Anesthesia Staff edited

## 2016-10-12 NOTE — Anesthesia Preprocedure Evaluation (Signed)
Anesthesia Evaluation  Patient identified by MRN, date of birth, ID band Patient awake    Reviewed: Allergy & Precautions, NPO status , Patient's Chart, lab work & pertinent test results  History of Anesthesia Complications Negative for: history of anesthetic complications  Airway Mallampati: III  TM Distance: >3 FB Neck ROM: Full    Dental  (+) Poor Dentition, Chipped   Pulmonary shortness of breath, asthma , COPD,  COPD inhaler, former smoker,    breath sounds clear to auscultation- rhonchi (-) wheezing      Cardiovascular hypertension, Pt. on medications (-) CAD and (-) Past MI + dysrhythmias Atrial Fibrillation  Rhythm:Regular Rate:Normal - Systolic murmurs and - Diastolic murmurs    Neuro/Psych negative neurological ROS  negative psych ROS   GI/Hepatic Neg liver ROS, GERD  ,  Endo/Other  negative endocrine ROSneg diabetes  Renal/GU negative Renal ROS     Musculoskeletal  (+) Arthritis ,   Abdominal (+) + obese,   Peds  Hematology negative hematology ROS (+)   Anesthesia Other Findings Past Medical History: No date: Asthma     Comment: "as a child" No date: Atrial fibrillation ([place])     Comment: a. Echo 05/27/12:  Mild LVH, EF 55%, aortic               sclerosis, no AS, mild LAE, mild RVE, moderate               RV dysfunction, mild RAE, PASP 32;  b. Xarelto               started 05/22/12 No date: COPD (chronic obstructive pulmonary disease) (* No date: Dysrhythmia     Comment: a fib No date: Edema     Comment: FEET/LEGS No date: Enlarged prostate No date: GERD (gastroesophageal reflux disease) No date: Hearing loss in right ear No date: HLD (hyperlipidemia) No date: HTN (hypertension) No date: Neuropathy (HCC) No date: Osteoarthrosis, unspecified whether generalize* No date: Overweight(278.02) No date: Right knee pain     Comment: awaiting knee replacement No date: Shortness of breath dyspnea No  date: Wheezing   Reproductive/Obstetrics                             Anesthesia Physical  Anesthesia Plan  ASA: III  Anesthesia Plan: MAC   Post-op Pain Management:    Induction: Intravenous  Airway Management Planned: Natural Airway  Additional Equipment:   Intra-op Plan:   Post-operative Plan:   Informed Consent: I have reviewed the patients History and Physical, chart, labs and discussed the procedure including the risks, benefits and alternatives for the proposed anesthesia with the patient or authorized representative who has indicated his/her understanding and acceptance.     Plan Discussed with: CRNA and Anesthesiologist  Anesthesia Plan Comments:         Anesthesia Quick Evaluation

## 2016-10-12 NOTE — Anesthesia Postprocedure Evaluation (Signed)
Anesthesia Post Note  Patient: John Stark  Procedure(s) Performed: Procedure(s) (LRB): CATARACT EXTRACTION PHACO AND INTRAOCULAR LENS PLACEMENT (IOC) (Left)  Patient location during evaluation: Short Stay Anesthesia Type: MAC Level of consciousness: awake Pain management: pain level controlled Vital Signs Assessment: post-procedure vital signs reviewed and stable Respiratory status: spontaneous breathing Cardiovascular status: blood pressure returned to baseline and stable Postop Assessment: no headache Anesthetic complications: no     Last Vitals:  Vitals:   10/12/16 0913 10/12/16 1104  BP: 132/85 (!) 125/93  Pulse: 88 82  Resp: 20 18  Temp:  36.6 C    Last Pain:  Vitals:   10/12/16 0837  TempSrc: Oral  PainSc: 5                  Buckner [place]

## 2016-10-12 NOTE — Discharge Instructions (Signed)
Eye Surgery Discharge Instructions  Expect mild scratchy sensation or mild soreness. DO NOT RUB YOUR EYE!  The day of surgery:  Minimal physical activity, but bed rest is not required  No reading, computer work, or close hand work  No bending, lifting, or straining.  May watch TV  For 24 hours:  No driving, legal decisions, or alcoholic beverages  Safety precautions  Eat anything you prefer: It is better to start with liquids, then soup then solid foods.  _____ Eye patch should be worn until postoperative exam tomorrow.  ____ Solar shield eyeglasses should be worn for comfort in the sunlight/patch while sleeping  Resume all regular medications including aspirin or Coumadin if these were discontinued prior to surgery. You may shower, bathe, shave, or wash your hair. Tylenol may be taken for mild discomfort.  Call your doctor if you experience significant pain, nausea, or vomiting, fever > 101 or other signs of infection. [phone] or [phone] Specific instructions:  Follow-up Information    Benay Pillow, MD Follow up.   Specialty:  Ophthalmology Why:  April 20 at 10:45am Contact information: [address] [place] [address] [phone]

## 2016-10-13 ENCOUNTER — Encounter: Payer: Self-pay | Admitting: Ophthalmology

## 2016-10-26 ENCOUNTER — Telehealth: Payer: Self-pay | Admitting: Cardiovascular Disease

## 2016-10-26 NOTE — Telephone Encounter (Signed)
Patient made aware of recommendations & verbalized understanding.

## 2016-10-26 NOTE — Telephone Encounter (Signed)
There are no interactions between these two medications. Gabapentin is ok to take - would advise pt to watch for increase in sedation since this is a common side effect. Would recommend that pt tries to limit Celebrex use since it can increase bleeding risk and she already takes Xarelto.

## 2016-10-26 NOTE — Telephone Encounter (Signed)
New Message:    Pt family doctor told him to check with Dr Angelena Form and see if it was alright for him to take both Gabapentin 300 mg and Celecoxib 200 mg?

## 2017-03-30 ENCOUNTER — Telehealth: Payer: Self-pay | Admitting: Cardiovascular Disease

## 2017-03-30 ENCOUNTER — Other Ambulatory Visit: Payer: Self-pay | Admitting: Cardiovascular Disease

## 2017-03-30 ENCOUNTER — Other Ambulatory Visit: Payer: Self-pay | Admitting: Urology

## 2017-03-30 NOTE — Telephone Encounter (Signed)
New message    Marlowe Kays from Urology calling regarding holding Xarelto.      [facility] Pre-operative Risk Assessment    Request for surgical clearance:  1. What type of surgery is being performed? transurethral resection of bladder tumor  2. When is this surgery scheduled? 11/9  3. Are there any medications that need to be held prior to surgery and how long?XARELTO 20 MG TABS tablet, 2 days prior  4. Practice name and name of physician performing surgery?  Dr Kathie Rhodes  5. What is your office phone and fax number? [phone]  6. Anesthesia type (None, local, MAC, general) ?    [place] 03/30/2017, 2:34 PM  _________________________________________________________________   (provider comments below)

## 2017-03-30 NOTE — Telephone Encounter (Signed)
According to the documentation, all they need is information on holding the Xarelto. Forwarded to M. Alva Garnet, PA-C 03/30/2017 5:34 PM [place] [phone]

## 2017-04-02 NOTE — Telephone Encounter (Signed)
Pt takes Xarelto for afib with CHADS2VASc of 4 (HTN, CHF, age x2). CrCl is 93. Ok to hold Xarelto for 2 days prior to procedure. Clearance faxed to below number.

## 2017-04-03 NOTE — Telephone Encounter (Signed)
Per Suanne Marker, all requested was xarelto holding instructions.  Please notify MD and pt.

## 2017-04-30 NOTE — Pre-Procedure Instructions (Signed)
LMOM for Coni to fax surgical clearance to [facility] pre surgical testing.

## 2017-04-30 NOTE — Patient Instructions (Signed)
John Stark  04/30/2017   Your procedure is scheduled on: Friday, Nov. 9, 2018   Surgery time: 11:45AM-1:15PM   Report to [facility]  elevators to 3rd floor to  [place] at 9:45 AM.    Call this number if you have problems the morning of surgery [phone]   Remember: ONLY 1 PERSON MAY GO WITH YOU TO SHORT STAY TO GET  READY MORNING OF [place].   Do not eat food or drink liquids :After Midnight.     Take these medicines the morning of surgery with A SIP OF WATER: Amlodipine (Norvasc), Metoprolol, Potassium, Welchol   Use inhalers per normal routine and bring day of surgery              You may not have any metal on your body including jewelry and body piercings               Do not wear lotions, powders or perfumes, deodorant                          Men may shave face and neck.   Do not bring valuables to the hospital. [place].   Contacts, dentures or bridgework may not be worn into surgery    Patients discharged the day of surgery will not be allowed to drive home.   Name and phone number of your driver:  Special Instructions: N/A              Please read over the following fact sheets you were given: _____________________________________________________________________             [facility] - Preparing for Surgery Before surgery, you can play an important role.  Because skin is not sterile, your skin needs to be as free of germs as possible.  You can reduce the number of germs on your skin by washing with CHG (chlorahexidine gluconate) soap before surgery.  CHG is an antiseptic cleaner which kills germs and bonds with the skin to continue killing germs even after washing. Please DO NOT use if you have an allergy to CHG or antibacterial soaps.  If your skin becomes reddened/irritated stop using the CHG and inform your nurse when you arrive at  Short Stay. Do not shave (including legs and underarms) for at least 48 hours prior to the first CHG shower.  You may shave your face/neck.  Please follow these instructions carefully:  1.  Shower with CHG Soap the night before surgery and the  morning of Surgery.  2.  If you choose to wash your hair, wash your hair first as usual with your  normal  shampoo.  3.  After you shampoo, rinse your hair and body thoroughly to remove the  shampoo.                             4.  Use CHG as you would any other liquid soap.  You can apply chg directly  to the skin and wash                       Gently with a scrungie or  clean washcloth.  5.  Apply the CHG Soap to your body ONLY FROM THE NECK DOWN.   Do not use on face/ open                           Wound or open sores. Avoid contact with eyes, ears mouth and genitals (private parts).                       Wash face,  Genitals (private parts) with your normal soap.             6.  Wash thoroughly, paying special attention to the area where your surgery  will be performed.  7.  Thoroughly rinse your body with warm water from the neck down.  8.  DO NOT shower/wash with your normal soap after using and rinsing off  the CHG Soap.                9.  Pat yourself dry with a clean towel.            10.  Wear clean pajamas.            11.  Place clean sheets on your bed the night of your first shower and do not  sleep with pets. Day of Surgery : Do not apply any lotions/deodorants the morning of surgery.  Please wear clean clothes to the hospital/surgery center.  FAILURE TO FOLLOW THESE INSTRUCTIONS MAY RESULT IN THE CANCELLATION OF YOUR SURGERY  PATIENT SIGNATURE_________________________________  NURSE SIGNATURE__________________________________  ________________________________________________________________________

## 2017-05-01 ENCOUNTER — Other Ambulatory Visit: Payer: Self-pay

## 2017-05-01 ENCOUNTER — Encounter (HOSPITAL_COMMUNITY)
Admission: RE | Admit: 2017-05-01 | Discharge: 2017-05-01 | Disposition: A | Discharge status: Home / Self Care | Payer: Medicare Other | Source: Ambulatory Visit | Attending: Urology | Admitting: Urology

## 2017-05-01 ENCOUNTER — Encounter (HOSPITAL_COMMUNITY): Payer: Self-pay

## 2017-05-01 DIAGNOSIS — M1990 Unspecified osteoarthritis, unspecified site: Secondary | ICD-10-CM | POA: Diagnosis not present

## 2017-05-01 DIAGNOSIS — Z79899 Other long term (current) drug therapy: Secondary | ICD-10-CM | POA: Diagnosis not present

## 2017-05-01 DIAGNOSIS — I4891 Unspecified atrial fibrillation: Secondary | ICD-10-CM | POA: Diagnosis not present

## 2017-05-01 DIAGNOSIS — E785 Hyperlipidemia, unspecified: Secondary | ICD-10-CM | POA: Diagnosis not present

## 2017-05-01 DIAGNOSIS — G603 Idiopathic progressive neuropathy: Secondary | ICD-10-CM | POA: Diagnosis not present

## 2017-05-01 DIAGNOSIS — C678 Malignant neoplasm of overlapping sites of bladder: Secondary | ICD-10-CM | POA: Diagnosis not present

## 2017-05-01 DIAGNOSIS — K219 Gastro-esophageal reflux disease without esophagitis: Secondary | ICD-10-CM | POA: Diagnosis not present

## 2017-05-01 DIAGNOSIS — I10 Essential (primary) hypertension: Secondary | ICD-10-CM | POA: Diagnosis not present

## 2017-05-01 DIAGNOSIS — E669 Obesity, unspecified: Secondary | ICD-10-CM | POA: Diagnosis not present

## 2017-05-01 DIAGNOSIS — Z6841 Body Mass Index (BMI) 40.0 and over, adult: Secondary | ICD-10-CM | POA: Diagnosis not present

## 2017-05-01 DIAGNOSIS — D494 Neoplasm of unspecified behavior of bladder: Secondary | ICD-10-CM | POA: Diagnosis present

## 2017-05-01 DIAGNOSIS — Z87891 Personal history of nicotine dependence: Secondary | ICD-10-CM | POA: Diagnosis not present

## 2017-05-01 DIAGNOSIS — J449 Chronic obstructive pulmonary disease, unspecified: Secondary | ICD-10-CM | POA: Diagnosis not present

## 2017-05-01 DIAGNOSIS — B391 Chronic pulmonary histoplasmosis capsulati: Secondary | ICD-10-CM | POA: Diagnosis not present

## 2017-05-01 LAB — CBC
HCT: 38 % — ABNORMAL LOW (ref 39.0–52.0)
HEMOGLOBIN: 12.6 g/dL — AB (ref 13.0–17.0)
MCH: 31.7 pg (ref 26.0–34.0)
MCHC: 33.2 g/dL (ref 30.0–36.0)
MCV: 95.5 fL (ref 78.0–100.0)
Platelets: 194 10*3/uL (ref 150–400)
RBC: 3.98 MIL/uL — AB (ref 4.22–5.81)
RDW: 14.8 % (ref 11.5–15.5)
WBC: 9.7 10*3/uL (ref 4.0–10.5)

## 2017-05-01 LAB — BASIC METABOLIC PANEL
Anion gap: 9 (ref 5–15)
BUN: 25 mg/dL — ABNORMAL HIGH (ref 6–20)
CALCIUM: 8.9 mg/dL (ref 8.9–10.3)
CO2: 23 mmol/L (ref 22–32)
CREATININE: 1.16 mg/dL (ref 0.61–1.24)
Chloride: 109 mmol/L (ref 101–111)
GFR calc Af Amer: >60 mL/min (ref >60)
GFR calc non Af Amer: 58 mL/min — ABNORMAL LOW (ref >60)
GLUCOSE: 92 mg/dL (ref 65–99)
Potassium: 4.7 mmol/L (ref 3.5–5.1)
Sodium: 141 mmol/L (ref 135–145)

## 2017-05-01 NOTE — Pre-Procedure Instructions (Signed)
Surgical clearance on chart. CBC and BMP 05/01/17 faxed to Dr. Karsten Ro via epic.

## 2017-05-03 NOTE — H&P (Signed)
HPI: John Stark is a 81 year-old male with a bladder tumor.  He first noticed the symptoms 12/20/2016. He did see the blood in his urine. He has not seen blood clots.   He does not have a burning sensation when he urinates. He is not currently having trouble urinating.   He has not had kidney stones. He is not having pain. He has not recently had unwanted weight loss.   This condition would be considered of mild to moderate severity with no modifying factors or associated signs or symptoms other than as noted above.   03/13/17: On 12/20/16 he experienced an episode of gross, painless hematuria was not associated with any change in his voiding pattern. He has not had any hematuria since that time.  He said when he experienced his hematuria occurred for 48 hours and has not returned. He had no discomfort. He does have some urgency and frequency when he takes his Lasix. He only gets up once at night. He has no history of stones. He did smoke about one-2 packs per day and quit in 1978. He has never been around Sports administrator.   03/27/17: He said he passed a little blood as recently as this morning. He has no other complaints.     ALLERGIES: None   MEDICATIONS: Celecoxib 200 mg capsule  Hydrocodone-Acetaminophen  Meclizine Hcl 25 mg tablet  Pepcid  Triiamcinolone  Xarelto 20 mg tablet     GU PSH: Locm 300-399Mg /Ml Iodine,1Ml - 03/21/2017    NON-GU PSH: Back surgery    GU PMH: Encounter for Prostate Cancer screening, Due to his gross hematuria I am also going to check a PSA. - 03/13/2017 Gross hematuria, We discussed the need for evaluation of his urinary tract. I am going to obtain a serum creatinine today to evaluate his renal function and then obtain a CT scan using hematuria protocol. He will then return for completion of his hematuria workup with cystoscopy. - 03/13/2017    NON-GU PMH: Arrhythmia Arthritis Asthma Atrial Fibrillation Chronic pulmonary histoplasmosis  capsulati Hypertension Idiopathic progressive neuropathy    FAMILY HISTORY: 2 daughters - Runs in Family   SOCIAL HISTORY: Marital Status: Married Preferred Language: English; Ethnicity: Not Hispanic Or Latino; Race: White Current Smoking Status: Patient does not smoke anymore.   Tobacco Use Assessment Completed: Used Tobacco in last 30 days? Has never drank.  Does not drink caffeine.    REVIEW OF SYSTEMS:    GU Review Male:   Patient reports frequent urination, get up at night to urinate, and erection problems. Patient denies hard to postpone urination, burning/ pain with urination, leakage of urine, stream starts and stops, trouble starting your stream, have to strain to urinate , and penile pain.  Gastrointestinal (Upper):   Patient denies nausea, vomiting, and indigestion/ heartburn.  Gastrointestinal (Lower):   Patient denies diarrhea and constipation.  Constitutional:   Patient denies fever, night sweats, weight loss, and fatigue.  Skin:   Patient reports skin rash/ lesion. Patient denies itching.  Eyes:   Patient denies blurred vision and double vision.  Ears/ Nose/ Throat:   Patient reports sinus problems. Patient denies sore throat.  Hematologic/Lymphatic:   Patient reports easy bruising. Patient denies swollen glands.  Cardiovascular:   Patient reports leg swelling. Patient denies chest pains.  Respiratory:   Patient denies cough and shortness of breath.  Endocrine:   Patient denies excessive thirst.  Musculoskeletal:   Patient reports back pain and joint pain.   Neurological:  Patient denies headaches and dizziness.  Psychologic:   Patient denies depression and anxiety.   VITAL SIGNS:      Weight 300 lb / 136.08 kg  Height 71 in / 180.34 cm  BP 147/100 mmHg  Pulse 79 /min  BMI 41.8 kg/m   GU PHYSICAL EXAMINATION:    Anus and Perineum: No hemorrhoids. No anal stenosis. No rectal fissure, no anal fissure. No edema, no dimple, no perineal tenderness, no anal  tenderness.  Scrotum: No lesions. No edema. No cysts. No warts.  Epididymides: Right: no spermatocele, no masses, no cysts, no tenderness, no induration, no enlargement. Left: no spermatocele, no masses, no cysts, no tenderness, no induration, no enlargement.  Testes: No tenderness, no swelling, no enlargement left testes. No tenderness, no swelling, no enlargement right testes. Normal location left testes. Normal location right testes. No mass, no cyst, no varicocele, no hydrocele left testes. No mass, no cyst, no varicocele, no hydrocele right testes.  Urethral Meatus: Normal size. No lesion, no wart, no discharge, no polyp. Normal location.  Penis: Circumcised, no warts, no cracks. No dorsal Peyronie's plaques, no left corporal Peyronie's plaques, no right corporal Peyronie's plaques, no scarring, no warts. No balanitis, no meatal stenosis.  Prostate: 40 gram or 2+ size. Left lobe normal consistency, right lobe normal consistency. Symmetrical lobes. No prostate nodule. Left lobe no tenderness, right lobe no tenderness. The prostate seemed enlarged in the midline with no median furrow palpable.  Seminal Vesicles: Nonpalpable.  Sphincter Tone: Normal sphincter. No rectal tenderness. No rectal mass.    MULTI-SYSTEM PHYSICAL EXAMINATION:    Constitutional: Well-nourished. No physical deformities. Normally developed. Good grooming.  Neck: Neck symmetrical, not swollen. Normal tracheal position.  Respiratory: No labored breathing, no use of accessory muscles.   Cardiovascular: Normal temperature, normal extremity pulses, no swelling, no varicosities.  Lymphatic: No enlargement of neck, axillae, groin.  Skin: No paleness, no jaundice, no cyanosis. No lesion, no ulcer, no rash.  Neurologic / Psychiatric: Oriented to time, oriented to place, oriented to person. No depression, no anxiety, no agitation.  Gastrointestinal: No mass, no tenderness, no rigidity, non obese abdomen.  Eyes: Normal conjunctivae.  Normal eyelids.  Ears, Nose, Mouth, and Throat: Left ear no scars, no lesions, no masses. Right ear no scars, no lesions, no masses. Nose no scars, no lesions, no masses. Normal hearing. Normal lips.  Musculoskeletal: Normal gait and station of head and neck.       PAST DATA REVIEWED:  Source Of History:  Patient  Lab Test Review:   PSA, BUN/Creatinine  Records Review:   Previous Patient Records, POC Tool  X-Ray Review: C.T. Hematuria: Reviewed Films. Reviewed Report. Discussed With Patient. EXAM: CT ABDOMEN AND PELVIS WITHOUT AND WITH CONTRAST TECHNIQUE: Multidetector CT imaging of the abdomen and pelvis was performed following the standard protocol before and following the bolus administration of intravenous contrast. CONTRAST: 125 cc of Isovue-300 COMPARISON: None. FINDINGS: Lower chest: Clear lung bases. Normal heart size without pericardial or pleural effusion. Right coronary artery atherosclerosis. A small to moderate hiatal hernia. Hepatobiliary: Normal liver. Normal gallbladder, without biliary ductal dilatation. Pancreas: Pancreatic atrophy, without duct dilatation or mass. Spleen: Normal in size, without focal abnormality. Adrenals/Urinary Tract: Normal adrenal glands. Right worse than left renal cortical thinning. No renal calculi or hydronephrosis. No hydroureter or ureteric calculi. No bladder calculi. Upper pole left renal 1.3 cm lesion is most consistent with a cyst or minimally complex cyst. Moderate renal collecting system opacification on delayed images. Portions  of the mid and distal left ureter are suboptimally opacified on delayed images, despite 2 attempts. Subtle focus of hyper enhancement is suspected within the right bladder trigone, on the order of 1.0 cm on image 73/series 5. This corresponds to a subtle 1.2 cm filling defect on image 57/ series 11. The bladder is incompletely distended with contrast on delayed images. The bladder enters a right inguinal hernia. Stomach/Bowel:  Normal remainder of the stomach. Scattered colonic diverticula. Normal terminal ileum and appendix. Normal small bowel. Vascular/Lymphatic: Aortic and branch vessel atherosclerosis. Infrarenal abdominal aortic aneurysm at 4.4 x 4.1 cm. No extension into the iliacs. No abdominopelvic adenopathy. Reproductive: Normal prostate. Other: Right larger than left fat containing inguinal hernias. Minimal extension of the bladder into the right-sided hernia. No significant free fluid. Musculoskeletal: Dorsal spinal stimulator. Prior lumbar laminectomies. Advanced lumbosacral spondylosis. IMPRESSION: 1. 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 2. No evidence of metastatic disease. 3. 4.4 cm abdominal aortic aneurysm. Recommend followup by ultrasound in 1 year. This recommendation follows ACR consensus guidelines: White Paper of the ACR Incidental Findings Committee II on Vascular Findings. J Am Coll Radiol 2013; 10:789-794. 4. Minimal extension of the urinary bladder into a right inguinal hernia. 5. Hiatal hernia. 6. Coronary artery atherosclerosis.    03/13/17  PSA  Total PSA 0.060 ng/dL    03/13/17  General Chemistry  Creatinine 1.3 mg/dL  Notes:                     He was found to have a normal PSA and creatinine.   PROCEDURES:         Flexible Cystoscopy - 52000  Risks, benefits, and some of the potential complications of the procedure were discussed at length with the patient including infection, bleeding, voiding discomfort, urinary retention, fever, chills, sepsis, and others. All questions were answered. Informed consent was obtained. Sterile technique and 2% Lidocaine intraurethral analgesia were used.  Meatus:  Normal size. Normal location. Normal condition.  Urethra:  No strictures.  External Sphincter:  Normal.  Verumontanum:  Normal.  Prostate:  Non-obstructing. No hyperplasia.  Bladder Neck:  Non-obstructing.  Ureteral Orifices:  Normal location. Normal  size. Normal shape. Effluxed clear urine.  Bladder:  Mild trabeculation. A right trigone tumor. It involves the right ureteral orifice. No stones.      The lower urinary tract was carefully examined. The procedure was well-tolerated and without complications. Instructions were given to call the office immediately for bloody urine, difficulty urinating, urinary retention, painful or frequent urination, fever or other illness. The patient stated that he understood these instructions and would comply with them.         Urinalysis w/Scope Dipstick Dipstick Cont'd Micro  Color: Yellow Bilirubin: Neg WBC/hpf: NS (Not Seen)  Appearance: Clear Ketones: Neg RBC/hpf: 20 - 40/hpf  Specific Gravity: 1.025 Blood: 3+ Bacteria: NS (Not Seen)  pH: 6.0 Protein: 1+ Cystals: NS (Not Seen)  Glucose: Neg Urobilinogen: 0.2 Casts: NS (Not Seen)    Nitrites: Neg Trichomonas: Not Present    Leukocyte Esterase: Neg Mucous: Present      Epithelial Cells: 0 - 5/hpf      Yeast: NS (Not Seen)      Sperm: Not Present    ASSESSMENT/PLAN:     ICD-10 Details  1 GU:   Gross hematuria - R31.0 Stable - His gross hematuria secondary to a bladder tumor noted cystoscopically today.  2   Bladder tumor/neoplasm - D41.4 He has  a papillary bladder tumor involving the right wall of the bladder as well as trigone and also involving his right ureteral orifice. We discussed the need to resect the tumor and the fact that I would be resecting some of his right ureteral orifice at the time of surgery.          Notes:   I went over the results of the CT scan as well as my cystoscopic findings today which have revealed a bladder mass consistent with transitional cell carcinoma. We discussed the fact that currently there is no evidence of extravesical extension or pelvic adenopathy based on CT scan findings. Further characterization of the lesion is required for grading and staging purposes. We discussed proceeding with evaluation using  transurethral resection of the lesion. I have discussed the procedure in detail as well as the potential risks and complications associated with this form of surgery. We also discussed the probability of successful resection of the intravesical portion of this lesion. I have recommended, as long as there is no contraindication at the time of surgery, the placement of intravesical mitomycin-C in order to reduce the risk of recurrence. We did discuss the potential side effects of this form of intravesical chemotherapy. The procedure will be performed under anesthesia as an outpatient.

## 2017-05-04 ENCOUNTER — Ambulatory Visit (HOSPITAL_COMMUNITY): Payer: Medicare Other | Admitting: Anesthesiology

## 2017-05-04 ENCOUNTER — Other Ambulatory Visit: Payer: Self-pay

## 2017-05-04 ENCOUNTER — Ambulatory Visit (HOSPITAL_COMMUNITY)
Admission: RE | Admit: 2017-05-04 | Discharge: 2017-05-04 | Disposition: A | Discharge status: Home / Self Care | Payer: Medicare Other | Source: Ambulatory Visit | Attending: Urology | Admitting: Urology

## 2017-05-04 ENCOUNTER — Encounter (HOSPITAL_COMMUNITY)
Admission: RE | Disposition: A | Discharge status: Home / Self Care | Payer: Self-pay | Source: Ambulatory Visit | Attending: Urology

## 2017-05-04 ENCOUNTER — Encounter (HOSPITAL_COMMUNITY): Payer: Self-pay | Admitting: *Deleted

## 2017-05-04 DIAGNOSIS — Z6841 Body Mass Index (BMI) 40.0 and over, adult: Secondary | ICD-10-CM | POA: Insufficient documentation

## 2017-05-04 DIAGNOSIS — B391 Chronic pulmonary histoplasmosis capsulati: Secondary | ICD-10-CM | POA: Insufficient documentation

## 2017-05-04 DIAGNOSIS — G603 Idiopathic progressive neuropathy: Secondary | ICD-10-CM | POA: Diagnosis not present

## 2017-05-04 DIAGNOSIS — Z79899 Other long term (current) drug therapy: Secondary | ICD-10-CM | POA: Insufficient documentation

## 2017-05-04 DIAGNOSIS — M1990 Unspecified osteoarthritis, unspecified site: Secondary | ICD-10-CM | POA: Insufficient documentation

## 2017-05-04 DIAGNOSIS — K219 Gastro-esophageal reflux disease without esophagitis: Secondary | ICD-10-CM | POA: Insufficient documentation

## 2017-05-04 DIAGNOSIS — I4891 Unspecified atrial fibrillation: Secondary | ICD-10-CM | POA: Insufficient documentation

## 2017-05-04 DIAGNOSIS — E669 Obesity, unspecified: Secondary | ICD-10-CM | POA: Insufficient documentation

## 2017-05-04 DIAGNOSIS — C678 Malignant neoplasm of overlapping sites of bladder: Secondary | ICD-10-CM | POA: Insufficient documentation

## 2017-05-04 DIAGNOSIS — I10 Essential (primary) hypertension: Secondary | ICD-10-CM | POA: Diagnosis not present

## 2017-05-04 DIAGNOSIS — E785 Hyperlipidemia, unspecified: Secondary | ICD-10-CM | POA: Insufficient documentation

## 2017-05-04 DIAGNOSIS — Z87891 Personal history of nicotine dependence: Secondary | ICD-10-CM | POA: Insufficient documentation

## 2017-05-04 DIAGNOSIS — J449 Chronic obstructive pulmonary disease, unspecified: Secondary | ICD-10-CM | POA: Insufficient documentation

## 2017-05-04 DIAGNOSIS — D494 Neoplasm of unspecified behavior of bladder: Secondary | ICD-10-CM

## 2017-05-04 HISTORY — PX: TRANSURETHRAL RESECTION OF BLADDER TUMOR: SHX2575

## 2017-05-04 SURGERY — TURBT (TRANSURETHRAL RESECTION OF BLADDER TUMOR)
Anesthesia: General

## 2017-05-04 MED ORDER — BELLADONNA-OPIUM 16.2-30 MG RE SUPP
RECTAL | Status: AC
  Filled 2017-05-04: qty 1

## 2017-05-04 MED ORDER — MIDAZOLAM HCL 5 MG/5ML IJ SOLN
INTRAMUSCULAR | Status: DC | PRN
  Administered 2017-05-04 (×2): 1 mg via INTRAVENOUS

## 2017-05-04 MED ORDER — PROPOFOL 10 MG/ML IV BOLUS
INTRAVENOUS | Status: DC | PRN
  Administered 2017-05-04: 170 mg via INTRAVENOUS

## 2017-05-04 MED ORDER — MEPERIDINE HCL 50 MG/ML IJ SOLN: 6.2500 mg | INTRAMUSCULAR | Status: DC | PRN

## 2017-05-04 MED ORDER — BELLADONNA ALKALOIDS-OPIUM 16.2-60 MG RE SUPP: 1.0000 | Freq: Once | RECTAL | Status: DC

## 2017-05-04 MED ORDER — CIPROFLOXACIN IN D5W 400 MG/200ML IV SOLN
400.0000 mg | INTRAVENOUS | Status: AC
  Administered 2017-05-04: 400 mg via INTRAVENOUS
  Filled 2017-05-04: qty 200

## 2017-05-04 MED ORDER — ONDANSETRON HCL 4 MG/2ML IJ SOLN
INTRAMUSCULAR | Status: AC
  Filled 2017-05-04: qty 2

## 2017-05-04 MED ORDER — PROPOFOL 10 MG/ML IV BOLUS
INTRAVENOUS | Status: AC
  Filled 2017-05-04: qty 20

## 2017-05-04 MED ORDER — FENTANYL CITRATE (PF) 100 MCG/2ML IJ SOLN
INTRAMUSCULAR | Status: AC
  Filled 2017-05-04: qty 2

## 2017-05-04 MED ORDER — PROMETHAZINE HCL 25 MG/ML IJ SOLN: 6.2500 mg | INTRAMUSCULAR | Status: DC | PRN

## 2017-05-04 MED ORDER — DEXAMETHASONE SODIUM PHOSPHATE 10 MG/ML IJ SOLN
INTRAMUSCULAR | Status: AC
  Filled 2017-05-04: qty 1

## 2017-05-04 MED ORDER — LIDOCAINE HCL 1 % IJ SOLN
INTRAMUSCULAR | Status: DC | PRN
  Administered 2017-05-04: 50 mg via INTRADERMAL

## 2017-05-04 MED ORDER — SODIUM CHLORIDE 0.9 % IR SOLN
Status: DC | PRN
  Administered 2017-05-04: 6000 mL

## 2017-05-04 MED ORDER — STERILE WATER FOR IRRIGATION IR SOLN
Status: DC | PRN
  Administered 2017-05-04: 500 mL

## 2017-05-04 MED ORDER — PHENAZOPYRIDINE HCL 200 MG PO TABS: 200.0000 mg | ORAL_TABLET | Freq: Three times a day (TID) | ORAL | 0 refills | Status: DC | PRN

## 2017-05-04 MED ORDER — MIDAZOLAM HCL 2 MG/2ML IJ SOLN
INTRAMUSCULAR | Status: AC
  Filled 2017-05-04: qty 2

## 2017-05-04 MED ORDER — ONDANSETRON HCL 4 MG/2ML IJ SOLN
INTRAMUSCULAR | Status: DC | PRN
  Administered 2017-05-04: 4 mg via INTRAVENOUS

## 2017-05-04 MED ORDER — PHENAZOPYRIDINE HCL 200 MG PO TABS
200.0000 mg | ORAL_TABLET | Freq: Once | ORAL | Status: AC
  Administered 2017-05-04: 200 mg via ORAL
  Filled 2017-05-04: qty 1

## 2017-05-04 MED ORDER — OXYCODONE-ACETAMINOPHEN 5-325 MG PO TABS: 1.0000 | ORAL_TABLET | ORAL | 0 refills | Status: DC | PRN

## 2017-05-04 MED ORDER — FENTANYL CITRATE (PF) 100 MCG/2ML IJ SOLN
25.0000 ug | INTRAMUSCULAR | Status: DC | PRN
  Administered 2017-05-04 (×2): 50 ug via INTRAVENOUS

## 2017-05-04 MED ORDER — 0.9 % SODIUM CHLORIDE (POUR BTL) OPTIME
TOPICAL | Status: DC | PRN
  Administered 2017-05-04: 1000 mL

## 2017-05-04 MED ORDER — FENTANYL CITRATE (PF) 100 MCG/2ML IJ SOLN
INTRAMUSCULAR | Status: DC | PRN
  Administered 2017-05-04 (×2): 50 ug via INTRAVENOUS

## 2017-05-04 MED ORDER — LACTATED RINGERS IV SOLN
INTRAVENOUS | Status: DC
  Administered 2017-05-04: 10:00:00 via INTRAVENOUS

## 2017-05-04 MED ORDER — ALBUTEROL SULFATE (2.5 MG/3ML) 0.083% IN NEBU
2.5000 mg | INHALATION_SOLUTION | Freq: Once | RESPIRATORY_TRACT | Status: AC
  Administered 2017-05-04: 2.5 mg via RESPIRATORY_TRACT

## 2017-05-04 MED ORDER — BELLADONNA-OPIUM 16.2-30 MG RE SUPP
30.0000 mg | Freq: Once | RECTAL | Status: AC
  Administered 2017-05-04: 1 via RECTAL

## 2017-05-04 MED ORDER — ALBUTEROL SULFATE (2.5 MG/3ML) 0.083% IN NEBU
INHALATION_SOLUTION | RESPIRATORY_TRACT | Status: AC
  Administered 2017-05-04: 2.5 mg via RESPIRATORY_TRACT
  Filled 2017-05-04: qty 3

## 2017-05-04 MED ORDER — MITOMYCIN CHEMO FOR BLADDER INSTILLATION 40 MG
40.0000 mg | Freq: Once | INTRAVENOUS | Status: AC
  Administered 2017-05-04: 40 mg via INTRAVESICAL
  Filled 2017-05-04: qty 40

## 2017-05-04 MED ORDER — LIDOCAINE 2% (20 MG/ML) 5 ML SYRINGE
INTRAMUSCULAR | Status: AC
  Filled 2017-05-04: qty 5

## 2017-05-04 MED ORDER — OXYCODONE-ACETAMINOPHEN 5-325 MG PO TABS
1.0000 | ORAL_TABLET | ORAL | Status: DC | PRN
  Administered 2017-05-04: 1 via ORAL
  Filled 2017-05-04: qty 1

## 2017-05-04 SURGICAL SUPPLY — 17 items
BAG URINE DRAINAGE (UROLOGICAL SUPPLIES) IMPLANT
BAG URO CATCHER STRL LF (MISCELLANEOUS) ×2 IMPLANT
CATH FOLEY 2WAY 5CC 20FR (CATHETERS) ×2 IMPLANT
COVER FOOTSWITCH UNIV (MISCELLANEOUS) IMPLANT
COVER SURGICAL LIGHT HANDLE (MISCELLANEOUS) ×2 IMPLANT
ELECT REM PT RETURN 15FT ADLT (MISCELLANEOUS) ×2 IMPLANT
EVACUATOR MICROVAS BLADDER (UROLOGICAL SUPPLIES) ×2 IMPLANT
GLOVE BIOGEL M 8.0 STRL (GLOVE) ×2 IMPLANT
GOWN STRL REUS W/TWL XL LVL3 (GOWN DISPOSABLE) ×2 IMPLANT
HOLDER FOLEY CATH W/STRAP (MISCELLANEOUS) ×2 IMPLANT
LOOP CUT BIPOLAR 24F LRG (ELECTROSURGICAL) IMPLANT
MANIFOLD NEPTUNE II (INSTRUMENTS) ×2 IMPLANT
NS IRRIG 1000ML POUR BTL (IV SOLUTION) ×2 IMPLANT
PACK CYSTO (CUSTOM PROCEDURE TRAY) ×2 IMPLANT
SET ASPIRATION TUBING (TUBING) ×2 IMPLANT
SYRINGE IRR TOOMEY STRL 70CC (SYRINGE) ×2 IMPLANT
TUBING CONNECTING 10 (TUBING) ×2 IMPLANT

## 2017-05-04 NOTE — Anesthesia Procedure Notes (Signed)
Procedure Name: LMA Insertion Date/Time: 05/04/2017 12:16 PM Performed by: Garrel Ridgel, CRNA Pre-anesthesia Checklist: Emergency Drugs available, Patient identified, Suction available, Patient being monitored and Timeout performed Patient Re-evaluated:Patient Re-evaluated prior to induction Oxygen Delivery Method: Circle system utilized Preoxygenation: Pre-oxygenation with 100% oxygen Induction Type: IV induction Ventilation: Mask ventilation without difficulty LMA: LMA inserted LMA Size: 4.0 Number of attempts: 1 Placement Confirmation: positive ETCO2 Tube secured with: Tape Dental Injury: Teeth and Oropharynx as per pre-operative assessment

## 2017-05-04 NOTE — Discharge Instructions (Signed)
Transurethral Resection of Bladder Tumor (TURBT)   Definition:  Transurethral Resection of the Bladder Tumor is a surgical procedure used to diagnose and remove tumors within the bladder. TURBT is the most common treatment for early stage bladder cancer.  General instructions:     Your recent bladder surgery requires very little post hospital care but some definite precautions.  Despite the fact that no skin incisions were used, the area around the bladder incisions are raw and covered with scabs to promote healing and prevent bleeding. Certain precautions are needed to insure that the scabs are not disturbed over the next 2-4 weeks while the healing proceeds.  Because the raw surface inside your bladder and the irritating effects of urine you may expect frequency of urination and/or urgency (a stronger desire to urinate) and perhaps even getting up at night more often. This will usually resolve or improve slowly over the healing period. You may see some blood in your urine over the first 6 weeks. Do not be alarmed, even if the urine was clear for a while. Get off your feet and drink lots of fluids until clearing occurs. If you start to pass clots or don't improve call us.  Catheter: (If you are discharged with a catheter.)  1. Keep your catheter secured to your leg at all times with tape or the supplied strap. 2. You may experience leakage of urine around your catheter- as long as the  catheter continues to drain, this is normal.  If your catheter stops draining  go to the ER. 3. You may also have blood in your urine, even after it has been clear for  several days; you may even pass some small blood clots or other material.  This  is normal as well.  If this happens, sit down and drink plenty of water to help  make urine to flush out your bladder.  If the blood in your urine becomes worse  after doing this, contact our office or return to the ER. 4. You may use the leg bag (small bag)  during the day, but use the large bag at  night.  Diet:  You may return to your normal diet immediately. Because of the raw surface of your bladder, alcohol, spicy foods, foods high in acid and drinks with caffeine may cause irritation or frequency and should be used in moderation. To keep your urine flowing freely and avoid constipation, drink plenty of fluids during the day (8-10 glasses). Tip: Avoid cranberry juice because it is very acidic.  Activity:  Your physical activity doesn't need to be restricted. However, if you are very active, you may see some blood in the urine. We suggest that you reduce your activity under the circumstances until the bleeding has stopped.  Bowels:  It is important to keep your bowels regular during the postoperative period. Straining with bowel movements can cause bleeding. A bowel movement every other day is reasonable. Use a mild laxative if needed, such as milk of magnesia 2-3 tablespoons, or 2 Dulcolax tablets. Call if you continue to have problems. If you had been taking narcotics for pain, before, during or after your surgery, you may be constipated. Take a laxative if necessary.    Medication:  You should resume your pre-surgery medications unless told not to. In addition you may be given an antibiotic to prevent or treat infection. Antibiotics are not always necessary. All medication should be taken as prescribed until the bottles are finished unless you are having  an unusual reaction to one of the drugs.     General Anesthesia, Adult, Care After These instructions provide you with information about caring for yourself after your procedure. Your health care provider may also give you more specific instructions. Your treatment has been planned according to current medical practices, but problems sometimes occur. Call your health care provider if you have any problems or questions after your procedure. What can I expect after the procedure? After  the procedure, it is common to have:  Vomiting.  A sore throat.  Mental slowness.  It is common to feel:  Nauseous.  Cold or shivery.  Sleepy.  Tired.  Sore or achy, even in parts of your body where you did not have surgery.  Follow these instructions at home: For at least 24 hours after the procedure:  Do not: ? Participate in activities where you could fall or become injured. ? Drive. ? Use heavy machinery. ? Drink alcohol. ? Take sleeping pills or medicines that cause drowsiness. ? Make important decisions or sign legal documents. ? Take care of children on your own.  Rest. Eating and drinking  If you vomit, drink water, juice, or soup when you can drink without vomiting.  Drink enough fluid to keep your urine clear or pale yellow.  Make sure you have little or no nausea before eating solid foods.  Follow the diet recommended by your health care provider. General instructions  Have a responsible adult stay with you until you are awake and alert.  Return to your normal activities as told by your health care provider. Ask your health care provider what activities are safe for you.  Take over-the-counter and prescription medicines only as told by your health care provider.  If you smoke, do not smoke without supervision.  Keep all follow-up visits as told by your health care provider. This is important. Contact a health care provider if:  You continue to have nausea or vomiting at home, and medicines are not helpful.  You cannot drink fluids or start eating again.  You cannot urinate after 8-12 hours.  You develop a skin rash.  You have fever.  You have increasing redness at the site of your procedure. Get help right away if:  You have difficulty breathing.  You have chest pain.  You have unexpected bleeding.  You feel that you are having a life-threatening or urgent problem. This information is not intended to replace advice given to you by  your health care provider. Make sure you discuss any questions you have with your health care provider. Document Released: 09/18/2000 Document Revised: 11/15/2015 Document Reviewed: 05/27/2015 Elsevier Interactive Patient Education  [employer].

## 2017-05-04 NOTE — Anesthesia Preprocedure Evaluation (Signed)
Anesthesia Evaluation  Patient identified by MRN, date of birth, ID band Patient awake    Reviewed: Allergy & Precautions, NPO status , Patient's Chart, lab work & pertinent test results  History of Anesthesia Complications Negative for: history of anesthetic complications  Airway Mallampati: III  TM Distance: >3 FB Neck ROM: Full    Dental  (+) Poor Dentition, Chipped   Pulmonary shortness of breath, asthma , COPD,  COPD inhaler, former smoker,    breath sounds clear to auscultation- rhonchi (-) wheezing      Cardiovascular hypertension, Pt. on medications (-) CAD and (-) Past MI + dysrhythmias Atrial Fibrillation  Rhythm:Regular Rate:Normal - Systolic murmurs and - Diastolic murmurs    Neuro/Psych negative neurological ROS  negative psych ROS   GI/Hepatic Neg liver ROS, GERD  ,  Endo/Other  negative endocrine ROSneg diabetes  Renal/GU negative Renal ROS     Musculoskeletal  (+) Arthritis ,   Abdominal (+) + obese,   Peds  Hematology negative hematology ROS (+)   Anesthesia Other Findings Past Medical History: No date: Asthma     Comment: "as a child" No date: Atrial fibrillation ([place])     Comment: a. Echo 05/27/12:  Mild LVH, EF 55%, aortic               sclerosis, no AS, mild LAE, mild RVE, moderate               RV dysfunction, mild RAE, PASP 32;  b. Xarelto               started 05/22/12 No date: COPD (chronic obstructive pulmonary disease) (* No date: Dysrhythmia     Comment: a fib No date: Edema     Comment: FEET/LEGS No date: Enlarged prostate No date: GERD (gastroesophageal reflux disease) No date: Hearing loss in right ear No date: HLD (hyperlipidemia) No date: HTN (hypertension) No date: Neuropathy (HCC) No date: Osteoarthrosis, unspecified whether generalize* No date: Overweight(278.02) No date: Right knee pain     Comment: awaiting knee replacement No date: Shortness of breath dyspnea No  date: Wheezing   Reproductive/Obstetrics                            Anesthesia Physical  Anesthesia Plan  ASA: III  Anesthesia Plan: General   Post-op Pain Management:    Induction: Intravenous  PONV Risk Score and Plan: 3 and Ondansetron, Dexamethasone and Treatment may vary due to age or medical condition  Airway Management Planned: LMA  Additional Equipment:   Intra-op Plan:   Post-operative Plan: Extubation in OR  Informed Consent: I have reviewed the patients History and Physical, chart, labs and discussed the procedure including the risks, benefits and alternatives for the proposed anesthesia with the patient or authorized representative who has indicated his/her understanding and acceptance.   Dental advisory given  Plan Discussed with: CRNA  Anesthesia Plan Comments:         Anesthesia Quick Evaluation

## 2017-05-04 NOTE — Anesthesia Postprocedure Evaluation (Signed)
Anesthesia Post Note  Patient: John Stark  Procedure(s) Performed: TRANSURETHRAL RESECTION OF BLADDER TUMOR (TURBT)/ INTRAVESICAL CHEMOTHERAPY INSTILLATION (N/A )     Patient location during evaluation: PACU Anesthesia Type: General Level of consciousness: sedated and patient cooperative Pain management: pain level controlled Vital Signs Assessment: post-procedure vital signs reviewed and stable Respiratory status: spontaneous breathing Cardiovascular status: stable Anesthetic complications: no    Last Vitals:  Vitals:   05/04/17 1421 05/04/17 1541  BP: (!) 149/92 (!) 142/80  Pulse: 84 72  Resp: 16 18  Temp: (!) 36.4 C 36.7 C  SpO2: 96% 98%    Last Pain:  Vitals:   05/04/17 1541  TempSrc: Oral  PainSc: [place]

## 2017-05-04 NOTE — Progress Notes (Signed)
Mitomycin removed from bladder, foley d'c'd per order.

## 2017-05-04 NOTE — Transfer of Care (Signed)
Immediate Anesthesia Transfer of Care Note  Patient: John Stark  Procedure(s) Performed: TRANSURETHRAL RESECTION OF BLADDER TUMOR (TURBT)/ INTRAVESICAL CHEMOTHERAPY INSTILLATION (N/A )  Patient Location: PACU  Anesthesia Type:General  Level of Consciousness: awake, alert  and oriented  Airway & Oxygen Therapy: Patient Spontanous Breathing and Patient connected to face mask oxygen  Post-op Assessment: Report given to RN, Post -op Vital signs reviewed and stable and Patient moving all extremities  Post vital signs: Reviewed and stable  Last Vitals:  Vitals:   05/04/17 1014  BP: (!) 153/97  Pulse: 72  Resp: (!) 22  Temp: 36.9 C  SpO2: 97%    Last Pain:  Vitals:   05/04/17 1014  TempSrc: Oral      Patients Stated Pain Goal: 5 (82/57/49 [id number])  Complications: No apparent anesthesia complications

## 2017-05-04 NOTE — Op Note (Signed)
PATIENT:  John Stark  PRE-OPERATIVE DIAGNOSIS: Bladder tumor  POST-OPERATIVE DIAGNOSIS: Same  PROCEDURE:  Procedure(s): 1. TRANSURETHRAL RESECTION OF BLADDER TUMOR (TURBT) (2.4cm.) 2. Instillation of intravesical chemotherapy (mitomycin-C)  SURGEON:  Surgeon(s): Claybon Jabs  ANESTHESIA:   General  EBL:  Minimal  DRAINS: Urethral catheter (20 Fr. Foley)   SPECIMEN:  Bladder tumor  DISPOSITION OF SPECIMEN:  PATHOLOGY  Indication: John Stark is a 81 year old male who experienced hematuria.  A CT scan revealed normal upper tracts and no evidence of extra vesicle disease.  Cystoscopically he was found to have a bladder tumor involving the right ureteral orifice and extending somewhat onto the right wall of the bladder lateral to the right UO.  We discussed the options for management and he is brought to the operating room today for transurethral resection of his bladder tumor and postoperative intravesical chemotherapy instillation.  Description of operation: The patient was taken to the operating room and administered general anesthesia. They were then placed on the table and moved to the dorsal lithotomy position after which the genitalia was sterilely prepped and draped. An official timeout was then performed.  The 63 French resectoscope with the 30 lens and visual obturator were then passed into the bladder under direct visualization. Urethra appeared normal. The visual obturator was then removed and the Gyrus resectoscope element with 30  lens was then inserted and the bladder was fully and systematically inspected. Ureteral orifices were noted to be in the normal anatomic positions.  The bladder tumor was noted to be on the floor the bladder primarily posterior to the right ureteral orifice and also appeared to partially involve the circumference around the UO.  There is a second portion of the tumor extending up onto the right wall of the bladder as well.  No other tumors  were identified within the bladder.  I first began by resecting the tumor from an area posterior to the right ureteral orifice and then had to completely resect away the superficial portion of the right ureteral orifice.  In doing this it was performed with pure cut.  Reinspection revealed a widely patent ureteral orifice.  There was no bleeding anywhere near this and therefore  fulguration was not necessary anywhere near this location.  The remainder of the bladder tumor on the right wall was then resected and after all tumor was resected bleeding points were cauterized and the surrounding mucosa was fulgurated.  I then used the Microvasive evacuator to remove all of the portions of resected bladder tumor and these were sent to pathology.  Reinspection revealed no perforation and right ureteral orifice effluxing clear urine. I then removed the resectoscope.  A 20 French Foley catheter was then inserted in the bladder and irrigated. The irrigant returned slightly pink with no clots. The patient was awakened and taken to the recovery room.  While in the recovery room 40 mg of mitomycin-C in 40 cc of water were instilled in the bladder through the catheter and the catheter was plugged. This will remain indwelling for approximately one hour. It will then be drained from the bladder and the catheter will be removed and the patient discharged home.  PLAN OF CARE: Discharge to home after PACU  PATIENT DISPOSITION:  PACU - hemodynamically stable.

## 2017-05-04 NOTE — Progress Notes (Signed)
Mitomycin placed in to bladder by MD and will remain in bladder x 1 hour.  Pt may return to [facility] for discharge after catheter removal. - per Dr Karsten Ro.

## 2017-05-27 ENCOUNTER — Encounter (HOSPITAL_COMMUNITY): Payer: Self-pay | Admitting: Emergency Medicine

## 2017-05-27 ENCOUNTER — Other Ambulatory Visit: Payer: Self-pay

## 2017-05-27 ENCOUNTER — Emergency Department (HOSPITAL_COMMUNITY): Payer: Medicare Other

## 2017-05-27 ENCOUNTER — Emergency Department (HOSPITAL_COMMUNITY)
Admission: EM | Admit: 2017-05-27 | Discharge: 2017-05-27 | Disposition: A | Discharge status: Home / Self Care | Payer: Medicare Other | Attending: Emergency Medicine | Admitting: Emergency Medicine

## 2017-05-27 DIAGNOSIS — I5032 Chronic diastolic (congestive) heart failure: Secondary | ICD-10-CM | POA: Diagnosis not present

## 2017-05-27 DIAGNOSIS — Z96651 Presence of right artificial knee joint: Secondary | ICD-10-CM | POA: Insufficient documentation

## 2017-05-27 DIAGNOSIS — Z7901 Long term (current) use of anticoagulants: Secondary | ICD-10-CM | POA: Diagnosis not present

## 2017-05-27 DIAGNOSIS — G8929 Other chronic pain: Secondary | ICD-10-CM | POA: Diagnosis not present

## 2017-05-27 DIAGNOSIS — J45909 Unspecified asthma, uncomplicated: Secondary | ICD-10-CM | POA: Diagnosis not present

## 2017-05-27 DIAGNOSIS — Z87891 Personal history of nicotine dependence: Secondary | ICD-10-CM | POA: Diagnosis not present

## 2017-05-27 DIAGNOSIS — I10 Essential (primary) hypertension: Secondary | ICD-10-CM | POA: Insufficient documentation

## 2017-05-27 DIAGNOSIS — M25511 Pain in right shoulder: Secondary | ICD-10-CM | POA: Diagnosis not present

## 2017-05-27 DIAGNOSIS — J449 Chronic obstructive pulmonary disease, unspecified: Secondary | ICD-10-CM | POA: Diagnosis not present

## 2017-05-27 DIAGNOSIS — I110 Hypertensive heart disease with heart failure: Secondary | ICD-10-CM | POA: Diagnosis not present

## 2017-05-27 DIAGNOSIS — Z79899 Other long term (current) drug therapy: Secondary | ICD-10-CM | POA: Insufficient documentation

## 2017-05-27 MED ORDER — MORPHINE SULFATE (PF) 4 MG/ML IV SOLN
4.0000 mg | Freq: Once | INTRAVENOUS | Status: AC
  Administered 2017-05-27: 4 mg via INTRAVENOUS
  Filled 2017-05-27: qty 1

## 2017-05-27 MED ORDER — FENTANYL CITRATE (PF) 100 MCG/2ML IJ SOLN
50.0000 ug | INTRAMUSCULAR | Status: DC | PRN
  Administered 2017-05-27: 50 ug via INTRAVENOUS
  Filled 2017-05-27: qty 2

## 2017-05-27 MED ORDER — OXYCODONE-ACETAMINOPHEN 5-325 MG PO TABS: 1.0000 | ORAL_TABLET | Freq: Four times a day (QID) | ORAL | 0 refills | Status: DC | PRN

## 2017-05-27 NOTE — ED Triage Notes (Addendum)
Reports having pain in right shoulder for a couple of weeks.  Went to pull bed covers down tonight and started having severe pain.  Took celebrex and oxycodone 5mg  around 3.

## 2017-05-27 NOTE — Discharge Instructions (Signed)
Return for any problem.  ?

## 2017-05-27 NOTE — ED Notes (Signed)
Radiology at bedside

## 2017-05-27 NOTE — ED Provider Notes (Signed)
[facility] EMERGENCY DEPARTMENT Provider Note   CSN: [id number] Arrival date & time: 05/27/17  0500     History   Chief Complaint Chief Complaint  Patient presents with  . Shoulder Pain    HPI John Stark is a 81 y.o. male.  81 year old male with a history of A. fib, COPD, hypertension, hyperlipidemia, neuropathy, and osteoarthritis who presents with complaint of right shoulder pain.  Patient reports intermittent right shoulder pain for the last several weeks.  He reports that last night he was moving his bed sheets and he felt sudden onset of pain to the right shoulder.  He took home pain medication without significant relief.  Denies any other specific injury.  He denies specific injury to the right shoulder itself.  He reports increased pain with movement of the right shoulder.  He denies numbness or tingling of the right upper extremity.  He denies associated chest pain, shortness of breath, or other complaint.   The history is provided by the patient.  Shoulder Pain   This is a chronic problem. The current episode started more than 1 week ago. The problem occurs daily. The problem has not changed since onset.The pain is present in the right shoulder. The quality of the pain is described as aching. The pain is moderate. Associated symptoms include limited range of motion. The symptoms are aggravated by activity. He has tried OTC pain medications for the symptoms. The treatment provided mild relief. There has been no history of extremity trauma.    Past Medical History:  Diagnosis Date  . Asthma    "as a child"  . Atrial fibrillation ([place])    a. Echo 05/27/12:  Mild LVH, EF 55%, aortic sclerosis, no AS, mild LAE, mild RVE, moderate RV dysfunction, mild RAE, PASP 32;  b. Xarelto started 05/22/12  . COPD (chronic obstructive pulmonary disease) ([place])   . Dysrhythmia    a fib  . Edema    FEET/LEGS  . Enlarged prostate   . GERD (gastroesophageal reflux  disease)   . Hearing loss in right ear   . HLD (hyperlipidemia)   . HTN (hypertension)   . Neuropathy   . Osteoarthrosis, unspecified whether generalized or localized, unspecified site   . Overweight(278.02)   . Right knee pain    awaiting knee replacement  . Shortness of breath dyspnea   . Wheezing     Patient Active Problem List   Diagnosis Date Noted  . Osteoarthritis of right knee 08/07/2012  . Atrial fibrillation ([place]) 06/05/2012  . DIASTOLIC HEART FAILURE, CHRONIC 06/15/2009  . HYPERLIPIDEMIA-MIXED 12/04/2008  . OVERWEIGHT/OBESITY 12/04/2008  . HYPERTENSION, UNSPECIFIED 12/04/2008  . GERD 12/04/2008  . OSTEOARTHRITIS 12/04/2008  . PALPITATIONS 12/04/2008    Past Surgical History:  Procedure Laterality Date  . BACK SURGERY     2011  . CARDIOVERSION  06/18/2012   Procedure: CARDIOVERSION;  Surgeon: Josue Hector, MD;  Location: [facility] ENDOSCOPY;  Service: Cardiovascular;  Laterality: N/A;  . CATARACT EXTRACTION W/PHACO Right 09/07/2016   Procedure: CATARACT EXTRACTION PHACO AND INTRAOCULAR LENS PLACEMENT ([place]);  Surgeon: Eulogio Bear, MD;  Location: [facility] ORS;  Service: Ophthalmology;  Laterality: Right;  [place] 42.9 AP% 10.0 CDE 4.52 Fluid pack lot # [id number]  . CATARACT EXTRACTION W/PHACO Left 10/12/2016   Procedure: CATARACT EXTRACTION PHACO AND INTRAOCULAR LENS PLACEMENT (IOC);  Surgeon: Eulogio Bear, MD;  Location: [facility] ORS;  Service: Ophthalmology;  Laterality: Left;  [place] 34.2 AP% 9.0 CDE 3.17 Fluid  pack lot # [id number]  . COLONOSCOPY    . JOINT REPLACEMENT    . SPINAL CORD STIMULATOR INSERTION N/A 02/18/2016   Procedure: LUMBAR SPINAL CORD STIMULATOR INSERTION;  Surgeon: Clydell Hakim, MD;  Location: [facility] NEURO ORS;  Service: Neurosurgery;  Laterality: N/A;  LUMBAR SPINAL CORD STIMULATOR INSERTION  . TOTAL KNEE ARTHROPLASTY Right 08/07/2012   Procedure: TOTAL KNEE ARTHROPLASTY;  Surgeon: Kerin Salen, MD;  Location: [facility];  Service: Orthopedics;  Laterality: Right;   . TRANSURETHRAL RESECTION OF BLADDER TUMOR N/A 05/04/2017   Procedure: TRANSURETHRAL RESECTION OF BLADDER TUMOR (TURBT)/ INTRAVESICAL CHEMOTHERAPY INSTILLATION;  Surgeon: Kathie Rhodes, MD;  Location: [facility] ORS;  Service: Urology;  Laterality: N/A;       Home Medications    Prior to Admission medications   Medication Sig Start Date End Date Taking? Authorizing Provider  amLODipine (NORVASC) 5 MG tablet Take 5 mg by mouth daily.    Yes [provider]  benazepril (LOTENSIN) 10 MG tablet Take 10 mg by mouth at bedtime.    Yes [provider]  celecoxib (CELEBREX) 200 MG capsule Take 200 mg by mouth daily with supper.    Yes [provider]  doxazosin (CARDURA) 4 MG tablet Take 4 mg by mouth at bedtime.    Yes [provider]  famotidine (PEPCID) 20 MG tablet TAKE 1 TABLET BY MOUTH ONCE DAILY Patient taking differently: TAKE 20mg  BY MOUTH with the evening meal. 03/30/17  Yes Burnell Blanks, MD  finasteride (PROSCAR) 5 MG tablet Take 5 mg by mouth at bedtime.    Yes [provider]  furosemide (LASIX) 40 MG tablet Take 40 mg by mouth daily.   Yes [provider]  metoprolol (LOPRESSOR) 50 MG tablet Take 1 tablet (50 mg total) by mouth 2 (two) times daily. 09/22/16  Yes Burnell Blanks, MD  Nutritional Supplements (JUICE PLUS FIBRE PO) Take 6 tablets by mouth daily. 2 nuts tablets, 2 fruit tablets, and 2 veggie tablets   Yes [provider]  oxyCODONE-acetaminophen (PERCOCET/ROXICET) 5-325 MG tablet Take 1 tablet every 4 (four) hours as needed by mouth for severe pain. 05/04/17  Yes Kathie Rhodes, MD  potassium chloride (MICRO-K) 10 MEQ CR capsule Take 10 mEq by mouth every other day.    Yes [provider]  PROAIR HFA 108 (90 Base) MCG/ACT inhaler Inhale 2 puffs into the lungs every 4 (four) hours as needed for shortness of breath or wheezing.  04/20/16  Yes [provider]  WELCHOL 625 MG tablet Take  1,250 mg by mouth 2 (two) times daily with a meal.  07/16/13  Yes [provider]  XARELTO 20 MG TABS tablet TAKE 1 TABLET BY MOUTH ONCE A DAY Patient taking differently: TAKE 20mg  BY MOUTH ONCE A DAY WITH SUPPER 06/30/16  Yes Burnell Blanks, MD  HYDROcodone-acetaminophen (NORCO/VICODIN) 5-325 MG tablet Take 1-2 tablets by mouth every 4 (four) hours as needed for severe pain. For pain Patient not taking: Reported on 05/27/2017 02/18/16   Clydell Hakim, MD  phenazopyridine (PYRIDIUM) 200 MG tablet Take 1 tablet (200 mg total) 3 (three) times daily as needed by mouth for pain. Patient not taking: Reported on 05/27/2017 05/04/17   Kathie Rhodes, MD    Family History Family History  Problem Relation Age of Onset  . Cancer Other   . Heart attack Brother 35    Social History Social History   Tobacco Use  . Smoking status: Former Smoker  Packs/day: 1.00    Years: 20.00    Pack years: 20.00    Types: Cigarettes    Last attempt to quit: 05/22/1977    Years since quitting: 40.0  . Smokeless tobacco: Never Used  . Tobacco comment: Quit 1978  Substance Use Topics  . Alcohol use: No  . Drug use: No     Allergies   Patient has no known allergies.   Review of Systems Review of Systems  Musculoskeletal:       Right shoulder pain  All other systems reviewed and are negative.    Physical Exam Updated Vital Signs BP (!) 160/98   Pulse 72   Temp (!) 97.2 F (36.2 C) (Oral)   Resp 18   Ht 5\' 11"  (1.803 m)   Wt 136.1 kg (300 lb)   SpO2 97%   BMI 41.84 kg/m   Physical Exam  Constitutional: He appears well-developed and well-nourished.  HENT:  Head: Normocephalic and atraumatic.  Mouth/Throat: Oropharynx is clear and moist.  Eyes: Conjunctivae and EOM are normal. Pupils are equal, round, and reactive to light.  Neck: Normal range of motion. Neck supple.  Cardiovascular: Normal rate, regular rhythm and normal heart sounds.  No murmur heard. Pulmonary/Chest:  Effort normal and breath sounds normal. No respiratory distress.  Abdominal: Soft. He exhibits no distension. There is no tenderness.  Musculoskeletal: He exhibits no edema or deformity.  Mild tenderness to right anterior shoulder  AROM mildly limited by pain  Distal RUE is NVI  No appreciable deformity of dislocation on exam.   Neurological: He is alert.  Skin: Skin is warm and dry.  Psychiatric: He has a normal mood and affect.  Nursing note and vitals reviewed.    ED Treatments / Results  Labs (all labs ordered are listed, but only abnormal results are displayed) Labs Reviewed - No data to display  EKG  EKG Interpretation None       Radiology Dg Shoulder Right  Result Date: 05/27/2017 CLINICAL DATA:  Sudden onset of right shoulder pain.  Deformity. EXAM: RIGHT SHOULDER - 2+ VIEW COMPARISON:  None. FINDINGS: Inferior subluxation of the humeral head. No luxatio erecta. Osseous density adjacent to the glenoid concerning for fracture fragment, donor site uncertain. The acromioclavicular joint is congruent. Instance note of calcified granuloma in the right lung. IMPRESSION: Inferior subluxation of the humeral head. Osseous density adjacent to the glenoid suspicious for acute fracture, donor site uncertain. Electronically Signed   By: Jeb Levering M.D.   On: 05/27/2017 06:11    Procedures Procedures (including critical care time)  Medications Ordered in ED Medications  fentaNYL (SUBLIMAZE) injection 50 mcg (50 mcg Intravenous Given 05/27/17 0647)  morphine 4 MG/ML injection 4 mg (not administered)     Initial Impression / Assessment and Plan / ED Course  I have reviewed the triage vital signs and the nursing notes.  Pertinent labs & imaging results that were available during my care of the patient were reviewed by me and considered in my medical decision making (see chart for details).     MSE Complete  Presentation is not consistent with dislocation or acute  fracture.  After ranging and manipulation of the shoulder repeat films were obtained - previously seen subluxation is improved.  Patient already has established orthopedic care and he plans to follow-up with them.  He feels much improved after treatment in the ED.  Home care instructions were given and understood by the patient and his family.  Strict return  instructions were given and understood.  Final Clinical Impressions(s) / ED Diagnoses   Final diagnoses:  Right shoulder pain, unspecified chronicity    ED Discharge Orders        Ordered    oxyCODONE-acetaminophen (PERCOCET/ROXICET) 5-325 MG tablet  Every 6 hours PRN     05/27/17 0916       Valarie Merino, MD 05/27/17 [phone]

## 2017-05-31 ENCOUNTER — Other Ambulatory Visit: Payer: Self-pay | Admitting: Cardiovascular Disease

## 2017-07-02 ENCOUNTER — Other Ambulatory Visit: Payer: Self-pay | Admitting: Cardiovascular Disease

## 2017-07-07 ENCOUNTER — Other Ambulatory Visit: Payer: Self-pay | Admitting: Cardiovascular Disease

## 2017-07-09 ENCOUNTER — Ambulatory Visit: Payer: Medicare Other | Admitting: Cardiovascular Disease

## 2017-07-09 ENCOUNTER — Encounter: Payer: Self-pay | Admitting: Cardiovascular Disease

## 2017-07-09 VITALS — BP 126/80 | HR 89 | Ht 71.0 in | Wt 301.1 lb

## 2017-07-09 DIAGNOSIS — I5032 Chronic diastolic (congestive) heart failure: Secondary | ICD-10-CM

## 2017-07-09 DIAGNOSIS — I481 Persistent atrial fibrillation: Secondary | ICD-10-CM | POA: Diagnosis not present

## 2017-07-09 DIAGNOSIS — I4819 Other persistent atrial fibrillation: Secondary | ICD-10-CM

## 2017-07-09 DIAGNOSIS — I10 Essential (primary) hypertension: Secondary | ICD-10-CM

## 2017-07-09 NOTE — Telephone Encounter (Signed)
Pt is 82 yrs old, Wt-136.3kg, Crea-1.16 on 05/01/17, Last seen by Dr. Angelena Form on 04/27/16 & pt has a f/u appt today, CrCl-99.17ml/min. Will send in refill request to requested pharmacy.

## 2017-07-09 NOTE — Progress Notes (Signed)
Chief Complaint  Patient presents with  . Follow-up    Persistent atrial fibrillation    History of Present Illness: 82 yo male with past medical history significant for persistent atrial fibrillation, hyperlipidemia, hypertension, BPH, GERD, COPD and bladder cancer who is here today for follow up. He was seen as a new patient in our office in 2010 for evaluation of an abnormal echo. His echo showed normal left ventricular systolic function with mild LVH and mild diastolic dysfunction, mild LAE, trace MR. He was found to have atrial fibrillation in 2013. Echo in 2013 with normal LV size and function. Successful DCCV 06/18/12. He was seen in our office November 2014 and was back in atrial fibrillation. Since he was asymptomatic, we planned rate control with anti-coagulation without plans for another DCCV. He has been diagnosed with bladder cancer in September 2018. He had resection of the bladder tumor. He is now on chemotherapy.   He is here today for follow up. The patient denies any chest pain, dyspnea, palpitations, lower extremity edema, orthopnea, PND, dizziness, near syncope or syncope.   Primary Care Physician: Alroy Dust, L.Marlou Sa, MD  Past Medical History:  Diagnosis Date  . Asthma    "as a child"  . Atrial fibrillation ([place])    a. Echo 05/27/12:  Mild LVH, EF 55%, aortic sclerosis, no AS, mild LAE, mild RVE, moderate RV dysfunction, mild RAE, PASP 32;  b. Xarelto started 05/22/12  . COPD (chronic obstructive pulmonary disease) ([place])   . Dysrhythmia    a fib  . Edema    FEET/LEGS  . Enlarged prostate   . GERD (gastroesophageal reflux disease)   . Hearing loss in right ear   . HLD (hyperlipidemia)   . HTN (hypertension)   . Neuropathy   . Osteoarthrosis, unspecified whether generalized or localized, unspecified site   . Overweight(278.02)   . Right knee pain    awaiting knee replacement  . Shortness of breath dyspnea   . Wheezing     Past Surgical History:  Procedure  Laterality Date  . BACK SURGERY     2011  . CARDIOVERSION  06/18/2012   Procedure: CARDIOVERSION;  Surgeon: Josue Hector, MD;  Location: [facility] ENDOSCOPY;  Service: Cardiovascular;  Laterality: N/A;  . CATARACT EXTRACTION W/PHACO Right 09/07/2016   Procedure: CATARACT EXTRACTION PHACO AND INTRAOCULAR LENS PLACEMENT ([place]);  Surgeon: Eulogio Bear, MD;  Location: [facility] ORS;  Service: Ophthalmology;  Laterality: Right;  [place] 42.9 AP% 10.0 CDE 4.52 Fluid pack lot # [id number]  . CATARACT EXTRACTION W/PHACO Left 10/12/2016   Procedure: CATARACT EXTRACTION PHACO AND INTRAOCULAR LENS PLACEMENT (IOC);  Surgeon: Eulogio Bear, MD;  Location: [facility] ORS;  Service: Ophthalmology;  Laterality: Left;  [place] 34.2 AP% 9.0 CDE 3.17 Fluid pack lot # [id number] H  . COLONOSCOPY    . JOINT REPLACEMENT    . SPINAL CORD STIMULATOR INSERTION N/A 02/18/2016   Procedure: LUMBAR SPINAL CORD STIMULATOR INSERTION;  Surgeon: Clydell Hakim, MD;  Location: [facility]O ORS;  Service: Neurosurgery;  Laterality: N/A;  LUMBAR SPINAL CORD STIMULATOR INSERTION  . TOTAL KNEE ARTHROPLASTY Right 08/07/2012   Procedure: TOTAL KNEE ARTHROPLASTY;  Surgeon: Kerin Salen, MD;  Location: [facility];  Service: Orthopedics;  Laterality: Right;  . TRANSURETHRAL RESECTION OF BLADDER TUMOR N/A 05/04/2017   Procedure: TRANSURETHRAL RESECTION OF BLADDER TUMOR (TURBT)/ INTRAVESICAL CHEMOTHERAPY INSTILLATION;  Surgeon: Kathie Rhodes, MD;  Location: [facility] ORS;  Service: Urology;  Laterality: N/A;    Current Outpatient Medications  Medication Sig Dispense Refill  . amLODipine (NORVASC) 5 MG tablet Take 5 mg by mouth daily.     . benazepril (LOTENSIN) 10 MG tablet Take 10 mg by mouth at bedtime.     . celecoxib (CELEBREX) 200 MG capsule Take 200 mg by mouth daily with supper.     . doxazosin (CARDURA) 4 MG tablet Take 4 mg by mouth at bedtime.     . famotidine (PEPCID) 20 MG tablet Take 1 tablet (20 mg total) by mouth daily. Please keep upcoming appt for future  refills. Thank you 30 tablet 0  . finasteride (PROSCAR) 5 MG tablet Take 5 mg by mouth at bedtime.     . furosemide (LASIX) 40 MG tablet Take 40 mg by mouth daily.    . metoprolol tartrate (LOPRESSOR) 50 MG tablet Take 1 tablet (50 mg total) by mouth 2 (two) times daily. Please keep upcoming appt for future refills. Thank you 60 tablet 0  . Nutritional Supplements (JUICE PLUS FIBRE PO) Take 6 tablets by mouth daily. 2 nuts tablets, 2 fruit tablets, and 2 veggie tablets    . potassium chloride (MICRO-K) 10 MEQ CR capsule Take 10 mEq by mouth every other day.     Marland Kitchen PROAIR HFA 108 (90 Base) MCG/ACT inhaler Inhale 2 puffs into the lungs every 4 (four) hours as needed for shortness of breath or wheezing.     [name] 625 MG tablet Take 1,250 mg by mouth 2 (two) times daily with a meal.     . XARELTO 20 MG TABS tablet TAKE 1 TABLET BY MOUTH ONCE DAILY 30 tablet 11   No current facility-administered medications for this visit.     No Known Allergies  Social History   Socioeconomic History  . Marital status: Married    Spouse name: Not on file  . Number of children: 2  . Years of education: Not on file  . Highest education level: Not on file  Social Needs  . Financial resource strain: Not on file  . Food insecurity - worry: Not on file  . Food insecurity - inability: Not on file  . Transportation needs - medical: Not on file  . Transportation needs - non-medical: Not on file  Occupational History  . Occupation: Probation officer [employer]  Tobacco Use  . Smoking status: Former Smoker    Packs/day: 1.00    Years: 20.00    Pack years: 20.00    Types: Cigarettes    Last attempt to quit: 05/22/1977    Years since quitting: 40.1  . Smokeless tobacco: Never Used  . Tobacco comment: Quit 1978  Substance and Sexual Activity  . Alcohol use: No  . Drug use: No  . Sexual activity: Not on file  Other Topics Concern  . Not on file  Social History Narrative   Retired. Married.  Regularly exercises.     Family History  Problem Relation Age of Onset  . Cancer Other   . Heart attack Brother 75    Review of Systems:  As stated in the HPI and otherwise negative.   BP 126/80   Pulse 89   Ht 5\' 11"  (1.803 m)   Wt (!) 301 lb 1.9 oz (136.6 kg)   SpO2 97%   BMI 42.00 kg/m   Physical Examination:  General: Well developed, well nourished, NAD  HEENT: OP clear, mucus membranes moist  SKIN: warm, dry. No rashes. Neuro: No focal deficits  Musculoskeletal: Muscle strength 5/5 all ext  Psychiatric: Mood and affect normal  Neck: No JVD, no carotid bruits, no thyromegaly, no lymphadenopathy.  Lungs:Clear bilaterally, no wheezes, rhonci, crackles Cardiovascular: Irreg irreg. No murmurs, gallops or rubs. Abdomen:Soft. Bowel sounds present. Non-tender.  Extremities: Trace bilateral lower extremity edema. Pulses are 2 + in the bilateral DP/PT.  EKG:  EKG is not  ordered today. The ekg ordered today demonstrates  Recent Labs: 05/01/2017: BUN 25; Creatinine, Ser 1.16; Hemoglobin 12.6; Platelets 194; Potassium 4.7; Sodium 141   Lipid Panel No results found for: CHOL, TRIG, HDL, CHOLHDL, VLDL, LDLCALC, LDLDIRECT   Wt Readings from Last 3 Encounters:  07/09/17 (!) 301 lb 1.9 oz (136.6 kg)  05/27/17 300 lb (136.1 kg)  05/04/17 (!) 302 lb 2 oz (137 kg)     Other studies Reviewed: Additional studies/ records that were reviewed today include: . Review of the above records demonstrates:    Assessment and Plan:   1. Atrial fibrillation, persistent: He is in atrial fibrillation today. He has no symptoms from this. Will continue rate control strategy. Will continue metoprolol 50 mg po BID. Will continue Xarelto.     2.HTN: BP is controlled. No changes today.   3. Chronic diastolic CHF: Weight is stable. LE edema is mild and stable. Continue Lasix.   Current medicines are reviewed at length with the patient today.  The patient does not have concerns regarding  medicines.  The following changes have been made:  no change  Labs/ tests ordered today include:   No orders of the defined types were placed in this encounter.   Disposition:   FU with me in 12  months  Signed, Lauree Chandler, MD 07/09/2017 10:22 AM    [facility] [address], [place], [place]  [address] Phone: [phone]; Fax: [phone]

## 2017-07-09 NOTE — Patient Instructions (Signed)

## 2017-08-03 ENCOUNTER — Other Ambulatory Visit: Payer: Self-pay | Admitting: Cardiovascular Disease

## 2017-12-20 ENCOUNTER — Encounter (HOSPITAL_BASED_OUTPATIENT_CLINIC_OR_DEPARTMENT_OTHER): Payer: Self-pay | Admitting: Emergency Medicine

## 2017-12-20 ENCOUNTER — Other Ambulatory Visit: Payer: Self-pay

## 2017-12-20 ENCOUNTER — Emergency Department (HOSPITAL_BASED_OUTPATIENT_CLINIC_OR_DEPARTMENT_OTHER): Payer: Medicare Other

## 2017-12-20 ENCOUNTER — Emergency Department (HOSPITAL_BASED_OUTPATIENT_CLINIC_OR_DEPARTMENT_OTHER)
Admission: EM | Admit: 2017-12-20 | Discharge: 2017-12-20 | Disposition: A | Discharge status: Home / Self Care | Payer: Medicare Other | Attending: Emergency Medicine | Admitting: Emergency Medicine

## 2017-12-20 DIAGNOSIS — R1013 Epigastric pain: Secondary | ICD-10-CM | POA: Diagnosis present

## 2017-12-20 DIAGNOSIS — Z96651 Presence of right artificial knee joint: Secondary | ICD-10-CM | POA: Diagnosis not present

## 2017-12-20 DIAGNOSIS — Z87891 Personal history of nicotine dependence: Secondary | ICD-10-CM | POA: Insufficient documentation

## 2017-12-20 DIAGNOSIS — I5032 Chronic diastolic (congestive) heart failure: Secondary | ICD-10-CM | POA: Insufficient documentation

## 2017-12-20 DIAGNOSIS — Z79899 Other long term (current) drug therapy: Secondary | ICD-10-CM | POA: Diagnosis not present

## 2017-12-20 DIAGNOSIS — I714 Abdominal aortic aneurysm, without rupture, unspecified: Secondary | ICD-10-CM

## 2017-12-20 DIAGNOSIS — I4891 Unspecified atrial fibrillation: Secondary | ICD-10-CM | POA: Diagnosis not present

## 2017-12-20 DIAGNOSIS — K449 Diaphragmatic hernia without obstruction or gangrene: Secondary | ICD-10-CM | POA: Insufficient documentation

## 2017-12-20 DIAGNOSIS — I110 Hypertensive heart disease with heart failure: Secondary | ICD-10-CM | POA: Insufficient documentation

## 2017-12-20 DIAGNOSIS — J449 Chronic obstructive pulmonary disease, unspecified: Secondary | ICD-10-CM | POA: Insufficient documentation

## 2017-12-20 LAB — CBC WITH DIFFERENTIAL/PLATELET
Basophils Absolute: 0 10*3/uL (ref 0.0–0.1)
Basophils Relative: 0 %
EOS ABS: 0.2 10*3/uL (ref 0.0–0.7)
EOS PCT: 2 %
HCT: 33.2 % — ABNORMAL LOW (ref 39.0–52.0)
Hemoglobin: 11.2 g/dL — ABNORMAL LOW (ref 13.0–17.0)
LYMPHS ABS: 0.8 10*3/uL (ref 0.7–4.0)
LYMPHS PCT: 11 %
MCH: 30.9 pg (ref 26.0–34.0)
MCHC: 33.7 g/dL (ref 30.0–36.0)
MCV: 91.5 fL (ref 78.0–100.0)
MONO ABS: 0.7 10*3/uL (ref 0.1–1.0)
MONOS PCT: 9 %
Neutro Abs: 5.8 10*3/uL (ref 1.7–7.7)
Neutrophils Relative %: 78 %
PLATELETS: 163 10*3/uL (ref 150–400)
RBC: 3.63 MIL/uL — AB (ref 4.22–5.81)
RDW: 15.9 % — ABNORMAL HIGH (ref 11.5–15.5)
WBC: 7.5 10*3/uL (ref 4.0–10.5)

## 2017-12-20 LAB — COMPREHENSIVE METABOLIC PANEL
ALT: 43 U/L (ref 0–44)
ANION GAP: 6 (ref 5–15)
AST: 55 U/L — ABNORMAL HIGH (ref 15–41)
Albumin: 3.4 g/dL — ABNORMAL LOW (ref 3.5–5.0)
Alkaline Phosphatase: 48 U/L (ref 38–126)
BUN: 28 mg/dL — ABNORMAL HIGH (ref 8–23)
CHLORIDE: 108 mmol/L (ref 98–111)
CO2: 23 mmol/L (ref 22–32)
Calcium: 8.2 mg/dL — ABNORMAL LOW (ref 8.9–10.3)
Creatinine, Ser: 1.25 mg/dL — ABNORMAL HIGH (ref 0.61–1.24)
GFR calc Af Amer: >60 mL/min (ref >60)
GFR, EST NON AFRICAN AMERICAN: 53 mL/min — AB (ref >60)
Glucose, Bld: 132 mg/dL — ABNORMAL HIGH (ref 70–99)
Potassium: 4.2 mmol/L (ref 3.5–5.1)
Sodium: 137 mmol/L (ref 135–145)
Total Bilirubin: 0.8 mg/dL (ref 0.3–1.2)
Total Protein: 6.7 g/dL (ref 6.5–8.1)

## 2017-12-20 LAB — LIPASE, BLOOD: LIPASE: 33 U/L (ref 11–51)

## 2017-12-20 MED ORDER — CYCLOBENZAPRINE HCL 5 MG PO TABS: 5.0000 mg | ORAL_TABLET | Freq: Once | ORAL | Status: DC

## 2017-12-20 MED ORDER — IOPAMIDOL (ISOVUE-300) INJECTION 61%
100.0000 mL | Freq: Once | INTRAVENOUS | Status: AC | PRN
  Administered 2017-12-20: 100 mL via INTRAVENOUS

## 2017-12-20 MED ORDER — CYCLOBENZAPRINE HCL 5 MG PO TABS: 5.0000 mg | ORAL_TABLET | Freq: Every evening | ORAL | 0 refills | Status: DC | PRN

## 2017-12-20 NOTE — Discharge Instructions (Signed)
Try Flexeril for muscle pain Please follow up with your doctor about your CT results today. A copy has been attached to your paperwork Please return if you are worsening

## 2017-12-20 NOTE — ED Provider Notes (Signed)
[facility] EMERGENCY DEPARTMENT Provider Note   CSN: [id number] Arrival date & time: 12/20/17  1545     History   Chief Complaint Chief Complaint  Patient presents with  . Abdominal Pain    HPI John Stark is a 82 y.o. male who presents with abdominal pain. PMH significant for A.fib on Xarelto, COPD, HTN, HLD, hx of bladder cancer. He states that he's had epigastric abdominal pain for the past three days. He thinks it is due to a muscle because it feels sore and is tender. It is non-radiating. It hurts when he moves a certain way, especially when he leans over. Nothing has made it better. He went to [facility] walk in clinic and was advised to come to the ED for a CT scan. He denies fever, chills, chest pain, SOB, N/V/D, constipation, urinary symptoms. He has had this pain in the past but cannot recall what was done about it. He denies any prior abdominal surgeries. He has had a colonoscopy which was normal. No hx of EGD. He takes Pepcid daily.  HPI  Past Medical History:  Diagnosis Date  . Asthma    "as a child"  . Atrial fibrillation ([place])    a. Echo 05/27/12:  Mild LVH, EF 55%, aortic sclerosis, no AS, mild LAE, mild RVE, moderate RV dysfunction, mild RAE, PASP 32;  b. Xarelto started 05/22/12  . COPD (chronic obstructive pulmonary disease) ([place])   . Dysrhythmia    a fib  . Edema    FEET/LEGS  . Enlarged prostate   . GERD (gastroesophageal reflux disease)   . Hearing loss in right ear   . HLD (hyperlipidemia)   . HTN (hypertension)   . Neuropathy   . Osteoarthrosis, unspecified whether generalized or localized, unspecified site   . Overweight(278.02)   . Right knee pain    awaiting knee replacement  . Shortness of breath dyspnea   . Wheezing     Patient Active Problem List   Diagnosis Date Noted  . Osteoarthritis of right knee 08/07/2012  . Atrial fibrillation ([place]) 06/05/2012  . DIASTOLIC HEART FAILURE, CHRONIC 06/15/2009  . HYPERLIPIDEMIA-MIXED  12/04/2008  . OVERWEIGHT/OBESITY 12/04/2008  . HYPERTENSION, UNSPECIFIED 12/04/2008  . GERD 12/04/2008  . OSTEOARTHRITIS 12/04/2008  . PALPITATIONS 12/04/2008    Past Surgical History:  Procedure Laterality Date  . BACK SURGERY     2011  . CARDIOVERSION  06/18/2012   Procedure: CARDIOVERSION;  Surgeon: Josue Hector, MD;  Location: [facility] ENDOSCOPY;  Service: Cardiovascular;  Laterality: N/A;  . CATARACT EXTRACTION W/PHACO Right 09/07/2016   Procedure: CATARACT EXTRACTION PHACO AND INTRAOCULAR LENS PLACEMENT ([place]);  Surgeon: Eulogio Bear, MD;  Location: [facility] ORS;  Service: Ophthalmology;  Laterality: Right;  [place] 42.9 AP% 10.0 CDE 4.52 Fluid pack lot # [id number]  . CATARACT EXTRACTION W/PHACO Left 10/12/2016   Procedure: CATARACT EXTRACTION PHACO AND INTRAOCULAR LENS PLACEMENT (I[facility]);  Surgeon: Eulogio Bear, MD;  Location: [facility] ORS;  Service: Ophthalmology;  Laterality: Left;  [place] 34.2 AP% 9.0 CDE 3.17 Fluid pack lot # [id number]  . COLONOSCOPY    . JOINT REPLACEMENT    . SPINAL CORD STIMULATOR INSERTION N/A 02/18/2016   Procedure: LUMBAR SPINAL CORD STIMULATOR INSERTION;  Surgeon: Clydell Hakim, MD;  Location: [facility] NEURO ORS;  Service: Neurosurgery;  Laterality: N/A;  LUMBAR SPINAL CORD STIMULATOR INSERTION  . TOTAL KNEE ARTHROPLASTY Right 08/07/2012   Procedure: TOTAL KNEE ARTHROPLASTY;  Surgeon: Kerin Salen, MD;  Location: [facility];  Service: Orthopedics;  Laterality: Right;  . TRANSURETHRAL RESECTION OF BLADDER TUMOR N/A 05/04/2017   Procedure: TRANSURETHRAL RESECTION OF BLADDER TUMOR (TURBT)/ INTRAVESICAL CHEMOTHERAPY INSTILLATION;  Surgeon: Kathie Rhodes, MD;  Location: [facility] ORS;  Service: Urology;  Laterality: N/A;        Home Medications    Prior to Admission medications   Medication Sig Start Date End Date Taking? Authorizing Provider  amLODipine (NORVASC) 5 MG tablet Take 5 mg by mouth daily.     [provider]  benazepril (LOTENSIN) 10 MG tablet Take 10 mg by mouth  at bedtime.     [provider]  celecoxib (CELEBREX) 200 MG capsule Take 200 mg by mouth daily with supper.     [provider]  doxazosin (CARDURA) 4 MG tablet Take 4 mg by mouth at bedtime.     [provider]  famotidine (PEPCID) 20 MG tablet Take 1 tablet (20 mg total) by mouth daily. 08/03/17   Burnell Blanks, MD  finasteride (PROSCAR) 5 MG tablet Take 5 mg by mouth at bedtime.     [provider]  furosemide (LASIX) 40 MG tablet Take 40 mg by mouth daily.    [provider]  metoprolol tartrate (LOPRESSOR) 50 MG tablet Take 1 tablet (50 mg total) by mouth 2 (two) times daily. 08/03/17   Burnell Blanks, MD  Nutritional Supplements (JUICE PLUS FIBRE PO) Take 6 tablets by mouth daily. 2 nuts tablets, 2 fruit tablets, and 2 veggie tablets    [provider]  potassium chloride (MICRO-K) 10 MEQ CR capsule Take 10 mEq by mouth every other day.     [provider]  PROAIR HFA 108 [phone] Base) MCG/ACT inhaler Inhale 2 puffs into the lungs every 4 (four) hours as needed for shortness of breath or wheezing.  04/20/16   [provider]  WELCHOL 625 MG tablet Take 1,250 mg by mouth 2 (two) times daily with a meal.  07/16/13   [provider]  XARELTO 20 MG TABS tablet TAKE 1 TABLET BY MOUTH ONCE DAILY 07/09/17   Burnell Blanks, MD    Family History Family History  Problem Relation Age of Onset  . Cancer Other   . Heart attack Brother 2    Social History Social History   Tobacco Use  . Smoking status: Former Smoker    Packs/day: 1.00    Years: 20.00    Pack years: 20.00    Types: Cigarettes    Last attempt to quit: 05/22/1977    Years since quitting: 40.6  . Smokeless tobacco: Never Used  . Tobacco comment: Quit 1978  Substance Use Topics  . Alcohol use: No  . Drug use: No     Allergies   Patient has no known allergies.   Review of Systems Review of Systems  Constitutional:  Negative for chills and fever.  Respiratory: Negative for shortness of breath.   Cardiovascular: Negative for chest pain.  Gastrointestinal: Positive for abdominal pain. Negative for diarrhea, nausea and vomiting.  Genitourinary: Negative for dysuria and flank pain.  All other systems reviewed and are negative.    Physical Exam Updated Vital Signs BP (!) 148/89   Pulse 79   Temp 98 F (36.7 C) (Oral)   Resp 18   Ht 5\' 11"  (1.803 m)   Wt 135.2 kg (298 lb)   SpO2 94%   BMI 41.56 kg/m   Physical Exam  Constitutional: He is oriented to person, place, and  time. He appears well-developed and well-nourished. No distress.  HENT:  Head: Normocephalic and atraumatic.  Eyes: Pupils are equal, round, and reactive to light. Conjunctivae are normal. Right eye exhibits no discharge. Left eye exhibits no discharge. No scleral icterus.  Neck: Normal range of motion.  Cardiovascular: Normal rate. An irregularly irregular rhythm present.  Pulmonary/Chest: Effort normal. Tachypnea noted. No respiratory distress. He has wheezes (scattered).  Abdominal: Soft. Bowel sounds are normal. He exhibits no distension and no mass. There is tenderness. There is no rebound and no guarding. No hernia.  Obese. Tender over epigastric area  Musculoskeletal:  2+ peripheral edema  Neurological: He is alert and oriented to person, place, and time.  Skin: Skin is warm and dry.  Psychiatric: He has a normal mood and affect. His behavior is normal.  Nursing note and vitals reviewed.    ED Treatments / Results  Labs (all labs ordered are listed, but only abnormal results are displayed) Labs Reviewed  CBC WITH DIFFERENTIAL/PLATELET - Abnormal; Notable for the following components:      Result Value   RBC 3.63 (*)    Hemoglobin 11.2 (*)    HCT 33.2 (*)    RDW 15.9 (*)    All other components within normal limits  COMPREHENSIVE METABOLIC PANEL - Abnormal; Notable for the following components:   Glucose, Bld  132 (*)    BUN 28 (*)    Creatinine, Ser 1.25 (*)    Calcium 8.2 (*)    Albumin 3.4 (*)    AST 55 (*)    GFR calc non Af Amer 53 (*)    All other components within normal limits  LIPASE, BLOOD    EKG EKG Interpretation  Date/Time:  Thursday December 20 2017 16:31:25 EDT Ventricular Rate:  88 PR Interval:    QRS Duration: 116 QT Interval:  385 QTC Calculation: 466 R Axis:   61 Text Interpretation:  Atrial fibrillation Incomplete right bundle branch block Low voltage, precordial leads No significant change since last tracing Confirmed by Blanchie Dessert [phone]) on 12/20/2017 4:49:17 PM   Radiology Ct Abdomen Pelvis W Contrast  Result Date: 12/20/2017 CLINICAL DATA:  Epigastric pain for 3 days. Bladder tumor resection. EXAM: CT ABDOMEN AND PELVIS WITH CONTRAST TECHNIQUE: Multidetector CT imaging of the abdomen and pelvis was performed using the standard protocol following bolus administration of intravenous contrast. CONTRAST:  169mL ISOVUE-300 IOPAMIDOL (ISOVUE-300) INJECTION 61% COMPARISON:  None. FINDINGS: Lower chest: Trace bilateral pleural effusions. Borderline cardiomegaly without pericardial effusion. Right coronary arteriosclerosis as before. Small to moderate-sized hiatal hernia. Hepatobiliary: Hepatic steatosis. Unremarkable gallbladder. No biliary dilatation. Pancreas: Normal Spleen: Atrophic pancreas without ductal dilatation, inflammation or mass. Adrenals/Urinary Tract: Nonspecific mild perinephric fat stranding without obstructive uropathy, calculus or solid enhancing mass. Bilateral renal cortical thinning. 1.6 cm cyst in the upper pole of the left kidney is noted. No hydroureteronephrosis. Herniation of the anterolateral aspect of the bladder into the right inguinal canal. Slight thickening along the posterior wall of the urinary bladder. The focus of hyperenhancement along the posterior right lateral bladder wall is not apparent this study. Stomach/Bowel: Decompressed stomach  with normal duodenal sweep and ligament of Treitz position. Normal appearing appendix. Colonic diverticulosis is noted at the junction of the descending sigmoid colon. No acute bowel inflammation. Vascular/Lymphatic: Aortoiliac atherosclerosis. Infrarenal abdominal aortic aneurysm is redemonstrated measuring 4.7 x 3.9 cm versus 4.4 x 4.1 cm in estimation previously. Reproductive: Normal prostate. Other: Bilateral fat containing inguinal hernias a portion of the bladder  as previously stated in the right inguinal canal. This is a chronic finding. Musculoskeletal: Spinal stimulator is noted with prior lumbar laminectomy is advanced lumbosacral spondylosis. IMPRESSION: 1. Slightly larger infrarenal abdominal aortic aneurysm measuring 4.7 x 3.9 cm versus 4.4 x 4 1 cm. Recommend followup by abdomen and pelvis CTA in 6 months, and vascular surgery referral/consultation if not already obtained. This recommendation follows ACR consensus guidelines: White Paper of the ACR Incidental Findings Committee II on Vascular Findings. J Am Coll Radiol 2013; 10:789-794. 2. Trace bilateral pleural effusions with right coronary arteriosclerosis and borderline cardiomegaly. Moderate size hiatal hernia. 3. Bilateral fat containing inguinal hernias with the right inguinal canal also containing a portion of the bladder as before. 4. Stable cyst in the upper pole left kidney measuring 1.6 cm. Minimal thickening along the posterior wall of the urinary bladder some which is likely due to underdistention. The area of hyperenhancement previously noted is no longer identified. 5. Colonic diverticulosis without acute diverticulitis. Electronically Signed   By: Ashley Royalty M.D.   On: 12/20/2017 18:02    Procedures Procedures (including critical care time)  Medications Ordered in ED Medications  cyclobenzaprine (FLEXERIL) tablet 5 mg (5 mg Oral Not Given 12/20/17 1740)  iopamidol (ISOVUE-300) 61 % injection 100 mL (100 mLs Intravenous  Contrast Given 12/20/17 1737)     Initial Impression / Assessment and Plan / ED Course  I have reviewed the triage vital signs and the nursing notes.  Pertinent labs & imaging results that were available during my care of the patient were reviewed by me and considered in my medical decision making (see chart for details).  82 year old male presents with epigastric abdominal pain for three days. Unclear etiology. It may be MSK based on his description of symptoms but also considered gastritis/PUD, biliary etiology however symptoms have no association with food. Less likely aortic dissection or rupture since he is very comfortable appearing. Will obtain labs, EKG, CT. Shared visit with Dr. Maryan Rued  CBC is remarkable for mild anemia. CMP and lipase are overall unremarkable/at baseline. CT shows small-moderate hiatal hernia, AAA, inguinal hernias. Discussed results with the patient. He seems to feel like the hiatal hernia is the cause of his symptoms. Apparently he has tried a muscle relaxer for this in the past. We will try this however he was advised to f/u with his doctor regarding CT findings since he was unaware he had an AAA. He verbalized understanding.  Final Clinical Impressions(s) / ED Diagnoses   Final diagnoses:  Epigastric abdominal pain  Abdominal aortic aneurysm (AAA) without rupture [facility])  Hiatal hernia    ED Discharge Orders    None       Iris Pert 12/20/17 2010    Blanchie Dessert, MD 12/21/17 1359

## 2017-12-20 NOTE — ED Triage Notes (Signed)
Patient reports upper abdominal pain x 3 days.  Denies N/V/D.  Sent by [facility]

## 2017-12-20 NOTE — ED Notes (Signed)
Pt. To CT SCAN

## 2017-12-21 ENCOUNTER — Other Ambulatory Visit: Payer: Self-pay | Admitting: Ophthalmology

## 2017-12-21 DIAGNOSIS — G453 Amaurosis fugax: Secondary | ICD-10-CM

## 2017-12-26 ENCOUNTER — Telehealth: Payer: Self-pay | Admitting: *Deleted

## 2017-12-26 NOTE — Telephone Encounter (Signed)
I spoke with John Stark and schedule him to see Dr. Angelena Form on July 8,2019 at 4:20

## 2017-12-26 NOTE — Telephone Encounter (Signed)
Received fax from Dr. George Ina at [facility]. Pt with complaint of partial darkening of vision in the left eye happening intermittently over the past 3 or 4 days. Each episode lasts 1-2 minutes and then resolves. Concern is that pt is having episodes of amaurosis fugax in the left eye. Pt has been scheduled for carotid dopplers.  Fax suggests possibly investigating his heart as an additional source of emboli to his retinal circulation. Dr. George Ina recommended to pt that he continue Xarelto and hydrate well. I placed call to pt and left message to call back.

## 2017-12-31 ENCOUNTER — Ambulatory Visit: Payer: Medicare Other | Admitting: Cardiovascular Disease

## 2017-12-31 ENCOUNTER — Encounter: Payer: Self-pay | Admitting: Cardiovascular Disease

## 2017-12-31 VITALS — BP 118/68 | HR 71 | Ht 71.0 in | Wt 305.8 lb

## 2017-12-31 DIAGNOSIS — I4819 Other persistent atrial fibrillation: Secondary | ICD-10-CM

## 2017-12-31 DIAGNOSIS — I481 Persistent atrial fibrillation: Secondary | ICD-10-CM | POA: Diagnosis not present

## 2017-12-31 DIAGNOSIS — I10 Essential (primary) hypertension: Secondary | ICD-10-CM

## 2017-12-31 DIAGNOSIS — I5032 Chronic diastolic (congestive) heart failure: Secondary | ICD-10-CM | POA: Diagnosis not present

## 2017-12-31 DIAGNOSIS — H539 Unspecified visual disturbance: Secondary | ICD-10-CM | POA: Diagnosis not present

## 2017-12-31 DIAGNOSIS — I714 Abdominal aortic aneurysm, without rupture, unspecified: Secondary | ICD-10-CM

## 2017-12-31 NOTE — Patient Instructions (Signed)
Medication Instructions:  Your physician recommends that you continue on your current medications as directed. Please refer to the Current Medication list given to you today.   Labwork: none  Testing/Procedures: none  Follow-Up: Your physician recommends that you schedule a follow-up appointment in: 6 months. Please call our office in about 2 months to schedule this appointment.  [facility].  You will be called with appointment information.     Any Other Special Instructions Will Be Listed Below (If Applicable).     If you need a refill on your cardiac medications before your next appointment, please call your pharmacy.

## 2017-12-31 NOTE — Progress Notes (Signed)
Chief Complaint  Patient presents with  . Follow-up    Atrial fib/visual changes    History of Present Illness: 82 yo male with past medical history significant for persistent atrial fibrillation, hyperlipidemia, hypertension, BPH, GERD, COPD and bladder cancer who is here today for follow up. He was seen as a new patient in our office in 2010 for evaluation of an abnormal echo. His echo showed normal left ventricular systolic function with mild LVH and mild diastolic dysfunction, mild LAE, trace MR. He was found to have atrial fibrillation in 2013. Echo in 2013 with normal LV size and function. Successful DCCV 06/18/12. He was seen in our office November 2014 and was back in atrial fibrillation. Since he was asymptomatic, we planned rate control with anti-coagulation without plans for another DCCV. He has been diagnosed with bladder cancer in September 2018 and has had resection and chemotherapy.   He is here today for follow up. The patient denies any chest pain, dyspnea, palpitations, lower extremity edema, orthopnea, PND, dizziness, near syncope or syncope. He has been having visual trouble with darkening of his left eye fields and his ophthalmologist is concerned for embolic events. (Dr. George Ina). Carotid dopplers arranged for tomorrow in the [facility] outpatient imaging center per Dr. George Ina.   Primary Care Physician: Alroy Dust, L.Marlou Sa, MD  Past Medical History:  Diagnosis Date  . Asthma    "as a child"  . Atrial fibrillation ([place])    a. Echo 05/27/12:  Mild LVH, EF 55%, aortic sclerosis, no AS, mild LAE, mild RVE, moderate RV dysfunction, mild RAE, PASP 32;  b. Xarelto started 05/22/12  . COPD (chronic obstructive pulmonary disease) ([place])   . Dysrhythmia    a fib  . Edema    FEET/LEGS  . Enlarged prostate   . GERD (gastroesophageal reflux disease)   . Hearing loss in right ear   . HLD (hyperlipidemia)   . HTN (hypertension)   . Neuropathy   . Osteoarthrosis, unspecified whether  generalized or localized, unspecified site   . Overweight(278.02)   . Right knee pain    awaiting knee replacement  . Shortness of breath dyspnea   . Wheezing     Past Surgical History:  Procedure Laterality Date  . BACK SURGERY     2011  . CARDIOVERSION  06/18/2012   Procedure: CARDIOVERSION;  Surgeon: Josue Hector, MD;  Location: [facility] ENDOSCOPY;  Service: Cardiovascular;  Laterality: N/A;  . CATARACT EXTRACTION W/PHACO Right 09/07/2016   Procedure: CATARACT EXTRACTION PHACO AND INTRAOCULAR LENS PLACEMENT ([place]);  Surgeon: Eulogio Bear, MD;  Location: [facility] ORS;  Service: Ophthalmology;  Laterality: Right;  [place] 42.9 AP% 10.0 CDE 4.52 Fluid pack lot # [id number]  . CATARACT EXTRACTION W/PHACO Left 10/12/2016   Procedure: CATARACT EXTRACTION PHACO AND INTRAOCULAR LENS PLACEMENT (I[facility]);  Surgeon: Eulogio Bear, MD;  Location: [facility] ORS;  Service: Ophthalmology;  Laterality: Left;  [place] 34.2 AP% 9.0 CDE 3.17 Fluid pack lot # [id number]  . COLONOSCOPY    . JOINT REPLACEMENT    . SPINAL CORD STIMULATOR INSERTION N/A 02/18/2016   Procedure: LUMBAR SPINAL CORD STIMULATOR INSERTION;  Surgeon: Clydell Hakim, MD;  Location: [place] NEURO ORS;  Service: Neurosurgery;  Laterality: N/A;  LUMBAR SPINAL CORD STIMULATOR INSERTION  . TOTAL KNEE ARTHROPLASTY Right 08/07/2012   Procedure: TOTAL KNEE ARTHROPLASTY;  Surgeon: Kerin Salen, MD;  Location: [place];  Service: Orthopedics;  Laterality: Right;  . TRANSURETHRAL RESECTION OF BLADDER TUMOR N/A 05/04/2017   Procedure:  TRANSURETHRAL RESECTION OF BLADDER TUMOR (TURBT)/ INTRAVESICAL CHEMOTHERAPY INSTILLATION;  Surgeon: Kathie Rhodes, MD;  Location: [facility] ORS;  Service: Urology;  Laterality: N/A;    Current Outpatient Medications  Medication Sig Dispense Refill  . amLODipine (NORVASC) 5 MG tablet Take 5 mg by mouth daily.     . benazepril (LOTENSIN) 20 MG tablet     . cyclobenzaprine (FLEXERIL) 5 MG tablet Take 1 tablet (5 mg total) by mouth at bedtime and may  repeat dose one time if needed. 20 tablet 0  . doxazosin (CARDURA) 4 MG tablet Take 4 mg by mouth at bedtime.     . famotidine (PEPCID) 20 MG tablet Take 1 tablet (20 mg total) by mouth daily. 90 tablet 3  . finasteride (PROSCAR) 5 MG tablet Take 5 mg by mouth at bedtime.     . furosemide (LASIX) 40 MG tablet Take 40 mg by mouth daily.    . metoprolol tartrate (LOPRESSOR) 50 MG tablet Take 1 tablet (50 mg total) by mouth 2 (two) times daily. 180 tablet 3  . Nutritional Supplements (JUICE PLUS FIBRE PO) Take 6 tablets by mouth daily. 2 nuts tablets, 2 fruit tablets, and 2 veggie tablets    . potassium chloride (MICRO-K) 10 MEQ CR capsule Take 10 mEq by mouth every other day.     [name] 625 MG tablet Take 1,250 mg by mouth 2 (two) times daily with a meal.     . XARELTO 20 MG TABS tablet TAKE 1 TABLET BY MOUTH ONCE DAILY 30 tablet 11  . benazepril (LOTENSIN) 10 MG tablet Take 10 mg by mouth at bedtime.     . celecoxib (CELEBREX) 200 MG capsule Take 200 mg by mouth daily with supper.     Marland Kitchen PROAIR HFA 108 (90 Base) MCG/ACT inhaler Inhale 2 puffs into the lungs every 4 (four) hours as needed for shortness of breath or wheezing.      No current facility-administered medications for this visit.     No Known Allergies  Social History   Socioeconomic History  . Marital status: Married    Spouse name: Not on file  . Number of children: 2  . Years of education: Not on file  . Highest education level: Not on file  Occupational History  . Occupation: Probation officer [employer]  Social Needs  . Financial resource strain: Not on file  . Food insecurity:    Worry: Not on file    Inability: Not on file  . Transportation needs:    Medical: Not on file    Non-medical: Not on file  Tobacco Use  . Smoking status: Former Smoker    Packs/day: 1.00    Years: 20.00    Pack years: 20.00    Types: Cigarettes    Last attempt to quit: 05/22/1977    Years since quitting: 40.6  . Smokeless  tobacco: Never Used  . Tobacco comment: Quit 1978  Substance and Sexual Activity  . Alcohol use: No  . Drug use: No  . Sexual activity: Not on file  Lifestyle  . Physical activity:    Days per week: Not on file    Minutes per session: Not on file  . Stress: Not on file  Relationships  . Social connections:    Talks on phone: Not on file    Gets together: Not on file    Attends religious service: Not on file    Active member of club or organization: Not on file  Attends meetings of clubs or organizations: Not on file    Relationship status: Not on file  . Intimate partner violence:    Fear of current or ex partner: Not on file    Emotionally abused: Not on file    Physically abused: Not on file    Forced sexual activity: Not on file  Other Topics Concern  . Not on file  Social History Narrative   Retired. Married. Regularly exercises.     Family History  Problem Relation Age of Onset  . Cancer Other   . Heart attack Brother 75    Review of Systems:  As stated in the HPI and otherwise negative.   BP 118/68   Pulse 71   Ht 5\' 11"  (1.803 m)   Wt (!) 305 lb 12.8 oz (138.7 kg)   SpO2 94%   BMI 42.65 kg/m   Physical Examination:  General: Well developed, well nourished, NAD  HEENT: OP clear, mucus membranes moist  SKIN: warm, dry. No rashes. Neuro: No focal deficits  Musculoskeletal: Muscle strength 5/5 all ext  Psychiatric: Mood and affect normal  Neck: No JVD, no carotid bruits, no thyromegaly, no lymphadenopathy.  Lungs:Clear bilaterally, no wheezes, rhonci, crackles Cardiovascular: Regular rate and rhythm. No murmurs, gallops or rubs. Abdomen:Soft. Bowel sounds present. Non-tender.  Extremities: No lower extremity edema. Pulses are 2 + in the bilateral DP/PT.  EKG:  EKG is not ordered today. The ekg ordered today demonstrates  Recent Labs: 12/20/2017: ALT 43; BUN 28; Creatinine, Ser 1.25; Hemoglobin 11.2; Platelets 163; Potassium 4.2; Sodium 137   Lipid  Panel No results found for: CHOL, TRIG, HDL, CHOLHDL, VLDL, LDLCALC, LDLDIRECT   Wt Readings from Last 3 Encounters:  12/31/17 (!) 305 lb 12.8 oz (138.7 kg)  12/20/17 298 lb (135.2 kg)  07/09/17 (!) 301 lb 1.9 oz (136.6 kg)     Other studies Reviewed: Additional studies/ records that were reviewed today include: . Review of the above records demonstrates:    Assessment and Plan:   1. Atrial fibrillation, persistent: He is in atrial fib today. He is not aware of this. Continue beta blocker and Xarelto.   2.HTN: BP is controlled. No changes  3. Chronic diastolic CHF: Weight is up several pounds but he admits to dietary non-compliance. Continue Lasix.   4. Visual changes: Carotid dopplers are planned for tomorrow with results going to Dr. George Ina. It is unlikely that his visual changes are due to a cardiac embolic event. He is on full dose anti-coagulation with Xarelto. This would make a left atrial appendage thrombus less likely. Also, right to left shunting would be unlikely to be the cause of a neurological event since he is already on Xarelto. No new murmurs on exam or fevers to suggest endocarditis. I offered an echo but he does not wish to proceed with this a this time. There is very little to offer from a cardiac standpoint at this time.   5. AAA: This was found on a CTA done 12/20/17 in the ED given c/o abd pain. I will refer him to see [facility] to follow this. It is essentially stable since his last CTA. No indication for surgery currently.   Current medicines are reviewed at length with the patient today.  The patient does not have concerns regarding medicines.  The following changes have been made:  no change  Labs/ tests ordered today include:   Orders Placed This Encounter  Procedures  . Ambulatory referral to Vascular Surgery  Disposition:   FU with me in 6  months  Signed, Lauree Chandler, MD 12/31/2017 4:57 PM    [facility] [place], [place],   [address] Phone: [phone]; Fax: [phone]

## 2018-01-01 ENCOUNTER — Ambulatory Visit
Admission: RE | Admit: 2018-01-01 | Discharge: 2018-01-01 | Disposition: A | Discharge status: Home / Self Care | Payer: Medicare Other | Source: Ambulatory Visit | Attending: Ophthalmology | Admitting: Ophthalmology

## 2018-01-01 DIAGNOSIS — G453 Amaurosis fugax: Secondary | ICD-10-CM | POA: Diagnosis not present

## 2018-01-28 ENCOUNTER — Inpatient Hospital Stay (HOSPITAL_COMMUNITY)
Admission: EM | Admit: 2018-01-28 | Discharge: 2018-02-08 | DRG: 246 | Disposition: A | Discharge status: Skilled Nursing Facility | Payer: Medicare Other | Attending: Family Medicine | Admitting: Family Medicine

## 2018-01-28 ENCOUNTER — Encounter (HOSPITAL_COMMUNITY): Payer: Self-pay | Admitting: Emergency Medicine

## 2018-01-28 ENCOUNTER — Other Ambulatory Visit: Payer: Self-pay

## 2018-01-28 ENCOUNTER — Emergency Department (HOSPITAL_COMMUNITY): Payer: Medicare Other

## 2018-01-28 DIAGNOSIS — I700 Atherosclerosis of aorta: Secondary | ICD-10-CM | POA: Diagnosis present

## 2018-01-28 DIAGNOSIS — I340 Nonrheumatic mitral (valve) insufficiency: Secondary | ICD-10-CM | POA: Diagnosis not present

## 2018-01-28 DIAGNOSIS — Y92230 Patient room in hospital as the place of occurrence of the external cause: Secondary | ICD-10-CM | POA: Diagnosis not present

## 2018-01-28 DIAGNOSIS — J441 Chronic obstructive pulmonary disease with (acute) exacerbation: Secondary | ICD-10-CM | POA: Diagnosis present

## 2018-01-28 DIAGNOSIS — I130 Hypertensive heart and chronic kidney disease with heart failure and stage 1 through stage 4 chronic kidney disease, or unspecified chronic kidney disease: Secondary | ICD-10-CM | POA: Diagnosis present

## 2018-01-28 DIAGNOSIS — Z9114 Patient's other noncompliance with medication regimen: Secondary | ICD-10-CM

## 2018-01-28 DIAGNOSIS — R748 Abnormal levels of other serum enzymes: Secondary | ICD-10-CM | POA: Diagnosis present

## 2018-01-28 DIAGNOSIS — I10 Essential (primary) hypertension: Secondary | ICD-10-CM | POA: Diagnosis not present

## 2018-01-28 DIAGNOSIS — I5043 Acute on chronic combined systolic (congestive) and diastolic (congestive) heart failure: Secondary | ICD-10-CM | POA: Diagnosis present

## 2018-01-28 DIAGNOSIS — G453 Amaurosis fugax: Secondary | ICD-10-CM | POA: Diagnosis present

## 2018-01-28 DIAGNOSIS — Z7901 Long term (current) use of anticoagulants: Secondary | ICD-10-CM

## 2018-01-28 DIAGNOSIS — I5023 Acute on chronic systolic (congestive) heart failure: Secondary | ICD-10-CM | POA: Diagnosis not present

## 2018-01-28 DIAGNOSIS — I481 Persistent atrial fibrillation: Secondary | ICD-10-CM | POA: Diagnosis present

## 2018-01-28 DIAGNOSIS — H9191 Unspecified hearing loss, right ear: Secondary | ICD-10-CM | POA: Diagnosis present

## 2018-01-28 DIAGNOSIS — I2582 Chronic total occlusion of coronary artery: Secondary | ICD-10-CM | POA: Diagnosis present

## 2018-01-28 DIAGNOSIS — H02402 Unspecified ptosis of left eyelid: Secondary | ICD-10-CM | POA: Diagnosis present

## 2018-01-28 DIAGNOSIS — I2721 Secondary pulmonary arterial hypertension: Secondary | ICD-10-CM | POA: Diagnosis not present

## 2018-01-28 DIAGNOSIS — I2781 Cor pulmonale (chronic): Secondary | ICD-10-CM | POA: Diagnosis not present

## 2018-01-28 DIAGNOSIS — Z87891 Personal history of nicotine dependence: Secondary | ICD-10-CM

## 2018-01-28 DIAGNOSIS — N179 Acute kidney failure, unspecified: Secondary | ICD-10-CM | POA: Diagnosis present

## 2018-01-28 DIAGNOSIS — Z9221 Personal history of antineoplastic chemotherapy: Secondary | ICD-10-CM | POA: Diagnosis not present

## 2018-01-28 DIAGNOSIS — I509 Heart failure, unspecified: Secondary | ICD-10-CM | POA: Diagnosis not present

## 2018-01-28 DIAGNOSIS — T502X5A Adverse effect of carbonic-anhydrase inhibitors, benzothiadiazides and other diuretics, initial encounter: Secondary | ICD-10-CM | POA: Diagnosis not present

## 2018-01-28 DIAGNOSIS — L03116 Cellulitis of left lower limb: Secondary | ICD-10-CM | POA: Diagnosis present

## 2018-01-28 DIAGNOSIS — Z96651 Presence of right artificial knee joint: Secondary | ICD-10-CM | POA: Diagnosis present

## 2018-01-28 DIAGNOSIS — N400 Enlarged prostate without lower urinary tract symptoms: Secondary | ICD-10-CM | POA: Diagnosis present

## 2018-01-28 DIAGNOSIS — Z955 Presence of coronary angioplasty implant and graft: Secondary | ICD-10-CM

## 2018-01-28 DIAGNOSIS — E663 Overweight: Secondary | ICD-10-CM | POA: Diagnosis present

## 2018-01-28 DIAGNOSIS — I2729 Other secondary pulmonary hypertension: Secondary | ICD-10-CM | POA: Diagnosis present

## 2018-01-28 DIAGNOSIS — D649 Anemia, unspecified: Secondary | ICD-10-CM | POA: Diagnosis present

## 2018-01-28 DIAGNOSIS — I4891 Unspecified atrial fibrillation: Secondary | ICD-10-CM | POA: Diagnosis present

## 2018-01-28 DIAGNOSIS — I472 Ventricular tachycardia: Secondary | ICD-10-CM | POA: Diagnosis not present

## 2018-01-28 DIAGNOSIS — Z888 Allergy status to other drugs, medicaments and biological substances status: Secondary | ICD-10-CM

## 2018-01-28 DIAGNOSIS — Z8551 Personal history of malignant neoplasm of bladder: Secondary | ICD-10-CM

## 2018-01-28 DIAGNOSIS — N183 Chronic kidney disease, stage 3 (moderate): Secondary | ICD-10-CM | POA: Diagnosis present

## 2018-01-28 DIAGNOSIS — E785 Hyperlipidemia, unspecified: Secondary | ICD-10-CM | POA: Diagnosis present

## 2018-01-28 DIAGNOSIS — Z7951 Long term (current) use of inhaled steroids: Secondary | ICD-10-CM

## 2018-01-28 DIAGNOSIS — Z79899 Other long term (current) drug therapy: Secondary | ICD-10-CM

## 2018-01-28 DIAGNOSIS — I2510 Atherosclerotic heart disease of native coronary artery without angina pectoris: Secondary | ICD-10-CM | POA: Diagnosis present

## 2018-01-28 DIAGNOSIS — I714 Abdominal aortic aneurysm, without rupture: Secondary | ICD-10-CM | POA: Diagnosis present

## 2018-01-28 DIAGNOSIS — Z961 Presence of intraocular lens: Secondary | ICD-10-CM | POA: Diagnosis present

## 2018-01-28 DIAGNOSIS — J9601 Acute respiratory failure with hypoxia: Secondary | ICD-10-CM | POA: Diagnosis not present

## 2018-01-28 DIAGNOSIS — I482 Chronic atrial fibrillation: Secondary | ICD-10-CM | POA: Diagnosis not present

## 2018-01-28 DIAGNOSIS — F419 Anxiety disorder, unspecified: Secondary | ICD-10-CM | POA: Diagnosis not present

## 2018-01-28 DIAGNOSIS — K219 Gastro-esophageal reflux disease without esophagitis: Secondary | ICD-10-CM | POA: Diagnosis present

## 2018-01-28 DIAGNOSIS — Z8249 Family history of ischemic heart disease and other diseases of the circulatory system: Secondary | ICD-10-CM

## 2018-01-28 DIAGNOSIS — R7989 Other specified abnormal findings of blood chemistry: Secondary | ICD-10-CM

## 2018-01-28 DIAGNOSIS — I429 Cardiomyopathy, unspecified: Secondary | ICD-10-CM | POA: Diagnosis present

## 2018-01-28 DIAGNOSIS — Z6841 Body Mass Index (BMI) 40.0 and over, adult: Secondary | ICD-10-CM

## 2018-01-28 DIAGNOSIS — Z9841 Cataract extraction status, right eye: Secondary | ICD-10-CM

## 2018-01-28 DIAGNOSIS — R778 Other specified abnormalities of plasma proteins: Secondary | ICD-10-CM

## 2018-01-28 DIAGNOSIS — E662 Morbid (severe) obesity with alveolar hypoventilation: Secondary | ICD-10-CM | POA: Diagnosis present

## 2018-01-28 DIAGNOSIS — Z9842 Cataract extraction status, left eye: Secondary | ICD-10-CM

## 2018-01-28 DIAGNOSIS — I5042 Chronic combined systolic (congestive) and diastolic (congestive) heart failure: Secondary | ICD-10-CM | POA: Diagnosis present

## 2018-01-28 DIAGNOSIS — I5082 Biventricular heart failure: Secondary | ICD-10-CM | POA: Diagnosis present

## 2018-01-28 DIAGNOSIS — N189 Chronic kidney disease, unspecified: Secondary | ICD-10-CM | POA: Diagnosis not present

## 2018-01-28 DIAGNOSIS — M1990 Unspecified osteoarthritis, unspecified site: Secondary | ICD-10-CM | POA: Diagnosis present

## 2018-01-28 DIAGNOSIS — J449 Chronic obstructive pulmonary disease, unspecified: Secondary | ICD-10-CM | POA: Diagnosis present

## 2018-01-28 HISTORY — DX: Abdominal aortic aneurysm, without rupture: I71.4

## 2018-01-28 HISTORY — DX: Peripheral vascular disease, unspecified: I73.9

## 2018-01-28 HISTORY — DX: Permanent atrial fibrillation: I48.21

## 2018-01-28 HISTORY — DX: Malignant neoplasm of bladder, unspecified: C67.9

## 2018-01-28 HISTORY — DX: Chronic kidney disease, stage 3 unspecified: N18.30

## 2018-01-28 HISTORY — DX: Diaphragmatic hernia without obstruction or gangrene: K44.9

## 2018-01-28 HISTORY — DX: Chronic kidney disease, stage 3 (moderate): N18.3

## 2018-01-28 HISTORY — DX: Disorder of arteries and arterioles, unspecified: I77.9

## 2018-01-28 HISTORY — DX: Abdominal aortic aneurysm, without rupture, unspecified: I71.40

## 2018-01-28 LAB — BASIC METABOLIC PANEL
ANION GAP: 9 (ref 5–15)
BUN: 26 mg/dL — ABNORMAL HIGH (ref 8–23)
CHLORIDE: 109 mmol/L (ref 98–111)
CO2: 26 mmol/L (ref 22–32)
CREATININE: 1.35 mg/dL — AB (ref 0.61–1.24)
Calcium: 9 mg/dL (ref 8.9–10.3)
GFR calc non Af Amer: 48 mL/min — ABNORMAL LOW (ref >60)
GFR, EST AFRICAN AMERICAN: 56 mL/min — AB (ref >60)
Glucose, Bld: 101 mg/dL — ABNORMAL HIGH (ref 70–99)
POTASSIUM: 4.7 mmol/L (ref 3.5–5.1)
SODIUM: 144 mmol/L (ref 135–145)

## 2018-01-28 LAB — CBC
HCT: 36.9 % — ABNORMAL LOW (ref 39.0–52.0)
HEMOGLOBIN: 11.4 g/dL — AB (ref 13.0–17.0)
MCH: 30.3 pg (ref 26.0–34.0)
MCHC: 30.9 g/dL (ref 30.0–36.0)
MCV: 98.1 fL (ref 78.0–100.0)
PLATELETS: 158 10*3/uL (ref 150–400)
RBC: 3.76 MIL/uL — AB (ref 4.22–5.81)
RDW: 16.6 % — ABNORMAL HIGH (ref 11.5–15.5)
WBC: 7.4 10*3/uL (ref 4.0–10.5)

## 2018-01-28 LAB — I-STAT TROPONIN, ED: Troponin i, poc: 0.07 ng/mL (ref 0.00–0.08)

## 2018-01-28 LAB — TROPONIN I: Troponin I: 0.1 ng/mL (ref <0.03)

## 2018-01-28 LAB — BRAIN NATRIURETIC PEPTIDE: B NATRIURETIC PEPTIDE 5: 1523.4 pg/mL — AB (ref 0.0–100.0)

## 2018-01-28 MED ORDER — FUROSEMIDE 10 MG/ML IJ SOLN
40.0000 mg | Freq: Once | INTRAMUSCULAR | Status: AC
  Administered 2018-01-28: 40 mg via INTRAVENOUS
  Filled 2018-01-28: qty 4

## 2018-01-28 MED ORDER — DOXYCYCLINE HYCLATE 100 MG PO TABS
100.0000 mg | ORAL_TABLET | Freq: Once | ORAL | Status: AC
  Administered 2018-01-28: 100 mg via ORAL
  Filled 2018-01-28: qty 1

## 2018-01-28 MED ORDER — BACITRACIN ZINC 500 UNIT/GM EX OINT
1.0000 "application " | TOPICAL_OINTMENT | Freq: Two times a day (BID) | CUTANEOUS | Status: DC
  Administered 2018-01-28: 1 via TOPICAL
  Filled 2018-01-28: qty 0.9

## 2018-01-28 NOTE — ED Notes (Signed)
  Test: Troponin  Critical Value: 0.10  Name of Provider Notified: Dr. Rex Kras

## 2018-01-28 NOTE — H&P (Signed)
History and Physical    John Stark SEG:[id number] DOB: 1935-12-11 DOA: 01/28/2018  PCP: Alroy Dust, L.Marlou Sa, MD   Patient coming from: Home  Chief Complaint: SOB, leg swelling  HPI: John Stark is a 82 y.o. male with medical history significant for CHF, HTN, atrial fibrillation, presented to the ED with complaints of shortness of breath of 2 weeks duration, 10 pound weight gain and worsening bilateral lower extremity swelling.  Patient denies chest pain cough or wheeze.  No fever or chills.  Patient reports compliance with his Xarelto, Lasix and low-salt diet.   ED Course: Stable vitals.  Mildly elevated creatinine 1.3.  Two-view chest x-ray congestive heart failure with new cardiomegaly, pulmonary vascular congestion and small pleural effusions. BNP elevated 1523.  EKG nonspecific T wave changes V4 V5.  Patient was given 40 Lasix.  Hospitalist called to admit for CHF decompensation.  Review of Systems: As per HPI all other systems reviewed and negative.  Past Medical History:  Diagnosis Date  . Asthma    "as a child"  . Atrial fibrillation ([place])    a. Echo 05/27/12:  Mild LVH, EF 55%, aortic sclerosis, no AS, mild LAE, mild RVE, moderate RV dysfunction, mild RAE, PASP 32;  b. Xarelto started 05/22/12  . COPD (chronic obstructive pulmonary disease) ([place])   . Dysrhythmia    a fib  . Edema    FEET/LEGS  . Enlarged prostate   . GERD (gastroesophageal reflux disease)   . Hearing loss in right ear   . HLD (hyperlipidemia)   . HTN (hypertension)   . Neuropathy   . Osteoarthrosis, unspecified whether generalized or localized, unspecified site   . Overweight(278.02)   . Right knee pain    awaiting knee replacement  . Shortness of breath dyspnea   . Wheezing     Past Surgical History:  Procedure Laterality Date  . BACK SURGERY     2011  . CARDIOVERSION  06/18/2012   Procedure: CARDIOVERSION;  Surgeon: Josue Hector, MD;  Location: [facility] ENDOSCOPY;  Service: Cardiovascular;   Laterality: N/A;  . CATARACT EXTRACTION W/PHACO Right 09/07/2016   Procedure: CATARACT EXTRACTION PHACO AND INTRAOCULAR LENS PLACEMENT ([place]);  Surgeon: Eulogio Bear, MD;  Location: [facility] ORS;  Service: Ophthalmology;  Laterality: Right;  [place] 42.9 AP% 10.0 CDE 4.52 Fluid pack lot # [id number]  . CATARACT EXTRACTION W/PHACO Left 10/12/2016   Procedure: CATARACT EXTRACTION PHACO AND INTRAOCULAR LENS PLACEMENT (I[facility]);  Surgeon: Eulogio Bear, MD;  Location: [facility] ORS;  Service: Ophthalmology;  Laterality: Left;  [place] 34.2 AP% 9.0 CDE 3.17 Fluid pack lot # [id number]  . COLONOSCOPY    . JOINT REPLACEMENT    . SPINAL CORD STIMULATOR INSERTION N/A 02/18/2016   Procedure: LUMBAR SPINAL CORD STIMULATOR INSERTION;  Surgeon: Clydell Hakim, MD;  Location: [place] NEURO ORS;  Service: Neurosurgery;  Laterality: N/A;  LUMBAR SPINAL CORD STIMULATOR INSERTION  . TOTAL KNEE ARTHROPLASTY Right 08/07/2012   Procedure: TOTAL KNEE ARTHROPLASTY;  Surgeon: Kerin Salen, MD;  Location: [place];  Service: Orthopedics;  Laterality: Right;  . TRANSURETHRAL RESECTION OF BLADDER TUMOR N/A 05/04/2017   Procedure: TRANSURETHRAL RESECTION OF BLADDER TUMOR (TURBT)/ INTRAVESICAL CHEMOTHERAPY INSTILLATION;  Surgeon: Kathie Rhodes, MD;  Location: [facility] ORS;  Service: Urology;  Laterality: N/A;     reports that he quit smoking about 40 years ago. His smoking use included cigarettes. He has a 20.00 pack-year smoking history. He has never used smokeless tobacco. He reports that he does not drink  alcohol or use drugs.  Allergies  Allergen Reactions  . Crestor [Rosuvastatin Calcium] Other (See Comments)    Muscle aches   . Pravastatin Other (See Comments)    Muscle aches   . Simvastatin Other (See Comments)    Muscle aches     Family History  Problem Relation Age of Onset  . Cancer Other   . Heart attack Brother 75    Prior to Admission medications   Medication Sig Start Date End Date Taking? Authorizing Provider  amLODipine  (NORVASC) 5 MG tablet Take 5 mg by mouth daily.    Yes [provider]  benazepril (LOTENSIN) 10 MG tablet Take 20 mg by mouth at bedtime.    Yes [provider]  celecoxib (CELEBREX) 200 MG capsule Take 200 mg by mouth daily as needed for mild pain.    Yes [provider]  doxazosin (CARDURA) 4 MG tablet Take 4 mg by mouth at bedtime.    Yes [provider]  famotidine (PEPCID) 20 MG tablet Take 1 tablet (20 mg total) by mouth daily. 08/03/17  Yes Burnell Blanks, MD  finasteride (PROSCAR) 5 MG tablet Take 5 mg by mouth at bedtime.    Yes [provider]  Fluticasone-Salmeterol (ADVAIR) 250-50 MCG/DOSE AEPB Inhale 1 puff into the lungs 2 (two) times daily.   Yes [provider]  furosemide (LASIX) 40 MG tablet Take 40 mg by mouth daily.   Yes [provider]  meclizine (ANTIVERT) 25 MG tablet Take 25 mg by mouth 3 (three) times daily as needed for dizziness.   Yes [provider]  metoprolol tartrate (LOPRESSOR) 50 MG tablet Take 1 tablet (50 mg total) by mouth 2 (two) times daily. 08/03/17  Yes Burnell Blanks, MD  Nutritional Supplements (JUICE PLUS FIBRE PO) Take 6 tablets by mouth daily. 2 nuts tablets, 2 fruit tablets, and 2 veggie tablets   Yes [provider]  potassium chloride (MICRO-K) 10 MEQ CR capsule Take 10 mEq by mouth daily.    Yes [provider]  PROAIR HFA 108 (90 Base) MCG/ACT inhaler Inhale 2 puffs into the lungs every 4 (four) hours as needed for shortness of breath or wheezing.  04/20/16  Yes [provider]  vitamin B-12 (CYANOCOBALAMIN) 100 MCG tablet Take 100 mcg by mouth daily.   Yes [provider]  [facility] 625 MG tablet Take 1,250 mg by mouth 2 (two) times daily with a meal.  07/16/13  Yes [provider]  XARELTO 20 MG TABS tablet TAKE 1 TABLET BY MOUTH ONCE DAILY 07/09/17  Yes Burnell Blanks, MD  cyclobenzaprine (FLEXERIL) 5 MG tablet  Take 1 tablet (5 mg total) by mouth at bedtime and may repeat dose one time if needed. Patient not taking: Reported on 01/28/2018 12/20/17   Recardo Evangelist, PA-C    Physical Exam: Vitals:   01/28/18 2023 01/28/18 2038 01/28/18 2100 01/28/18 2130  BP: (!) 137/96  134/84 (!) 133/92  Pulse: 93  (!) 102 90  Resp: (!) 27  16 20   Temp:      TempSrc:      SpO2: 93%  93% (!) 89%  Weight:  (!) 140.6 kg (310 lb)    Height:  5\' 11"  (1.803 m)      Constitutional: NAD, calm, comfortable Vitals:   01/28/18 2023 01/28/18 2038 01/28/18 2100 01/28/18 2130  BP: (!) 137/96  134/84 (!) 133/92  Pulse: 93  (!) 102 90  Resp: Marland Kitchen)  27  16 20   Temp:      TempSrc:      SpO2: 93%  93% (!) 89%  Weight:  (!) 140.6 kg (310 lb)    Height:  5\' 11"  (1.803 m)     Eyes: PERRL, lids and conjunctivae normal ENMT: Mucous membranes are moist. Posterior pharynx clear of any exudate or lesions.Normal dentition.  Neck: normal, supple, no masses, no thyromegaly Respiratory: clear to auscultation bilaterally, no wheezing, no crackles. Normal respiratory effort. No accessory muscle use.  Cardiovascular: Irregular rate and rhythm, no murmurs / rubs / gallops. 2+ bilateral pitting extremity edema. 2+ pedal pulses. No carotid bruits.  Abdomen: no tenderness, no masses palpated. No hepatosplenomegaly. Bowel sounds positive.  Musculoskeletal: no clubbing / cyanosis. No joint deformity upper and lower extremities. Good ROM, no contractures. Normal muscle tone.  Skin: healing wound anterio left leg, with mild surrounding erythema, without differential warmth  Neurologic: CN 2-12 grossly intact.  Strength 5/5 in all 4.  Psychiatric: Normal judgment and insight. Alert and oriented x 3. Normal mood.   Labs on Admission: I have personally reviewed following labs and imaging studies  CBC: Recent Labs  Lab 01/28/18 1611  WBC 7.4  HGB 11.4*  HCT 36.9*  MCV 98.1  PLT 378   Basic Metabolic Panel: Recent Labs  Lab  01/28/18 1611  NA 144  K 4.7  CL 109  CO2 26  GLUCOSE 101*  BUN 26*  CREATININE 1.35*  CALCIUM 9.0   Cardiac Enzymes: Recent Labs  Lab 01/28/18 2039  TROPONINI 0.10*    Radiological Exams on Admission: Dg Chest 2 View  Result Date: 01/28/2018 CLINICAL DATA:  Shortness of breath.  History of COPD. EXAM: CHEST - 2 VIEW COMPARISON:  Chest x-ray dated 05/21/2012 and abdominal CT scan dated 12/20/2017 FINDINGS: There is new cardiomegaly with small bilateral pleural effusions. There is slight new pulmonary vascular prominence. Bilateral calcified granulomas. Tortuosity and calcification of the thoracic aorta. Neurostimulator wires in the midthoracic spine. No significant bone abnormality. Moderate hiatal hernia IMPRESSION: Findings consistent with congestive heart failure with new cardiomegaly. Pulmonary vascular congestion, and small pleural effusions. No discrete pulmonary edema. Electronically Signed   By: Lorriane Shire M.D.   On: 01/28/2018 16:33   EKG: Independently reviewed. Irregular rhythm.  Nonspecific T wave changes V4 V5.  Assessment/Plan Principal Problem:   Acute exacerbation of CHF (congestive heart failure) (HCC) Active Problems:   Overweight   Essential hypertension   DIASTOLIC HEART FAILURE, CHRONIC   Atrial fibrillation (HCC)   Acute exacerbation of CHF- BNP- 1523. 2+ bilateral pitting edema. two-view chest x-ray-luminary vascular congestion and small pleural effusions, new cardiomegaly. Trop x1 - negative. EKG-nonspecific T wave changes in V4 V5.  Last echo 2013-EF 55% mild LVH. -Echocardiogram -Trops X 2 -IV Lasix 40 twice daily -Daily weights, input output, fluid restriction -Continue home K supplementation -BMP a.m.  Atrial fibrillation-rate controlled, compliant with anticoagulation. -Continue Xarelto  HTN-stable. -Continue metoprolol 50 twice daily, lisinopril, Norvasc  COPD-stable. -Continue home inhalers  DVT prophylaxis: Xalrelto Code Status:  Full- confirmed with patient at bedside Family Communication: none at bedside Disposition Plan: per rounding team, Consults called: None Admission status: inpt, tele   Bethena Roys MD [facility] Pager [phone] From 6PM-2AM.  Otherwise please contact night-coverage [email or web address] Password [email or web address]  01/28/2018, 11:45 PM

## 2018-01-28 NOTE — ED Provider Notes (Signed)
[place] EMERGENCY DEPARTMENT Provider Note   CSN: [id number] Arrival date & time: 01/28/18  1553     History   Chief Complaint Chief Complaint  Patient presents with  . Shortness of Breath    HPI SOL ENGLERT is a 82 y.o. male.  82yo M w/ PMH including A fib, COPD, HTN, HLD who p/w shortness of breath.  The patient states that he has had 2 weeks of persistent shortness of breath that is worse with exertion.  He notes a recent 10 pound weight gain.  No associated chest pain, cough, or fever.  He has some lower extremity edema.  He denies any orthopnea or paroxysmal nocturnal dyspnea.  He was given claritin recently which helped with post-nasal drip but he continues to have SOB. He takes 40 mg Lasix daily, no recent changes to his medications. He is compliant with meds.  The history is provided by the patient.  Shortness of Breath     Past Medical History:  Diagnosis Date  . Asthma    "as a child"  . Atrial fibrillation ([place])    a. Echo 05/27/12:  Mild LVH, EF 55%, aortic sclerosis, no AS, mild LAE, mild RVE, moderate RV dysfunction, mild RAE, PASP 32;  b. Xarelto started 05/22/12  . COPD (chronic obstructive pulmonary disease) ([place])   . Dysrhythmia    a fib  . Edema    FEET/LEGS  . Enlarged prostate   . GERD (gastroesophageal reflux disease)   . Hearing loss in right ear   . HLD (hyperlipidemia)   . HTN (hypertension)   . Neuropathy   . Osteoarthrosis, unspecified whether generalized or localized, unspecified site   . Overweight(278.02)   . Right knee pain    awaiting knee replacement  . Shortness of breath dyspnea   . Wheezing     Patient Active Problem List   Diagnosis Date Noted  . Acute exacerbation of CHF (congestive heart failure) ([place]) 01/28/2018  . Osteoarthritis of right knee 08/07/2012  . Atrial fibrillation ([place]) 06/05/2012  . DIASTOLIC HEART FAILURE, CHRONIC 06/15/2009  . HYPERLIPIDEMIA-MIXED 12/04/2008  .  OVERWEIGHT/OBESITY 12/04/2008  . HYPERTENSION, UNSPECIFIED 12/04/2008  . GERD 12/04/2008  . OSTEOARTHRITIS 12/04/2008  . PALPITATIONS 12/04/2008    Past Surgical History:  Procedure Laterality Date  . BACK SURGERY     2011  . CARDIOVERSION  06/18/2012   Procedure: CARDIOVERSION;  Surgeon: Josue Hector, MD;  Location: [facility] ENDOSCOPY;  Service: Cardiovascular;  Laterality: N/A;  . CATARACT EXTRACTION W/PHACO Right 09/07/2016   Procedure: CATARACT EXTRACTION PHACO AND INTRAOCULAR LENS PLACEMENT ([place]);  Surgeon: Eulogio Bear, MD;  Location: [facility] ORS;  Service: Ophthalmology;  Laterality: Right;  [place] 42.9 AP% 10.0 CDE 4.52 Fluid pack lot # [id number]  . CATARACT EXTRACTION W/PHACO Left 10/12/2016   Procedure: CATARACT EXTRACTION PHACO AND INTRAOCULAR LENS PLACEMENT (IOC);  Surgeon: Eulogio Bear, MD;  Location: [facility] ORS;  Service: Ophthalmology;  Laterality: Left;  [place] 34.2 AP% 9.0 CDE 3.17 Fluid pack lot # [id number]  . COLONOSCOPY    . JOINT REPLACEMENT    . SPINAL CORD STIMULATOR INSERTION N/A 02/18/2016   Procedure: LUMBAR SPINAL CORD STIMULATOR INSERTION;  Surgeon: Clydell Hakim, MD;  Location: [facility] NEURO ORS;  Service: Neurosurgery;  Laterality: N/A;  LUMBAR SPINAL CORD STIMULATOR INSERTION  . TOTAL KNEE ARTHROPLASTY Right 08/07/2012   Procedure: TOTAL KNEE ARTHROPLASTY;  Surgeon: Kerin Salen, MD;  Location: [place];  Service: Orthopedics;  Laterality: Right;  .  TRANSURETHRAL RESECTION OF BLADDER TUMOR N/A 05/04/2017   Procedure: TRANSURETHRAL RESECTION OF BLADDER TUMOR (TURBT)/ INTRAVESICAL CHEMOTHERAPY INSTILLATION;  Surgeon: Kathie Rhodes, MD;  Location: [facility] ORS;  Service: Urology;  Laterality: N/A;        Home Medications    Prior to Admission medications   Medication Sig Start Date End Date Taking? Authorizing Provider  amLODipine (NORVASC) 5 MG tablet Take 5 mg by mouth daily.    Yes [provider]  benazepril (LOTENSIN) 10 MG tablet Take 20 mg by mouth at bedtime.     Yes [provider]  celecoxib (CELEBREX) 200 MG capsule Take 200 mg by mouth daily as needed for mild pain.    Yes [provider]  doxazosin (CARDURA) 4 MG tablet Take 4 mg by mouth at bedtime.    Yes [provider]  famotidine (PEPCID) 20 MG tablet Take 1 tablet (20 mg total) by mouth daily. 08/03/17  Yes Burnell Blanks, MD  finasteride (PROSCAR) 5 MG tablet Take 5 mg by mouth at bedtime.    Yes [provider]  Fluticasone-Salmeterol (ADVAIR) 250-50 MCG/DOSE AEPB Inhale 1 puff into the lungs 2 (two) times daily.   Yes [provider]  furosemide (LASIX) 40 MG tablet Take 40 mg by mouth daily.   Yes [provider]  meclizine (ANTIVERT) 25 MG tablet Take 25 mg by mouth 3 (three) times daily as needed for dizziness.   Yes [provider]  metoprolol tartrate (LOPRESSOR) 50 MG tablet Take 1 tablet (50 mg total) by mouth 2 (two) times daily. 08/03/17  Yes Burnell Blanks, MD  Nutritional Supplements (JUICE PLUS FIBRE PO) Take 6 tablets by mouth daily. 2 nuts tablets, 2 fruit tablets, and 2 veggie tablets   Yes [provider]  potassium chloride (MICRO-K) 10 MEQ CR capsule Take 10 mEq by mouth daily.    Yes [provider]  PROAIR HFA 108 (90 Base) MCG/ACT inhaler Inhale 2 puffs into the lungs every 4 (four) hours as needed for shortness of breath or wheezing.  04/20/16  Yes [provider]  vitamin B-12 (CYANOCOBALAMIN) 100 MCG tablet Take 100 mcg by mouth daily.   Yes [provider]  [facility] 625 MG tablet Take 1,250 mg by mouth 2 (two) times daily with a meal.  07/16/13  Yes [provider]  XARELTO 20 MG TABS tablet TAKE 1 TABLET BY MOUTH ONCE DAILY 07/09/17  Yes Burnell Blanks, MD  cyclobenzaprine (FLEXERIL) 5 MG tablet Take 1 tablet (5 mg total) by mouth at bedtime and may repeat dose one time if needed. Patient not taking: Reported on 01/28/2018 12/20/17   Recardo Evangelist, PA-C    Family History Family History  Problem Relation Age of Onset  . Cancer Other   . Heart attack Brother 48    Social History Social History   Tobacco Use  . Smoking status: Former Smoker    Packs/day: 1.00    Years: 20.00    Pack years: 20.00    Types: Cigarettes    Last attempt to quit: 05/22/1977    Years since quitting: 40.7  . Smokeless tobacco: Never Used  . Tobacco comment: Quit 1978  Substance Use Topics  . Alcohol use: No  . Drug use: No     Allergies   Crestor [rosuvastatin calcium]; Pravastatin; and Simvastatin   Review of Systems Review of Systems  Respiratory: Positive for shortness of breath.    All other systems reviewed  and are negative except that which was mentioned in HPI   Physical Exam Updated Vital Signs BP (!) 133/92   Pulse 90   Temp (!) 97.5 F (36.4 C) (Oral)   Resp 20   Ht 5\' 11"  (1.803 m)   Wt (!) 140.6 kg (310 lb)   SpO2 (!) 89%   BMI 43.24 kg/m   Physical Exam  Constitutional: He is oriented to person, place, and time. He appears well-developed and well-nourished. No distress.  HENT:  Head: Normocephalic and atraumatic.  Moist mucous membranes  Eyes: Pupils are equal, round, and reactive to light. Conjunctivae are normal.  Neck: Neck supple.  Cardiovascular: Normal rate, regular rhythm and normal heart sounds.  No murmur heard. Pulmonary/Chest:  Very mildly increased WOB without respiratory distress; diminished BS b/l; no wheezes  Abdominal: Soft. Bowel sounds are normal. He exhibits no distension. There is no tenderness.  Musculoskeletal:       Right lower leg: He exhibits edema.       Left lower leg: He exhibits edema.  2+ pitting edema BLE  Neurological: He is alert and oriented to person, place, and time.  Fluent speech  Skin: Skin is warm and dry.  Small ulcerative wound on L lower leg with small area of surrounding erythema  Psychiatric: He has a normal mood and affect. Judgment normal.    Nursing note and vitals reviewed.    ED Treatments / Results  Labs (all labs ordered are listed, but only abnormal results are displayed) Labs Reviewed  BASIC METABOLIC PANEL - Abnormal; Notable for the following components:      Result Value   Glucose, Bld 101 (*)    BUN 26 (*)    Creatinine, Ser 1.35 (*)    GFR calc non Af Amer 48 (*)    GFR calc Af Amer 56 (*)    All other components within normal limits  CBC - Abnormal; Notable for the following components:   RBC 3.76 (*)    Hemoglobin 11.4 (*)    HCT 36.9 (*)    RDW 16.6 (*)    All other components within normal limits  BRAIN NATRIURETIC PEPTIDE - Abnormal; Notable for the following components:   B Natriuretic Peptide 1,523.4 (*)    All other components within normal limits  TROPONIN I - Abnormal; Notable for the following components:   Troponin I 0.10 (*)    All other components within normal limits  I-STAT TROPONIN, ED    EKG EKG Interpretation  Date/Time:  Monday January 28 2018 15:57:11 EDT Ventricular Rate:  97 PR Interval:    QRS Duration: 104 QT Interval:  358 QTC Calculation: 454 R Axis:   13 Text Interpretation:  Atrial fibrillation Low voltage QRS Incomplete right bundle branch block Inferior infarct , age undetermined Cannot rule out Anterior infarct , age undetermined Abnormal ECG similar to previous Confirmed by Theotis Burrow [phone]) on 01/28/2018 8:08:40 PM   Radiology Dg Chest 2 View  Result Date: 01/28/2018 CLINICAL DATA:  Shortness of breath.  History of COPD. EXAM: CHEST - 2 VIEW COMPARISON:  Chest x-ray dated 05/21/2012 and abdominal CT scan dated 12/20/2017 FINDINGS: There is new cardiomegaly with small bilateral pleural effusions. There is slight new pulmonary vascular prominence. Bilateral calcified granulomas. Tortuosity and calcification of the thoracic aorta. Neurostimulator wires in the midthoracic spine. No significant bone abnormality. Moderate hiatal hernia IMPRESSION: Findings  consistent with congestive heart failure with new cardiomegaly. Pulmonary vascular congestion, and small pleural effusions. No  discrete pulmonary edema. Electronically Signed   By: Lorriane Shire M.D.   On: 01/28/2018 16:33    Procedures .Critical Care Performed by: Sharlett Iles, MD Authorized by: Sharlett Iles, MD   Critical care provider statement:    Critical care time (minutes):  30   Critical care time was exclusive of:  Separately billable procedures and treating other patients   Critical care was necessary to treat or prevent imminent or life-threatening deterioration of the following conditions:  Cardiac failure   Critical care was time spent personally by me on the following activities:  Development of treatment plan with patient or surrogate, evaluation of patient's response to treatment, examination of patient, obtaining history from patient or surrogate, ordering and performing treatments and interventions, ordering and review of radiographic studies, ordering and review of laboratory studies, re-evaluation of patient's condition and review of old charts   (including critical care time)  Medications Ordered in ED Medications  bacitracin ointment 1 application (1 application Topical Given 01/28/18 2102)  furosemide (LASIX) injection 40 mg (40 mg Intravenous Given 01/28/18 2102)  doxycycline (VIBRA-TABS) tablet 100 mg (100 mg Oral Given 01/28/18 2102)     Initial Impression / Assessment and Plan / ED Course  I have reviewed the triage vital signs and the nursing notes.  Pertinent labs & imaging results that were available during my care of the patient were reviewed by me and considered in my medical decision making (see chart for details).     Pt with mild dyspnea but no respiratory distress on exam.  O2 sat was mid 90s on room air, mild increased work of breathing.  Daughter notes that he can barely take 2 steps without getting severely dyspneic.  Chest x-ray is  suggestive of CHF exacerbation with pulmonary vascular congestion and small pleural effusions.  Creatinine 1.35, BNP 1523, troponin I 0.1 which I suspect is secondary to demand ischemia from CHF exacerbation. Given severity of symptoms, recommended admission for diuresis and further w/u of heart failure. Gave 40mg  IV lasix in ED. Of note, he had a small wound on L lower leg with mild erythema, will treat for early cellulitis. Applied bacitracin and gave doxycycline.   Discussed admission with [facility], Dr. Denton Brick, and pt admitted for further diuresis.   Final Clinical Impressions(s) / ED Diagnoses   Final diagnoses:  Congestive heart failure, unspecified HF chronicity, unspecified heart failure type ([place])  Cellulitis of left lower leg    ED Discharge Orders    None       Little, Wenda Overland, MD 01/28/18 2356

## 2018-01-28 NOTE — ED Triage Notes (Signed)
Pt c/o shortness of breath x 2 weeks. Shortness of breath worse with exertion. Hx COPD/Afib

## 2018-01-29 ENCOUNTER — Inpatient Hospital Stay (HOSPITAL_COMMUNITY): Payer: Medicare Other

## 2018-01-29 DIAGNOSIS — I340 Nonrheumatic mitral (valve) insufficiency: Secondary | ICD-10-CM

## 2018-01-29 DIAGNOSIS — J449 Chronic obstructive pulmonary disease, unspecified: Secondary | ICD-10-CM | POA: Diagnosis present

## 2018-01-29 LAB — BASIC METABOLIC PANEL
ANION GAP: 12 (ref 5–15)
BUN: 24 mg/dL — ABNORMAL HIGH (ref 8–23)
CHLORIDE: 107 mmol/L (ref 98–111)
CO2: 24 mmol/L (ref 22–32)
Calcium: 8.7 mg/dL — ABNORMAL LOW (ref 8.9–10.3)
Creatinine, Ser: 1.34 mg/dL — ABNORMAL HIGH (ref 0.61–1.24)
GFR calc Af Amer: 56 mL/min — ABNORMAL LOW (ref >60)
GFR calc non Af Amer: 48 mL/min — ABNORMAL LOW (ref >60)
Glucose, Bld: 103 mg/dL — ABNORMAL HIGH (ref 70–99)
Potassium: 4 mmol/L (ref 3.5–5.1)
Sodium: 143 mmol/L (ref 135–145)

## 2018-01-29 LAB — TROPONIN I
TROPONIN I: 0.09 ng/mL — AB (ref <0.03)
TROPONIN I: 0.09 ng/mL — AB (ref <0.03)

## 2018-01-29 LAB — ECHOCARDIOGRAM COMPLETE
HEIGHTINCHES: 71 in
Weight: 4872 oz

## 2018-01-29 MED ORDER — MECLIZINE HCL 25 MG PO TABS: 25.0000 mg | ORAL_TABLET | Freq: Three times a day (TID) | ORAL | Status: DC | PRN

## 2018-01-29 MED ORDER — AMLODIPINE BESYLATE 5 MG PO TABS
5.0000 mg | ORAL_TABLET | Freq: Every day | ORAL | Status: DC
  Administered 2018-01-29: 5 mg via ORAL
  Filled 2018-01-29: qty 1

## 2018-01-29 MED ORDER — ONDANSETRON HCL 4 MG PO TABS: 4.0000 mg | ORAL_TABLET | Freq: Four times a day (QID) | ORAL | Status: DC | PRN

## 2018-01-29 MED ORDER — COLESEVELAM HCL 625 MG PO TABS
1250.0000 mg | ORAL_TABLET | Freq: Two times a day (BID) | ORAL | Status: DC
  Administered 2018-01-29 (×2): 1250 mg via ORAL
  Filled 2018-01-29 (×3): qty 2

## 2018-01-29 MED ORDER — POLYETHYLENE GLYCOL 3350 17 G PO PACK: 17.0000 g | PACK | Freq: Every day | ORAL | Status: DC | PRN

## 2018-01-29 MED ORDER — METHYLPREDNISOLONE SODIUM SUCC 40 MG IJ SOLR
40.0000 mg | Freq: Two times a day (BID) | INTRAMUSCULAR | Status: DC
  Administered 2018-01-29 (×2): 40 mg via INTRAVENOUS
  Filled 2018-01-29 (×2): qty 1

## 2018-01-29 MED ORDER — PERFLUTREN LIPID MICROSPHERE
INTRAVENOUS | Status: AC
  Administered 2018-01-29: 5 mL via INTRAVENOUS
  Filled 2018-01-29: qty 10

## 2018-01-29 MED ORDER — ACETAMINOPHEN 325 MG PO TABS: 650.0000 mg | ORAL_TABLET | Freq: Four times a day (QID) | ORAL | Status: DC | PRN

## 2018-01-29 MED ORDER — ACETAMINOPHEN 650 MG RE SUPP: 650.0000 mg | Freq: Four times a day (QID) | RECTAL | Status: DC | PRN

## 2018-01-29 MED ORDER — METHYLPREDNISOLONE SODIUM SUCC 40 MG IJ SOLR
40.0000 mg | Freq: Every day | INTRAMUSCULAR | Status: DC
Start: 2018-01-29 — End: 2018-01-29

## 2018-01-29 MED ORDER — FUROSEMIDE 10 MG/ML IJ SOLN
40.0000 mg | Freq: Two times a day (BID) | INTRAMUSCULAR | Status: DC
  Administered 2018-01-29 – 2018-01-30 (×4): 40 mg via INTRAVENOUS
  Filled 2018-01-29 (×4): qty 4

## 2018-01-29 MED ORDER — FAMOTIDINE 20 MG PO TABS
20.0000 mg | ORAL_TABLET | Freq: Every day | ORAL | Status: DC
  Administered 2018-01-29 – 2018-02-08 (×11): 20 mg via ORAL
  Filled 2018-01-29 (×11): qty 1

## 2018-01-29 MED ORDER — RIVAROXABAN 20 MG PO TABS
20.0000 mg | ORAL_TABLET | Freq: Every day | ORAL | Status: DC
  Administered 2018-01-29: 20 mg via ORAL
  Filled 2018-01-29: qty 1

## 2018-01-29 MED ORDER — IPRATROPIUM-ALBUTEROL 0.5-2.5 (3) MG/3ML IN SOLN
3.0000 mL | Freq: Four times a day (QID) | RESPIRATORY_TRACT | Status: DC
  Administered 2018-01-29 – 2018-01-30 (×5): 3 mL via RESPIRATORY_TRACT
  Filled 2018-01-29 (×6): qty 3

## 2018-01-29 MED ORDER — ONDANSETRON HCL 4 MG/2ML IJ SOLN: 4.0000 mg | Freq: Four times a day (QID) | INTRAMUSCULAR | Status: DC | PRN

## 2018-01-29 MED ORDER — DOXAZOSIN MESYLATE 4 MG PO TABS
4.0000 mg | ORAL_TABLET | Freq: Every day | ORAL | Status: DC
  Administered 2018-01-29 – 2018-02-07 (×11): 4 mg via ORAL
  Filled 2018-01-29 (×12): qty 1

## 2018-01-29 MED ORDER — PERFLUTREN LIPID MICROSPHERE
1.0000 mL | INTRAVENOUS | Status: AC | PRN
Start: 2018-01-29 — End: 2018-01-29
  Administered 2018-01-29: 5 mL via INTRAVENOUS
  Filled 2018-01-29: qty 10

## 2018-01-29 MED ORDER — BENAZEPRIL HCL 20 MG PO TABS
20.0000 mg | ORAL_TABLET | Freq: Every day | ORAL | Status: DC
  Administered 2018-01-29 – 2018-01-30 (×2): 20 mg via ORAL
  Filled 2018-01-29 (×3): qty 1

## 2018-01-29 MED ORDER — MOMETASONE FURO-FORMOTEROL FUM 200-5 MCG/ACT IN AERO
2.0000 | INHALATION_SPRAY | Freq: Two times a day (BID) | RESPIRATORY_TRACT | Status: DC
  Administered 2018-01-29 – 2018-02-08 (×21): 2 via RESPIRATORY_TRACT
  Filled 2018-01-29: qty 8.8

## 2018-01-29 MED ORDER — FINASTERIDE 5 MG PO TABS
5.0000 mg | ORAL_TABLET | Freq: Every day | ORAL | Status: DC
  Administered 2018-01-29 – 2018-02-07 (×11): 5 mg via ORAL
  Filled 2018-01-29 (×11): qty 1

## 2018-01-29 MED ORDER — METOPROLOL TARTRATE 50 MG PO TABS
50.0000 mg | ORAL_TABLET | Freq: Two times a day (BID) | ORAL | Status: DC
  Administered 2018-01-29 – 2018-02-03 (×12): 50 mg via ORAL
  Filled 2018-01-29 (×12): qty 1

## 2018-01-29 MED ORDER — POTASSIUM CHLORIDE CRYS ER 10 MEQ PO TBCR
10.0000 meq | EXTENDED_RELEASE_TABLET | Freq: Every day | ORAL | Status: DC
  Administered 2018-01-29 – 2018-01-30 (×2): 10 meq via ORAL
  Filled 2018-01-29 (×2): qty 1

## 2018-01-29 NOTE — Progress Notes (Signed)
Patient arrived to unit, patient stood for standing weight and the effort it took to stand and get on scale caused patient to become [facility], Rn gave patient oxygen (O2 sats were at 96%)- patient did not want to use oxygen and finally caught his breath. RN advised patient that oxygen will be beside him and to ut it on as needed.  Cardiac monitoring initiated and verified, safety precautions in place, patient updated on plan of care.

## 2018-01-29 NOTE — Progress Notes (Addendum)
PROGRESS NOTE    John Stark  ZOX:[id number] DOB: 07-05-1935 DOA: 01/28/2018 PCP: Alroy Dust, L.Marlou Sa, MD    Brief Narrative: John Stark is a 82 y.o. male with medical history significant for CHF, HTN, atrial fibrillation, presented to the ED with complaints of shortness of breath of 2 weeks duration, 10 pound weight gain and worsening bilateral lower extremity swelling.  Patient denies chest pain cough or wheeze.  No fever or chills.  Patient reports compliance with his Xarelto, Lasix and low-salt diet.   ED Course: Stable vitals.  Mildly elevated creatinine 1.3.  Two-view chest x-ray congestive heart failure with new cardiomegaly, pulmonary vascular congestion and small pleural effusions. BNP elevated 1523.  EKG nonspecific T wave changes V4 V5.  Patient was given 40 Lasix.  Hospitalist called to admit for CHF decompensation.  Patient admitted with acute heart failure exacerbation.   Assessment & Plan:   Principal Problem:   Acute exacerbation of CHF (congestive heart failure) (HCC) Active Problems:   Overweight   Essential hypertension   DIASTOLIC HEART FAILURE, CHRONIC   Atrial fibrillation (HCC)   COPD (chronic obstructive pulmonary disease) (HCC)  1-Acute on chronic Diastolic Heart failure exacerbation;  Presents with dyspnea, wheezing lung exam, weight gain.  Last echo 2013-EF 55% mild LVH Weight 310---304. He report his dry weight is 290 ?  Continue with lasix.   2-Acute COPD exacerbation;  He has bilateral expiratory wheezing. He report relieves with nebulizer.  IV solumedrol.  Schedule nebulizer treatment.   3-A fib;  Continue with xarelto.   4-HTN;  Continue with metoprolol, benazepril/  Hold Norvasc to avoid hypotension.    DVT prophylaxis: Xarelto.  Code Status: Full code.  Family Communication: care discussed with patient.  Disposition Plan: Remain inpatient.    Consultants:   none   Procedures:   ECHO  Antimicrobials:  Doxy.    Subjective: He is breathing better, but not at baseline. He relates nebulizer treatments help.  He report cough.   Objective: Vitals:   01/29/18 0037 01/29/18 0430 01/29/18 0740 01/29/18 0840  BP: (!) 147/95 (!) 138/97  (!) 145/84  Pulse: 100 89  70  Resp: 18 18  20   Temp: 98.2 F (36.8 C) (!) 97.5 F (36.4 C)  97.7 F (36.5 C)  TempSrc: Oral Oral  Oral  SpO2: 95% 93% 94% 92%  Weight: (!) 138.1 kg (304 lb 8 oz)     Height: 5\' 11"  (1.803 m)       Intake/Output Summary (Last 24 hours) at 01/29/2018 1017 Last data filed at 01/29/2018 0933 Gross per 24 hour  Intake 480 ml  Output 2425 ml  Net -1945 ml   Filed Weights   01/28/18 2038 01/29/18 0037  Weight: (!) 140.6 kg (310 lb) (!) 138.1 kg (304 lb 8 oz)    Examination:  General exam: Appears calm and comfortable  Respiratory system: Bilateral decrease breath sound, bilateral wheezing.  Cardiovascular system: S1 & S2 heard, RRR. No JVD, murmurs, rubs, gallops or clicks. No pedal edema. Gastrointestinal system: Abdomen is nondistended, soft and nontender. No organomegaly or masses felt. Normal bowel sounds heard. Central nervous system: Alert and oriented. No focal neurological deficits. Extremities: Symmetric 5 x 5 power. Skin: No rashes, lesions or ulcers   Data Reviewed: I have personally reviewed following labs and imaging studies  CBC: Recent Labs  Lab 01/28/18 1611  WBC 7.4  HGB 11.4*  HCT 36.9*  MCV 98.1  PLT 811   Basic Metabolic Panel: Recent  Labs  Lab 01/28/18 1611 01/29/18 0419  NA 144 143  K 4.7 4.0  CL 109 107  CO2 26 24  GLUCOSE 101* 103*  BUN 26* 24*  CREATININE 1.35* 1.34*  CALCIUM 9.0 8.7*   GFR: Estimated Creatinine Clearance: 62.4 mL/min (A) (by C-G formula based on SCr of 1.34 mg/dL (H)). Liver Function Tests: No results for input(s): AST, ALT, ALKPHOS, BILITOT, PROT, ALBUMIN in the last 168 hours. No results for input(s): LIPASE, AMYLASE in the last 168 hours. No results for  input(s): AMMONIA in the last 168 hours. Coagulation Profile: No results for input(s): INR, PROTIME in the last 168 hours. Cardiac Enzymes: Recent Labs  Lab 01/28/18 2039 01/29/18 0419  TROPONINI 0.10* 0.09*   BNP (last 3 results) No results for input(s): PROBNP in the last 8760 hours. HbA1C: No results for input(s): HGBA1C in the last 72 hours. CBG: No results for input(s): GLUCAP in the last 168 hours. Lipid Profile: No results for input(s): CHOL, HDL, LDLCALC, TRIG, CHOLHDL, LDLDIRECT in the last 72 hours. Thyroid Function Tests: No results for input(s): TSH, T4TOTAL, FREET4, T3FREE, THYROIDAB in the last 72 hours. Anemia Panel: No results for input(s): VITAMINB12, FOLATE, FERRITIN, TIBC, IRON, RETICCTPCT in the last 72 hours. Sepsis Labs: No results for input(s): PROCALCITON, LATICACIDVEN in the last 168 hours.  No results found for this or any previous visit (from the past 240 hour(s)).       Radiology Studies: Dg Chest 2 View  Result Date: 01/28/2018 CLINICAL DATA:  Shortness of breath.  History of COPD. EXAM: CHEST - 2 VIEW COMPARISON:  Chest x-ray dated 05/21/2012 and abdominal CT scan dated 12/20/2017 FINDINGS: There is new cardiomegaly with small bilateral pleural effusions. There is slight new pulmonary vascular prominence. Bilateral calcified granulomas. Tortuosity and calcification of the thoracic aorta. Neurostimulator wires in the midthoracic spine. No significant bone abnormality. Moderate hiatal hernia IMPRESSION: Findings consistent with congestive heart failure with new cardiomegaly. Pulmonary vascular congestion, and small pleural effusions. No discrete pulmonary edema. Electronically Signed   By: Lorriane Shire M.D.   On: 01/28/2018 16:33        Scheduled Meds: . amLODipine  5 mg Oral Daily  . benazepril  20 mg Oral QHS  . colesevelam  1,250 mg Oral BID WC  . doxazosin  4 mg Oral QHS  . famotidine  20 mg Oral Daily  . finasteride  5 mg Oral QHS  .  furosemide  40 mg Intravenous BID  . metoprolol tartrate  50 mg Oral BID  . mometasone-formoterol  2 puff Inhalation BID  . potassium chloride  10 mEq Oral Daily  . rivaroxaban  20 mg Oral QAC supper   Continuous Infusions:   LOS: 1 day    Time spent: 35 minutes.     Elmarie Shiley, MD [facility] Pager [phone]  If 7PM-7AM, please contact night-coverage [email or web address] Password [email or web address] 01/29/2018, 10:17 AM

## 2018-01-29 NOTE — Progress Notes (Signed)
  Echocardiogram 2D Echocardiogram has been performed.  John Stark 01/29/2018, 1:59 PM

## 2018-01-29 NOTE — Discharge Instructions (Signed)

## 2018-01-30 ENCOUNTER — Inpatient Hospital Stay (HOSPITAL_COMMUNITY): Payer: Medicare Other

## 2018-01-30 ENCOUNTER — Encounter (HOSPITAL_COMMUNITY): Payer: Self-pay | Admitting: Physician Assistant

## 2018-01-30 DIAGNOSIS — I5043 Acute on chronic combined systolic (congestive) and diastolic (congestive) heart failure: Secondary | ICD-10-CM

## 2018-01-30 DIAGNOSIS — I482 Chronic atrial fibrillation: Secondary | ICD-10-CM

## 2018-01-30 DIAGNOSIS — R7989 Other specified abnormal findings of blood chemistry: Secondary | ICD-10-CM

## 2018-01-30 DIAGNOSIS — R778 Other specified abnormalities of plasma proteins: Secondary | ICD-10-CM

## 2018-01-30 DIAGNOSIS — I10 Essential (primary) hypertension: Secondary | ICD-10-CM

## 2018-01-30 DIAGNOSIS — R748 Abnormal levels of other serum enzymes: Secondary | ICD-10-CM

## 2018-01-30 LAB — BASIC METABOLIC PANEL
ANION GAP: 11 (ref 5–15)
BUN: 30 mg/dL — ABNORMAL HIGH (ref 8–23)
CALCIUM: 8.7 mg/dL — AB (ref 8.9–10.3)
CO2: 27 mmol/L (ref 22–32)
Chloride: 103 mmol/L (ref 98–111)
Creatinine, Ser: 1.48 mg/dL — ABNORMAL HIGH (ref 0.61–1.24)
GFR calc Af Amer: 50 mL/min — ABNORMAL LOW (ref >60)
GFR calc non Af Amer: 43 mL/min — ABNORMAL LOW (ref >60)
GLUCOSE: 150 mg/dL — AB (ref 70–99)
Potassium: 4.1 mmol/L (ref 3.5–5.1)
Sodium: 141 mmol/L (ref 135–145)

## 2018-01-30 LAB — HEPARIN LEVEL (UNFRACTIONATED): Heparin Unfractionated: 2.14 IU/mL — ABNORMAL HIGH (ref 0.30–0.70)

## 2018-01-30 LAB — APTT: aPTT: 32 seconds (ref 24–36)

## 2018-01-30 MED ORDER — IPRATROPIUM BROMIDE 0.02 % IN SOLN
0.5000 mg | Freq: Three times a day (TID) | RESPIRATORY_TRACT | Status: DC
  Administered 2018-01-31 – 2018-02-03 (×12): 0.5 mg via RESPIRATORY_TRACT
  Filled 2018-01-30 (×12): qty 2.5

## 2018-01-30 MED ORDER — COLESEVELAM HCL 625 MG PO TABS
1250.0000 mg | ORAL_TABLET | Freq: Two times a day (BID) | ORAL | Status: DC
  Administered 2018-01-30 – 2018-02-08 (×18): 1250 mg via ORAL
  Filled 2018-01-30 (×20): qty 2

## 2018-01-30 MED ORDER — PREDNISONE 20 MG PO TABS
40.0000 mg | ORAL_TABLET | Freq: Every day | ORAL | Status: DC
  Administered 2018-01-31 – 2018-02-02 (×3): 40 mg via ORAL
  Filled 2018-01-30 (×3): qty 2

## 2018-01-30 MED ORDER — LEVALBUTEROL HCL 0.63 MG/3ML IN NEBU
0.6300 mg | INHALATION_SOLUTION | Freq: Four times a day (QID) | RESPIRATORY_TRACT | Status: DC
  Administered 2018-01-30: 0.63 mg via RESPIRATORY_TRACT
  Filled 2018-01-30: qty 3

## 2018-01-30 MED ORDER — LEVALBUTEROL HCL 0.63 MG/3ML IN NEBU
0.6300 mg | INHALATION_SOLUTION | Freq: Three times a day (TID) | RESPIRATORY_TRACT | Status: DC
  Administered 2018-01-31 – 2018-02-03 (×12): 0.63 mg via RESPIRATORY_TRACT
  Filled 2018-01-30 (×12): qty 3

## 2018-01-30 MED ORDER — PREDNISONE 20 MG PO TABS
40.0000 mg | ORAL_TABLET | Freq: Every day | ORAL | Status: DC
  Administered 2018-01-30: 40 mg via ORAL
  Filled 2018-01-30: qty 2

## 2018-01-30 MED ORDER — HEPARIN (PORCINE) IN NACL 100-0.45 UNIT/ML-% IJ SOLN
1300.0000 [IU]/h | INTRAMUSCULAR | Status: DC
  Administered 2018-01-30 – 2018-02-01 (×3): 1500 [IU]/h via INTRAVENOUS
  Filled 2018-01-30 (×3): qty 250

## 2018-01-30 MED ORDER — PANTOPRAZOLE SODIUM 40 MG PO TBEC
40.0000 mg | DELAYED_RELEASE_TABLET | Freq: Every day | ORAL | Status: DC
  Administered 2018-01-30: 40 mg via ORAL
  Filled 2018-01-30: qty 1

## 2018-01-30 MED ORDER — ASPIRIN EC 81 MG PO TBEC
81.0000 mg | DELAYED_RELEASE_TABLET | Freq: Every day | ORAL | Status: DC
  Administered 2018-01-30 – 2018-02-04 (×5): 81 mg via ORAL
  Filled 2018-01-30 (×5): qty 1

## 2018-01-30 MED ORDER — IPRATROPIUM BROMIDE 0.02 % IN SOLN
0.5000 mg | Freq: Four times a day (QID) | RESPIRATORY_TRACT | Status: DC
  Administered 2018-01-30: 0.5 mg via RESPIRATORY_TRACT
  Filled 2018-01-30: qty 2.5

## 2018-01-30 NOTE — Progress Notes (Signed)
ANTICOAGULATION CONSULT NOTE - Initial Consult  Pharmacy Consult for Heparin Indication: atrial fibrillation  Allergies  Allergen Reactions  . Crestor [Rosuvastatin Calcium] Other (See Comments)    Muscle aches   . Pravastatin Other (See Comments)    Muscle aches   . Simvastatin Other (See Comments)    Muscle aches     Patient Measurements: Height: 5\' 11"  (180.3 cm) Weight: (!) 300 lb 4.3 oz (136.2 kg) IBW/kg (Calculated) : 75.3 Heparin Dosing Weight: 108 kg  Vital Signs: Temp: 97.8 F (36.6 C) (08/07 1106) Temp Source: Oral (08/07 1106) BP: 115/61 (08/07 1106) Pulse Rate: 112 (08/07 1106)  Labs: Recent Labs    01/28/18 1611 01/28/18 2039 01/29/18 0419 01/29/18 0936 01/30/18 0658  HGB 11.4*  --   --   --   --   HCT 36.9*  --   --   --   --   PLT 158  --   --   --   --   CREATININE 1.35*  --  1.34*  --  1.48*  TROPONINI  --  0.10* 0.09* 0.09*  --     Estimated Creatinine Clearance: 56.1 mL/min (A) (by C-G formula based on SCr of 1.48 mg/dL (H)).   Medical History: Past Medical History:  Diagnosis Date  . AAA (abdominal aortic aneurysm) ([place])    a. infrarenal abdominal aortic aneurysm by CT 11/2017 4.7 x 3.9 cm versus 4.4 x 4 1 cm  . Asthma    "as a child"  . Bladder cancer ([place])   . Carotid artery disease ([place])    a. carotid duplex 2019 with minimal plaque, no significant stenosis.  . CKD (chronic kidney disease), stage III ([place])   . COPD (chronic obstructive pulmonary disease) ([place])   . Edema    FEET/LEGS  . Enlarged prostate   . GERD (gastroesophageal reflux disease)   . Hearing loss in right ear   . Hiatal hernia    By CT  . HLD (hyperlipidemia)   . HTN (hypertension)   . Neuropathy   . Osteoarthrosis, unspecified whether generalized or localized, unspecified site   . Overweight(278.02)   . Permanent atrial fibrillation ([place])    a. Xarelto started 05/22/12.  . Right knee pain    awaiting knee replacement   Assessment:  82 yr old male on  Xarelto prior to admission for atrial fibrillation.  Now to transition to IV heparin.  Planning cardiac cath on 02/01/18.   Last Xarelto dose ~6pm on 01/29/18.    Will use aPTTs for heparin monitoring, since recent Xarelto doses expected to falsely elevate heparin levels.  Goal of Therapy:  Heparin level 0.3-0.7 units/ml aPTT 66-102  seconds Monitor platelets by anticoagulation protocol: Yes   Plan:   Baseline aPTT and heparin level.  Heparin drip to begin ~6pm at 1500 units/hr (~14 units/kg adjusted BW/hr)  aPTT and heparin level ~8 hrs after drip begins.  Daily aPTT, heparin level and CBC.  Xarelto on hold.  Arty Baumgartner, [place] Pager: [phone]  01/30/2018,3:57 PM

## 2018-01-30 NOTE — Consult Note (Addendum)
Cardiology Consultation:   Patient ID: ORLO BRICKLE; [id number]; 1935/08/24   Admit date: 01/28/2018 Date of Consult: 01/30/2018  Primary Care Provider: Alroy Dust, Carlean Jews.Marlou Sa, MD Primary Cardiologist: Lauree Chandler, MD  Chief Complaint: shortness of breath  Patient Profile:   John Stark is a 82 y.o. male with a hx of permanent atrial fibrillation (prior DCCV with recurrence, managed rate control), HTN, HLD, BPH, GERD, COPD, bladder cancer 02/2017 (resection/chemo), ?CKD stage III (Cr 1.25 in 11/2017), recent visual changes who is being seen today for the evaluation of SOB/decreased EF at the request of Dr. Tyrell Antonio.  History of Present Illness:   Mr. Tamburo is followed by Dr. Angelena Form. He has no known history of CAD. He did have prior normal LV function by last echo in 2013 but did have RV dysfunction. He had a cardioversion in 2013 that didn't hold so he's been managed with rate control for his atrial fib since he's been relatively asymptomatic over the years. He is on Xarelto. He saw Dr. Angelena Form 12/2017 and was doing well, except his ophthalmologist had been concerned recently about a possible episode of amaurosis fugax. About 6 weeks ago the patient saw a curtain down over his left visual field for 30 seconds. Carotid duplex showed only mild plaque. Dr. Angelena Form commented that he felt embolic phenomena was less likely given the patient was on Xarelto. The patient says he had not missed any doses recently surrounding this event. Family has also noticed L eye drooping after this event but does have excess tissue over eyelid. He's not had any recurrent episodes.  He used to take Lasix for history of edema but has been skipping doses due to the inconvenience of urinating frequently.  He does eat out frequently and it sounds like he is getting a lot of high salt foods.  About a month ago he began noticing increased dyspnea on exertion with even minimal activity as well as increasing  lower extremity edema.  Over the last 2 weeks, this has worsened and he noted an abrupt 10 pound weight gain.  He has not had any chest pain, palpitations, syncope, bleeding, and reports he is otherwise compliant with his medications. Labs reveal flat troponins 0.10-0.09-0.09, BNP 1523, mild anemia with Hgb 11.4, Cr 1.35 (previously 1.25) -> up to 1.48 with diuresis. Findings consistent with congestive heart failure with new cardiomegaly, pulmonary vascular congestion, and small pleural effusions.  He has been started on IV Lasix with weight loss from 310->304->300. His weight at office 12/31/17 at 305lb and he is net -5L. Edema improving but not resolved - states "they were footballs." Last dose of Xarelto was yesterday PM. 2D echo showed EF 40-45% with diffuse HK with akinesis of inferior myocardium, diastolic flattening, mildly dilated 23mm ascending aorta, mild MR, severely dilated RV, moderately reduced RV function, severely dilated RA, moderately increased PASP of 21mmHg (echo in 2013 did show moderately reduced RV function and PASP of 56mmHg). He is also being treated for AECOPD given wheezing that improves with nebs.  Past Medical History:  Diagnosis Date  . AAA (abdominal aortic aneurysm) ([place])    a. infrarenal abdominal aortic aneurysm by CT 11/2017 4.7 x 3.9 cm versus 4.4 x 4 1 cm  . Asthma    "as a child"  . Bladder cancer ([place])   . Carotid artery disease ([place])    a. carotid duplex 2019 with minimal plaque, no significant stenosis.  . CKD (chronic kidney disease), stage III ([place])   . COPD (  chronic obstructive pulmonary disease) ([place])   . Edema    FEET/LEGS  . Enlarged prostate   . GERD (gastroesophageal reflux disease)   . Hearing loss in right ear   . Hiatal hernia    By CT  . HLD (hyperlipidemia)   . HTN (hypertension)   . Neuropathy   . Osteoarthrosis, unspecified whether generalized or localized, unspecified site   . Overweight(278.02)   . Permanent atrial fibrillation ([place])     a. Xarelto started 05/22/12.  . Right knee pain    awaiting knee replacement    Past Surgical History:  Procedure Laterality Date  . BACK SURGERY     2011  . CARDIOVERSION  06/18/2012   Procedure: CARDIOVERSION;  Surgeon: Josue Hector, MD;  Location: [facility] ENDOSCOPY;  Service: Cardiovascular;  Laterality: N/A;  . CATARACT EXTRACTION W/PHACO Right 09/07/2016   Procedure: CATARACT EXTRACTION PHACO AND INTRAOCULAR LENS PLACEMENT ([place]);  Surgeon: Eulogio Bear, MD;  Location: [facility] ORS;  Service: Ophthalmology;  Laterality: Right;  [place] 42.9 AP% 10.0 CDE 4.52 Fluid pack lot # [id number]  . CATARACT EXTRACTION W/PHACO Left 10/12/2016   Procedure: CATARACT EXTRACTION PHACO AND INTRAOCULAR LENS PLACEMENT ([facility]);  Surgeon: Eulogio Bear, MD;  Location: [facility] ORS;  Service: Ophthalmology;  Laterality: Left;  [place] 34.2 AP% 9.0 CDE 3.17 Fluid pack lot # [id number]  . COLONOSCOPY    . JOINT REPLACEMENT    . SPINAL CORD STIMULATOR INSERTION N/A 02/18/2016   Procedure: LUMBAR SPINAL CORD STIMULATOR INSERTION;  Surgeon: Clydell Hakim, MD;  Location: [place] [facility] NEURO ORS;  Service: Neurosurgery;  Laterality: N/A;  LUMBAR SPINAL CORD STIMULATOR INSERTION  . TOTAL KNEE ARTHROPLASTY Right 08/07/2012   Procedure: TOTAL KNEE ARTHROPLASTY;  Surgeon: Kerin Salen, MD;  Location: [facility];  Service: Orthopedics;  Laterality: Right;  . TRANSURETHRAL RESECTION OF BLADDER TUMOR N/A 05/04/2017   Procedure: TRANSURETHRAL RESECTION OF BLADDER TUMOR (TURBT)/ INTRAVESICAL CHEMOTHERAPY INSTILLATION;  Surgeon: Kathie Rhodes, MD;  Location: [facility] ORS;  Service: Urology;  Laterality: N/A;     Inpatient Medications: Scheduled Meds: . benazepril  20 mg Oral QHS  . colesevelam  1,250 mg Oral BID WC  . doxazosin  4 mg Oral QHS  . famotidine  20 mg Oral Daily  . finasteride  5 mg Oral QHS  . furosemide  40 mg Intravenous BID  . ipratropium-albuterol  3 mL Nebulization Q6H  . metoprolol tartrate  50 mg Oral BID  . mometasone-formoterol  2  puff Inhalation BID  . potassium chloride  10 mEq Oral Daily  . predniSONE  40 mg Oral Q breakfast  . rivaroxaban  20 mg Oral QAC supper   Continuous Infusions:  PRN Meds: acetaminophen **OR** acetaminophen, ondansetron **OR** ondansetron (ZOFRAN) IV, polyethylene glycol  Home Meds: Prior to Admission medications   Medication Sig Start Date End Date Taking? Authorizing Provider  amLODipine (NORVASC) 5 MG tablet Take 5 mg by mouth daily.    Yes [provider]  benazepril (LOTENSIN) 10 MG tablet Take 20 mg by mouth at bedtime.    Yes [provider]  celecoxib (CELEBREX) 200 MG capsule Take 200 mg by mouth daily as needed for mild pain.    Yes [provider]  doxazosin (CARDURA) 4 MG tablet Take 4 mg by mouth at bedtime.    Yes [provider]  famotidine (PEPCID) 20 MG tablet Take 1 tablet (20 mg total) by mouth daily. 08/03/17  Yes Burnell Blanks, MD  finasteride (PROSCAR) 5 MG  tablet Take 5 mg by mouth at bedtime.    Yes [provider]  Fluticasone-Salmeterol (ADVAIR) 250-50 MCG/DOSE AEPB Inhale 1 puff into the lungs 2 (two) times daily.   Yes [provider]  furosemide (LASIX) 40 MG tablet Take 40 mg by mouth daily.   Yes [provider]  meclizine (ANTIVERT) 25 MG tablet Take 25 mg by mouth 3 (three) times daily as needed for dizziness.   Yes [provider]  metoprolol tartrate (LOPRESSOR) 50 MG tablet Take 1 tablet (50 mg total) by mouth 2 (two) times daily. 08/03/17  Yes Burnell Blanks, MD  Nutritional Supplements (JUICE PLUS FIBRE PO) Take 6 tablets by mouth daily. 2 nuts tablets, 2 fruit tablets, and 2 veggie tablets   Yes [provider]  potassium chloride (MICRO-K) 10 MEQ CR capsule Take 10 mEq by mouth daily.    Yes [provider]  PROAIR HFA 108 (90 Base) MCG/ACT inhaler Inhale 2 puffs into the lungs every 4 (four) hours as needed for shortness of breath or wheezing.   04/20/16  Yes [provider]  vitamin B-12 (CYANOCOBALAMIN) 100 MCG tablet Take 100 mcg by mouth daily.   Yes [provider]  [facility] 625 MG tablet Take 1,250 mg by mouth 2 (two) times daily with a meal.  07/16/13  Yes [provider]  XARELTO 20 MG TABS tablet TAKE 1 TABLET BY MOUTH ONCE DAILY 07/09/17  Yes Burnell Blanks, MD  cyclobenzaprine (FLEXERIL) 5 MG tablet Take 1 tablet (5 mg total) by mouth at bedtime and may repeat dose one time if needed. Patient not taking: Reported on 01/28/2018 12/20/17   Recardo Evangelist, PA-C    Allergies:    Allergies  Allergen Reactions  . Crestor [Rosuvastatin Calcium] Other (See Comments)    Muscle aches   . Pravastatin Other (See Comments)    Muscle aches   . Simvastatin Other (See Comments)    Muscle aches     Social History:   Social History   Socioeconomic History  . Marital status: Married    Spouse name: Not on file  . Number of children: 2  . Years of education: Not on file  . Highest education level: Not on file  Occupational History  . Occupation: Probation officer [employer]  Social Needs  . Financial resource strain: Not on file  . Food insecurity:    Worry: Not on file    Inability: Not on file  . Transportation needs:    Medical: Not on file    Non-medical: Not on file  Tobacco Use  . Smoking status: Former Smoker    Packs/day: 1.00    Years: 20.00    Pack years: 20.00    Types: Cigarettes    Last attempt to quit: 05/22/1977    Years since quitting: 40.7  . Smokeless tobacco: Never Used  . Tobacco comment: Quit 1978  Substance and Sexual Activity  . Alcohol use: No  . Drug use: No  . Sexual activity: Not on file  Lifestyle  . Physical activity:    Days per week: Not on file    Minutes per session: Not on file  . Stress: Not on file  Relationships  . Social connections:    Talks on phone: Not on file    Gets together: Not on file    Attends religious service: Not on  file    Active member of club or organization: Not on file  Attends meetings of clubs or organizations: Not on file    Relationship status: Not on file  . Intimate partner violence:    Fear of current or ex partner: Not on file    Emotionally abused: Not on file    Physically abused: Not on file    Forced sexual activity: Not on file  Other Topics Concern  . Not on file  Social History Narrative   Retired. Married. Regularly exercises.     Family History:   The patient's family history includes Cancer in his other; Heart attack (age of onset: 31) in his brother.  ROS:  Please see the history of present illness.  All other ROS reviewed and negative.     Physical Exam/Data:   Vitals:   01/30/18 0833 01/30/18 0834 01/30/18 1106 01/30/18 1417  BP:   115/61   Pulse:   (!) 112   Resp:      Temp:   97.8 F (36.6 C)   TempSrc:   Oral   SpO2: 95% 95% 93% 94%  Weight:      Height:        Intake/Output Summary (Last 24 hours) at 01/30/2018 1436 Last data filed at 01/30/2018 1335 Gross per 24 hour  Intake 720 ml  Output 2050 ml  Net -1330 ml   Filed Weights   01/28/18 2038 01/29/18 0037 01/30/18 0547  Weight: (!) 310 lb (140.6 kg) (!) 304 lb 8 oz (138.1 kg) (!) 300 lb 4.3 oz (136.2 kg)   Body mass index is 41.88 kg/m.  General: Well developed, well nourished, in no acute distress. Head: Normocephalic, atraumatic, sclera non-icteric, no xanthomas, nares are without discharge.  Neck: Negative for carotid bruits. JVD not elevated. Lungs: Clear bilaterally to auscultation without wheezes, rales, or rhonchi. Breathing is unlabored. Heart: RRR with S1 S2. No murmurs, rubs, or gallops appreciated. Abdomen: Soft, non-tender, non-distended with normoactive bowel sounds. No hepatomegaly. No rebound/guarding. No obvious abdominal masses. Msk:  Strength and tone appear normal for age. Extremities: No clubbing or cyanosis. No edema.  Distal pedal pulses are 2+ and equal  bilaterally. Neuro: Alert and oriented X 3. No facial asymmetry. No focal deficit. Moves all extremities spontaneously. Psych:  Responds to questions appropriately with a normal affect.  EKG:  The EKG was personally reviewed and demonstrates atrial fib 97bpm lower voltage QRS, accentuation of prior TWI, otherwise nonspecific ST-T changes  Relevant CV Studies: 2d echo yesterday Study Conclusions - Left ventricle: The cavity size was normal. Systolic function was   mildly to moderately reduced. The estimated ejection fraction was   in the range of 40% to 45%. Diffuse hypokinesis. There is   akinesis of the inferior myocardium. - Ventricular septum: The contour showed diastolic flattening. - Aortic valve: Valve mobility was restricted. There was trivial   regurgitation. - Aorta: Ascending aortic diameter: 41 mm (S). - Ascending aorta: The ascending aorta was mildly dilated. - Mitral valve: Calcified annulus. There was mild regurgitation. - Right ventricle: The cavity size was severely dilated. Wall   thickness was normal. Systolic function was moderately reduced. - Right atrium: The atrium was severely dilated. - Pulmonary arteries: Systolic pressure was moderately increased.   PA peak pressure: 60 mm Hg (S).  Laboratory Data:  Chemistry Recent Labs  Lab 01/28/18 1611 01/29/18 0419 01/30/18 0658  NA 144 143 141  K 4.7 4.0 4.1  CL 109 107 103  CO2 26 24 27   GLUCOSE 101* 103* 150*  BUN 26* 24* 30*  CREATININE 1.35* 1.34* 1.48*  CALCIUM 9.0 8.7* 8.7*  GFRNONAA 48* 48* 43*  GFRAA 56* 56* 50*  ANIONGAP 9 12 11     No results for input(s): PROT, ALBUMIN, AST, ALT, ALKPHOS, BILITOT in the last 168 hours. Hematology Recent Labs  Lab 01/28/18 1611  WBC 7.4  RBC 3.76*  HGB 11.4*  HCT 36.9*  MCV 98.1  MCH 30.3  MCHC 30.9  RDW 16.6*  PLT 158   Cardiac Enzymes Recent Labs  Lab 01/28/18 2039 01/29/18 0419 01/29/18 0936  TROPONINI 0.10* 0.09* 0.09*    Recent Labs   Lab 01/28/18 1614  TROPIPOC 0.07    BNP Recent Labs  Lab 01/28/18 2039  BNP 1,523.4*    DDimer No results for input(s): DDIMER in the last 168 hours.  Radiology/Studies:  Dg Chest 2 View  Result Date: 01/28/2018 CLINICAL DATA:  Shortness of breath.  History of COPD. EXAM: CHEST - 2 VIEW COMPARISON:  Chest x-ray dated 05/21/2012 and abdominal CT scan dated 12/20/2017 FINDINGS: There is new cardiomegaly with small bilateral pleural effusions. There is slight new pulmonary vascular prominence. Bilateral calcified granulomas. Tortuosity and calcification of the thoracic aorta. Neurostimulator wires in the midthoracic spine. No significant bone abnormality. Moderate hiatal hernia IMPRESSION: Findings consistent with congestive heart failure with new cardiomegaly. Pulmonary vascular congestion, and small pleural effusions. No discrete pulmonary edema. Electronically Signed   By: Lorriane Shire M.D.   On: 01/28/2018 16:33    Assessment and Plan:   1. Acute (on likely chronic) combined CHF with newly recognized LV dysfunction with WMA -he has not had a prior ischemic evaluation.  His EF is newly decreased compared to prior and there is a wall motion abnormality which is concerning for possibility of development of CAD.  He needs further diuresis before performing cath.  This might be considered for Friday.  We will hold Xarelto and start heparin per pharmacy.  Start aspirin 81 mg daily.  He has mild anemia but this appears stable.  Will check a lipid profile in the morning.  Would continue to diuresis as you are doing, and follow strict I's and O's and daily weights. He is forthcoming about his Lasix noncompliance but seems to understand the importance of taking this. We have ordered a dietitian evaluation for the patient to help educate him a low sodium options as he struggles with this.  Will change inpatient diet to low-sodium with fluid restriction.  2. Pulm HTN - increased pressures noted on  echo. Possibly due to combination of HF + obesity. May need to consider right heart cath at time of cardiac cath.  3. Permanent atrial fib - rates variable but also getting nebs. See above re: anticoagulation. Rate control strategy, no plans for DCCV. Will switch albuterol to Xopenex. Check TSH in AM. Continue BB at present dose.  4. AKI on probable CKD III -will hold ACE for now while we are actively diuresing given bump in his creatinine.  Suspect some degree of chronic kidney insufficiency given prior creatinine of 1.25.  This will need to be followed.  5. Morbid obesity -if not already completed previously, would consider outpatient sleep study given obesity and pulmonary hypertension.  6. HTN - follow BP with med changes this admission.  7. Visual changes -Per discussion with Dr. Radford Pax, we will obtain a CT of the head without contrast to evaluate for possibility of occult stroke.  This episode happened 6 weeks ago.  There was no obvious carotid disease on duplex  and it is umlikely that he had had a cardiac embolic event given his ongoing Xarelto use.  8. Mild anemia - appreciate internal medicines' eval of this.  For questions or updates, please contact [facility] Please consult [email or web address] for contact info under Cardiology/STEMI.    Signed, Charlie Pitter, PA-C  01/30/2018 2:36 PM

## 2018-01-30 NOTE — Progress Notes (Signed)
PROGRESS NOTE    John Stark  XQJ:[id number] DOB: [birth year] DOA: 01/28/2018 PCP: Alroy Dust, L.Marlou Sa, MD    Brief Narrative: John Stark is a 82 y.o. male with medical history significant for CHF, HTN,  HLD, GERD, COPD, stage III chronic kidney disease permanent atrial fibrillation status post prior DCCV with recurrence, presented to the ED with complaints of shortness of breath of 2 weeks duration, 10 pound weight gain and worsening bilateral lower extremity swelling in the context of missing Lasix doses and eating salty food.  Admitted for decompensated CHF.  New cardiomyopathy noted.  Cardiology consulted.   Patient admitted with acute heart failure exacerbation.   Assessment & Plan:   Principal Problem:   Acute exacerbation of CHF (congestive heart failure) (HCC) Active Problems:   Overweight   Essential hypertension   DIASTOLIC HEART FAILURE, CHRONIC   Atrial fibrillation (HCC)   COPD (chronic obstructive pulmonary disease) (HCC)   Acute on chronic combined systolic and diastolic CHF (congestive heart failure) (HCC)   Elevated troponin  Acute on chronic combined CHF Likely precipitated by poor compliance to diuretics and low-salt diet and new cardiomyopathy.  Cardiology consulted and indicate that he has not had prior ischemic evaluation.  He has a wall motion abnormality concerning for possible CAD.  The plan further diuresis before considering cardiac cath possibly 8/9.  Thereby Xarelto held and heparin per pharmacy started.  Aspirin 81 mg daily also started.  -5 L since admission.  Continue IV furosemide 40 mg twice daily.  Cardiomyopathy Need to rule out ischemic etiology.  Cardiology considering cardiac cath pending improvement of CHF status.  Pulmonary hypertension May be due to CHF and obesity.  As per cardiology, may need right heart cath as well.  Permanent atrial fibrillation Mild uncontrolled ventricular rate.  Xarelto changed to IV heparin pending cardiac  cath.  Continue beta-blockers.  Albuterol switched to Xopenex.  Follow TSH.  Wheezing/COPD exacerbation versus cardiac asthma versus VCD Patient currently has mostly upper airway wheezing but not stridor.  Otherwise no clinical bronchospasm.  Change IV Solu-Medrol to oral prednisone taper.  Added PPI to Pepcid.  Mild acute on stage III chronic kidney disease: Cardiology has had ACEI given bump in his creatinine.  Baseline creatinine may be in the 1.2 range.  Follow BMP closely.  Essential hypertension Continue metoprolol.  ACEI discontinued.  Continue doxazosin.  Morbid obesity/Body mass index is 41.88 kg/m.  Visual changes: As per cardiology, had these 6 weeks ago and suspected amaurosis fugax but stroke felt less likely due to being on Xarelto.  Cardiology obtaining CT head without contrast for possible occult stroke.  Based on CT may need to consider MRI.  Anemia Follow CBCs periodically.   DVT prophylaxis: Xarelto discontinued and IV heparin started 8/7 Code Status: Full code.  Family Communication: None at bedside Disposition Plan: DC home pending clinical improvement and cardiology evaluation, may take several days.   Consultants:   none   Procedures:   ECHO  Antimicrobials:  Doxy.   Subjective: Reports that his dyspnea has improved but breathing is not yet at baseline.  Leg edema has significantly improved.  Reports that he misses home Lasix dose about 2-3 times per week due to concern for urinary urgency/frequency when he goes outside his home.  No chest pain.  Objective: Vitals:   01/30/18 0833 01/30/18 0834 01/30/18 1106 01/30/18 1417  BP:   115/61   Pulse:   (!) 112   Resp:      Temp:  97.8 F (36.6 C)   TempSrc:   Oral   SpO2: 95% 95% 93% 94%  Weight:      Height:        Intake/Output Summary (Last 24 hours) at 01/30/2018 1541 Last data filed at 01/30/2018 1452 Gross per 24 hour  Intake 720 ml  Output 2550 ml  Net -1830 ml   Filed Weights    01/28/18 2038 01/29/18 0037 01/30/18 0547  Weight: (!) 140.6 kg (310 lb) (!) 138.1 kg (304 lb 8 oz) (!) 136.2 kg (300 lb 4.3 oz)    Examination:  General exam: Pleasant elderly male, moderately built and morbidly obese lying comfortably propped up in bed. Respiratory system: Few bibasilar crackles.  Mild upper airway wheezing but no stridor.  Same wheezing conducted down to his lung feels without other rhonchi or wheezing. Cardiovascular system: S1 and S2 heard, irregularly irregular.  Mild JVD.  1+ pitting bilateral leg edema.  Telemetry personally reviewed and shows A. fib in the 100-110's.  Gastrointestinal system: Abdomen is nondistended, soft and nontender. No organomegaly or masses felt. Normal bowel sounds heard.  Stable. Central nervous system: Alert and oriented. No focal neurological deficits.  Stable. Extremities: Symmetric 5 x 5 power. Skin: No rashes, lesions or ulcers   Data Reviewed: I have personally reviewed following labs and imaging studies  CBC: Recent Labs  Lab 01/28/18 1611  WBC 7.4  HGB 11.4*  HCT 36.9*  MCV 98.1  PLT 751   Basic Metabolic Panel: Recent Labs  Lab 01/28/18 1611 01/29/18 0419 01/30/18 0658  NA 144 143 141  K 4.7 4.0 4.1  CL 109 107 103  CO2 26 24 27   GLUCOSE 101* 103* 150*  BUN 26* 24* 30*  CREATININE 1.35* 1.34* 1.48*  CALCIUM 9.0 8.7* 8.7*   GFR: Estimated Creatinine Clearance: 56.1 mL/min (A) (by C-G formula based on SCr of 1.48 mg/dL (H)).  Cardiac Enzymes: Recent Labs  Lab 01/28/18 2039 01/29/18 0419 01/29/18 0936  TROPONINI 0.10* 0.09* 0.09*     Radiology Studies: Dg Chest 2 View  Result Date: 01/28/2018 CLINICAL DATA:  Shortness of breath.  History of COPD. EXAM: CHEST - 2 VIEW COMPARISON:  Chest x-ray dated 05/21/2012 and abdominal CT scan dated 12/20/2017 FINDINGS: There is new cardiomegaly with small bilateral pleural effusions. There is slight new pulmonary vascular prominence. Bilateral calcified granulomas.  Tortuosity and calcification of the thoracic aorta. Neurostimulator wires in the midthoracic spine. No significant bone abnormality. Moderate hiatal hernia IMPRESSION: Findings consistent with congestive heart failure with new cardiomegaly. Pulmonary vascular congestion, and small pleural effusions. No discrete pulmonary edema. Electronically Signed   By: Lorriane Shire M.D.   On: 01/28/2018 16:33        Scheduled Meds: . aspirin EC  81 mg Oral Daily  . colesevelam  1,250 mg Oral BID WC  . doxazosin  4 mg Oral QHS  . famotidine  20 mg Oral Daily  . finasteride  5 mg Oral QHS  . furosemide  40 mg Intravenous BID  . ipratropium  0.5 mg Nebulization Q6H  . levalbuterol  0.63 mg Nebulization Q6H  . metoprolol tartrate  50 mg Oral BID  . mometasone-formoterol  2 puff Inhalation BID  . potassium chloride  10 mEq Oral Daily  . predniSONE  40 mg Oral Q breakfast   Continuous Infusions:   LOS: 2 days    Time spent: 35 minutes.    Vernell Leep, MD, FACP, [facility]. [facility] Pager [phone]  If  7PM-7AM, please contact night-coverage [email or web address] Password [email or web address] 01/30/2018, 3:55 PM

## 2018-01-30 NOTE — Plan of Care (Signed)
  Problem: Activity: Goal: Risk for activity intolerance will decrease Outcome: Progressing Note:  Patient walking with mobility tech   Problem: Nutrition: Goal: Adequate nutrition will be maintained Outcome: Progressing Note:  Patient verbalized understanding on low sodium diet   Problem: Cardiac: Goal: Ability to achieve and maintain adequate cardiopulmonary perfusion will improve Outcome: Progressing Note:  Cardiac a-fib stable rate   Problem: Coping: Goal: Level of anxiety will decrease Outcome: Completed/Met Note:  Daughter provides emotional support

## 2018-01-30 NOTE — Evaluation (Signed)
Physical Therapy Evaluation Patient Details Name: John Stark MRN: [id number] DOB: [birth year] Today's Date: 01/30/2018   History of Present Illness  82 y.o. male with medical history significant for CHF, HTN, atrial fibrillation, presented to the ED with complaints of shortness of breath of 2 weeks duration  Clinical Impression  Orders received for PT evaluation. Patient demonstrates deficits in functional mobility as indicated below. Will benefit from continued skilled PT to address deficits and maximize function. Will see as indicated and progress as tolerated.   Patient ambulated on room air with saturations >91% throughout, however HR elevated to upper 120s with ambulation and 1 standing rest break   Follow Up Recommendations Home health PT;Supervision/Assistance - 24 hour    Equipment Recommendations  None recommended by PT    Recommendations for Other Services       Precautions / Restrictions Precautions Precautions: Fall      Mobility  Bed Mobility Overal bed mobility: Needs Assistance Bed Mobility: Rolling;Supine to Sit;Sit to Supine Rolling: Supervision   Supine to sit: Min guard Sit to supine: Min assist   General bed mobility comments: increased time to perform, assist to reposition and elevate LE back to bed  Transfers Overall transfer level: Needs assistance Equipment used: Rolling walker (2 wheeled) Transfers: Sit to/from Stand Sit to Stand: Min guard         General transfer comment: min guard for safety and stability, no physical assist required  Ambulation/Gait Ambulation/Gait assistance: Min guard Gait Distance (Feet): 90 Feet Assistive device: Rolling walker (2 wheeled) Gait Pattern/deviations: Step-to pattern;Decreased stride length;Wide base of support;Trunk flexed Gait velocity: decreased   General Gait Details: patient ambulated on room air with saturations >91% throughout, however HR elevated to upper 120s with ambulation and 1  standing rest break  Stairs            Wheelchair Mobility    Modified Rankin (Stroke Patients Only)       Balance Overall balance assessment: Needs assistance Sitting-balance support: Feet supported Sitting balance-Leahy Scale: Fair       Standing balance-Leahy Scale: Poor Standing balance comment: reliance on UE support                             Pertinent Vitals/Pain Pain Assessment: No/denies pain    Home Living Family/patient expects to be discharged to:: Private residence Living Arrangements: Spouse/significant other Available Help at Discharge: Family Type of Home: House Home Access: Stairs to enter   Technical brewer of Steps: 2 Home Layout: Two level        Prior Function Level of Independence: Independent with assistive device(s)         Comments: uses a rollator for mobility     Hand Dominance   Dominant Hand: Right    Extremity/Trunk Assessment   Upper Extremity Assessment Upper Extremity Assessment: Generalized weakness    Lower Extremity Assessment Lower Extremity Assessment: Generalized weakness       Communication   Communication: HOH  Cognition Arousal/Alertness: Awake/alert Behavior During Therapy: WFL for tasks assessed/performed Overall Cognitive Status: Within Functional Limits for tasks assessed                                        General Comments      Exercises     Assessment/Plan    PT Assessment Patient needs continued  PT services  PT Problem List Decreased strength;Decreased activity tolerance;Decreased balance;Decreased mobility;Cardiopulmonary status limiting activity       PT Treatment Interventions DME instruction;Gait training;Stair training;Functional mobility training;Therapeutic activities;Therapeutic exercise;Balance training;Patient/family education    PT Goals (Current goals can be found in the Care Plan section)  Acute Rehab PT Goals Patient Stated  Goal: to go home PT Goal Formulation: With patient Time For Goal Achievement: 02/13/18 Potential to Achieve Goals: Good    Frequency Min 3X/week   Barriers to discharge        Co-evaluation               AM-PAC PT "6 Clicks" Daily Activity  Outcome Measure Difficulty turning over in bed (including adjusting bedclothes, sheets and blankets)?: A Lot Difficulty moving from lying on back to sitting on the side of the bed? : A Lot Difficulty sitting down on and standing up from a chair with arms (e.g., wheelchair, bedside commode, etc,.)?: A Lot Help needed moving to and from a bed to chair (including a wheelchair)?: A Little Help needed walking in hospital room?: A Little Help needed climbing 3-5 steps with a railing? : A Lot 6 Click Score: 14    End of Session Equipment Utilized During Treatment: Gait belt Activity Tolerance: Patient limited by fatigue Patient left: in bed;with call bell/phone within reach;with family/visitor present Nurse Communication: Mobility status PT Visit Diagnosis: Difficulty in walking, not elsewhere classified (R26.2)    Time: 9326-7124 PT Time Calculation (min) (ACUTE ONLY): 19 min   Charges:   PT Evaluation $PT Eval Moderate Complexity: [place], PT DPT  Board Certified Neurologic Specialist [place] 01/30/2018, 3:52 PM

## 2018-01-31 LAB — BASIC METABOLIC PANEL
ANION GAP: 8 (ref 5–15)
Anion gap: 11 (ref 5–15)
BUN: 38 mg/dL — ABNORMAL HIGH (ref 8–23)
BUN: 39 mg/dL — AB (ref 8–23)
CHLORIDE: 101 mmol/L (ref 98–111)
CHLORIDE: 100 mmol/L (ref 98–111)
CO2: 29 mmol/L (ref 22–32)
CO2: 28 mmol/L (ref 22–32)
Calcium: 8.8 mg/dL — ABNORMAL LOW (ref 8.9–10.3)
Calcium: 8.3 mg/dL — ABNORMAL LOW (ref 8.9–10.3)
Creatinine, Ser: 1.83 mg/dL — ABNORMAL HIGH (ref 0.61–1.24)
Creatinine, Ser: 1.75 mg/dL — ABNORMAL HIGH (ref 0.61–1.24)
GFR calc Af Amer: 41 mL/min — ABNORMAL LOW (ref >60)
GFR calc non Af Amer: 33 mL/min — ABNORMAL LOW (ref >60)
GFR calc non Af Amer: 35 mL/min — ABNORMAL LOW (ref >60)
GFR, EST AFRICAN AMERICAN: 38 mL/min — AB (ref >60)
GLUCOSE: 137 mg/dL — AB (ref 70–99)
Glucose, Bld: 132 mg/dL — ABNORMAL HIGH (ref 70–99)
POTASSIUM: 4.6 mmol/L (ref 3.5–5.1)
POTASSIUM: 4.7 mmol/L (ref 3.5–5.1)
SODIUM: 141 mmol/L (ref 135–145)
SODIUM: 136 mmol/L (ref 135–145)

## 2018-01-31 LAB — LIPID PANEL
CHOL/HDL RATIO: 3.4 ratio
CHOLESTEROL: 140 mg/dL (ref 0–200)
HDL: 41 mg/dL (ref >40)
LDL Cholesterol: 92 mg/dL (ref 0–99)
TRIGLYCERIDES: 34 mg/dL (ref <150)
VLDL: 7 mg/dL (ref 0–40)

## 2018-01-31 LAB — CBC
HEMATOCRIT: 37.6 % — AB (ref 39.0–52.0)
Hemoglobin: 11.9 g/dL — ABNORMAL LOW (ref 13.0–17.0)
MCH: 30 pg (ref 26.0–34.0)
MCHC: 31.6 g/dL (ref 30.0–36.0)
MCV: 94.7 fL (ref 78.0–100.0)
Platelets: 181 10*3/uL (ref 150–400)
RBC: 3.97 MIL/uL — ABNORMAL LOW (ref 4.22–5.81)
RDW: 16 % — AB (ref 11.5–15.5)
WBC: 15.9 10*3/uL — ABNORMAL HIGH (ref 4.0–10.5)

## 2018-01-31 LAB — TSH: TSH: 0.979 u[IU]/mL (ref 0.350–4.500)

## 2018-01-31 LAB — APTT: APTT: 86 s — AB (ref 24–36)

## 2018-01-31 LAB — HEPARIN LEVEL (UNFRACTIONATED): HEPARIN UNFRACTIONATED: 1.3 [IU]/mL — AB (ref 0.30–0.70)

## 2018-01-31 MED ORDER — SODIUM CHLORIDE 0.9% FLUSH: 3.0000 mL | INTRAVENOUS | Status: DC | PRN

## 2018-01-31 MED ORDER — SODIUM CHLORIDE 0.9 % IV SOLN
INTRAVENOUS | Status: DC
  Administered 2018-02-01: 07:00:00 via INTRAVENOUS

## 2018-01-31 MED ORDER — SODIUM CHLORIDE 0.9 % IV SOLN: 250.0000 mL | INTRAVENOUS | Status: DC | PRN

## 2018-01-31 MED ORDER — SODIUM CHLORIDE 0.9% FLUSH
3.0000 mL | Freq: Two times a day (BID) | INTRAVENOUS | Status: DC
  Administered 2018-01-31 – 2018-02-01 (×2): 3 mL via INTRAVENOUS

## 2018-01-31 MED ORDER — ASPIRIN 81 MG PO CHEW
81.0000 mg | CHEWABLE_TABLET | ORAL | Status: AC
  Administered 2018-02-01: 81 mg via ORAL
  Filled 2018-01-31: qty 1

## 2018-01-31 NOTE — Progress Notes (Signed)
  Transitions of Care Polypharmacy Medication Deprescribing Review  John Stark is a 82 y.o. male admitted for CHF exacerbation on 01/28/2018.   The patient's PTA medications and comorbidities were reviewed to identify opportunities for medication optimization through deprescribing.   Short-Term Recommendations: . Discontinue cyclobenzaprine. Patient reports only trying this a few times and finding it ineffective; no longer experiences muscle spasms. . Consider discontinuing amlodipine as multiple antihypertensives likely not necessary and amlodipine may contribute to edema. BP as far back as 2014 has only gone over 201 systolic once. . Switch metoprolol tartrate to metoprolol succinate for heart failure mortality benefit.   Long-Term Recommendations: . Consider need for doxazosin + finasteride to treat BPH, as doxazosin has risk for orthostatic hypotension.   Patient Assessment:  . Extent of Polypharmacy: 16 PTA medications, hyperpolypharmacy (10+ meds) o Associated risk: severe o Number of home Rx meds: 14 o Number of home OTC meds: 2 . Comorbidity Polypharmacy Score: 23 o Associated risk: morbid . Charlson Comorbidity Index: 6 o Associated risk: severe-morbid (2% estimated 10-year survival) . GerontoNet Adverse Drug Event Risk Score: 6 o Associated risk: moderate (~12% risk ADE)   Overall risk: moderate to high; patient is still classified as hyperpolypharmacy without PRN meds and has many comorbidities.   Medication Assessment:  . Potentially Inappropriate Medications (PIMs)  o High risk medications: 1 - Meclizine o Moderate risk medications: 4 - Celecoxib, cyclobenzaprine, doxazosin, furosemide o Low risk medications: 0 . Medications Associated with Geriatric Syndromes (MAGS): 8 o Amlodipine, benazepril, celecoxib, cyclobenzaprine, doxazosin, furosemide, meclizine, metoprolol tartrate  Overall risk: moderate to high; multiple antihypertensives and opportunities for  optimization to lower-risk medications exist    The patient was interviewed to determine his interest in making medication changes. Shared decision-making was used to determine the optimal medications to deprescribe.   Patient Deprescribing Readiness: . Patient Opinions About Deprescribing survey score: 11 o Score of 4-8: negative patient opinion of deprescribing; deprescribing likely to be unsuccessful o Score of 9-11: intermediate patient opinion of deprescribing; deprescribing may be successful o Score of 12-16: positive patient opinion of deprescribing; deprescribing likely to be successful   Shared Decision-Making Discussion:  . Patient had no concerns about his medications but expressed interest in the potential for decreasing the number of medications taken daily. He reported keeping track of all of his medications at home and was knowledgeable about their uses. He was advised of the anticholinergic risks associated with frequent meclizine use and CV/GI risks associated with frequent celecoxib use. He reported using these PRN medications very sparingly at home. He also reported no longer using cyclobenzaprine and had only tried it a few times.    Please see above for final recommendations.   John Stark, Student-PharmD 01/31/2018 3:37 PM

## 2018-01-31 NOTE — Progress Notes (Signed)
[facility] for Heparin Indication: atrial fibrillation  Allergies  Allergen Reactions  . Crestor [Rosuvastatin Calcium] Other (See Comments)    Muscle aches   . Pravastatin Other (See Comments)    Muscle aches   . Simvastatin Other (See Comments)    Muscle aches     Patient Measurements: Height: 5\' 11"  (180.3 cm) Weight: 296 lb 11.2 oz (134.6 kg) IBW/kg (Calculated) : 75.3 Heparin Dosing Weight: 108 kg  Vital Signs: Temp: 98.1 F (36.7 C) (08/07 1956) Temp Source: Oral (08/07 1956) BP: 114/67 (08/07 2111) Pulse Rate: 95 (08/07 2111)  Labs: Recent Labs    01/28/18 1611 01/28/18 2039 01/29/18 0419 01/29/18 0936 01/30/18 0658 01/30/18 1657 01/31/18 0303  HGB 11.4*  --   --   --   --   --  11.9*  HCT 36.9*  --   --   --   --   --  37.6*  PLT 158  --   --   --   --   --  181  APTT  --   --   --   --   --  32 86*  HEPARINUNFRC  --   --   --   --   --  2.14*  --   CREATININE 1.35*  --  1.34*  --  1.48*  --   --   TROPONINI  --  0.10* 0.09* 0.09*  --   --   --     Estimated Creatinine Clearance: 55.7 mL/min (A) (by C-G formula based on SCr of 1.48 mg/dL (H)).   Medical History: Past Medical History:  Diagnosis Date  . AAA (abdominal aortic aneurysm) ([place])    a. infrarenal abdominal aortic aneurysm by CT 11/2017 4.7 x 3.9 cm versus 4.4 x 4 1 cm  . Asthma    "as a child"  . Bladder cancer ([place])   . Carotid artery disease ([place])    a. carotid duplex 2019 with minimal plaque, no significant stenosis.  . CKD (chronic kidney disease), stage III ([place])   . COPD (chronic obstructive pulmonary disease) ([place])   . Edema    FEET/LEGS  . Enlarged prostate   . GERD (gastroesophageal reflux disease)   . Hearing loss in right ear   . Hiatal hernia    By CT  . HLD (hyperlipidemia)   . HTN (hypertension)   . Neuropathy   . Osteoarthrosis, unspecified whether generalized or localized, unspecified site   . Overweight(278.02)   . Permanent  atrial fibrillation ([place])    a. Xarelto started 05/22/12.  . Right knee pain    awaiting knee replacement   Assessment: 82 yr old male on Xarelto prior to admission for atrial fibrillation.  Now onIV heparin.  Planning cardiac cath on 02/01/18. Last Xarelto dose ~6pm on 01/29/18. -aPTT= 86  Goal of Therapy:  Heparin level 0.3-0.7 units/ml aPTT 66-102  seconds Monitor platelets by anticoagulation protocol: Yes   Plan:  No heparin changes needed Daily Heparin level and aPTT  Hildred Laser, PharmD Clinical Pharmacist Please check Amion for pharmacy contact number

## 2018-01-31 NOTE — Progress Notes (Signed)
Progress Note  Patient Name: John Stark Date of Encounter: 01/31/2018  Primary Cardiologist: Lauree Chandler, MD   Subjective   Denies any chest pain.  Still has some mild shortness of breath but is able to lie flat in the bed  Inpatient Medications    Scheduled Meds: . aspirin EC  81 mg Oral Daily  . colesevelam  1,250 mg Oral BID WC  . doxazosin  4 mg Oral QHS  . famotidine  20 mg Oral Daily  . finasteride  5 mg Oral QHS  . ipratropium  0.5 mg Nebulization TID  . levalbuterol  0.63 mg Nebulization TID  . metoprolol tartrate  50 mg Oral BID  . mometasone-formoterol  2 puff Inhalation BID  . predniSONE  40 mg Oral Q breakfast   Continuous Infusions: . heparin 1,500 Units/hr (01/31/18 0848)   PRN Meds: acetaminophen **OR** acetaminophen, ondansetron **OR** ondansetron (ZOFRAN) IV, polyethylene glycol   Vital Signs    Vitals:   01/30/18 2111 01/31/18 0306 01/31/18 0410 01/31/18 0733  BP: 114/67  109/60   Pulse: 95  65   Resp:   18   Temp:   97.6 F (36.4 C)   TempSrc:   Oral   SpO2:   94% 94%  Weight:  134.6 kg    Height:        Intake/Output Summary (Last 24 hours) at 01/31/2018 0956 Last data filed at 01/31/2018 0556 Gross per 24 hour  Intake 632.12 ml  Output 1050 ml  Net -417.88 ml   Filed Weights   01/29/18 0037 01/30/18 0547 01/31/18 0306  Weight: (!) 138.1 kg (!) 136.2 kg 134.6 kg    Telemetry    Atrial fibrillation with controlled ventricular response- Personally Reviewed  ECG    No new EKG to review- Personally Reviewed  Physical Exam   GEN: No acute distress.   Neck: No JVD Cardiac:  irregularly irregular, no murmurs, rubs, or gallops.  Respiratory:  Scattered expiratory wheezes throughout GI: Soft, nontender, non-distended  MS: No edema; No deformity. Neuro:  Nonfocal  Psych: Normal affect   Labs    Chemistry Recent Labs  Lab 01/29/18 0419 01/30/18 0658 01/31/18 0303  NA 143 141 141  K 4.0 4.1 4.6  CL 107 103  101  CO2 24 27 29   GLUCOSE 103* 150* 132*  BUN 24* 30* 38*  CREATININE 1.34* 1.48* 1.83*  CALCIUM 8.7* 8.7* 8.8*  GFRNONAA 48* 43* 33*  GFRAA 56* 50* 38*  ANIONGAP 12 11 11      Hematology Recent Labs  Lab 01/28/18 1611 01/31/18 0303  WBC 7.4 15.9*  RBC 3.76* 3.97*  HGB 11.4* 11.9*  HCT 36.9* 37.6*  MCV 98.1 94.7  MCH 30.3 30.0  MCHC 30.9 31.6  RDW 16.6* 16.0*  PLT 158 181    Cardiac Enzymes Recent Labs  Lab 01/28/18 2039 01/29/18 0419 01/29/18 0936  TROPONINI 0.10* 0.09* 0.09*    Recent Labs  Lab 01/28/18 1614  TROPIPOC 0.07     BNP Recent Labs  Lab 01/28/18 2039  BNP 1,523.4*     DDimer No results for input(s): DDIMER in the last 168 hours.   Radiology    Ct Head Wo Contrast  Result Date: 01/30/2018 CLINICAL DATA:  Brief LEFT visual field cut 6 weeks ago. Evaluate amaurosis fugax. History of carotid artery disease, hypertension, hyperlipidemia. EXAM: CT HEAD WITHOUT CONTRAST TECHNIQUE: Contiguous axial images were obtained from the base of the skull through the vertex without intravenous contrast.  COMPARISON:  None. FINDINGS: BRAIN: No intraparenchymal hemorrhage, mass effect nor midline shift. Moderate to severe global parenchymal brain volume loss, moderate lobulated ventricular margin with mild sulcal effacement at the convexities. No hydrocephalus. Patchy supratentorial white matter hypodensities within normal range for patient's age, though non-specific are most compatible with chronic small vessel ischemic disease. No acute large vascular territory infarcts. No abnormal extra-axial fluid collections. Basal cisterns are patent. VASCULAR: Mild calcific atherosclerosis of the carotid siphons. SKULL: No skull fracture. No significant scalp soft tissue swelling. SINUSES/ORBITS: Trace paranasal sinus mucosal thickening. Subcentimeter frontoethmoidal osteomas. Mastoid air cells are well aerated.The included ocular globes and orbital contents are non-suspicious.  Status post bilateral ocular lens implants. OTHER: None. IMPRESSION: 1. No acute intracranial process. 2. Moderate very to severe parenchymal brain volume loss with suspected component of normal pressure hydrocephalus. 3. Moderate chronic small vessel ischemic changes. Electronically Signed   By: Elon Alas M.D.   On: 01/30/2018 16:54    Cardiac Studies   2D echo 01/29/2018 Study Conclusions  - Left ventricle: The cavity size was normal. Systolic function was   mildly to moderately reduced. The estimated ejection fraction was   in the range of 40% to 45%. Diffuse hypokinesis. There is   akinesis of the inferior myocardium. - Ventricular septum: The contour showed diastolic flattening. - Aortic valve: Valve mobility was restricted. There was trivial   regurgitation. - Aorta: Ascending aortic diameter: 41 mm (S). - Ascending aorta: The ascending aorta was mildly dilated. - Mitral valve: Calcified annulus. There was mild regurgitation. - Right ventricle: The cavity size was severely dilated. Wall   thickness was normal. Systolic function was moderately reduced. - Right atrium: The atrium was severely dilated. - Pulmonary arteries: Systolic pressure was moderately increased.   PA peak pressure: 60 mm Hg (S).  Patient Profile     82 y.o. male with a hx of permanent atrial fibrillation (prior DCCV with recurrence, managed rate control), HTN, HLD, BPH, GERD, COPD, bladder cancer 02/2017 (resection/chemo), ?CKD stage III (Cr 1.25 in 11/2017), recent visual changes who is being seen  for the evaluation of SOB/decreased EF at the request of Dr. Tyrell Antonio.  Assessment & Plan    1.  Acute on chronic combined systolic and diastolic CHF -He says his weight is usually 290 pounds but the last time he saw Dr. Camillia Herter office he was 305 pounds.   -He was 310 pounds on admission and is down 6 kg from admission.  He is down 2 kg from yesterday -He put out 1.55 L yesterday and is net -5.2 L -He is  still volume overloaded with lower extremity edema. -Creatinine bumped from 1.48 > 1.83 today. -Baseline appears to be around 1.16-1.25 -Hold Lasix today due to upward trending creatinine  2.  Permanent atrial fibrillation -Heart rate controlled for the most part  -DuoNeb changed to Xopenex -Continue on  Lopressor 50 mg twice daily -Xarelto on hold for planned cath on Friday -Continue IV heparin drip  3.  Acute on chronic kidney disease stage III -Baseline creatinine seems to be around 1.16-1.25 -Creatinine bumped today to 1.83 -Continue to follow closely while diuresing  4.  Hypertension -BP is well controlled on exam.  BP controlled at 109/60 mmHg today. -Continue Lopressor 50 mg twice daily -Holding ACE and diuretic due to worsening renal function.  5. Pulmonary hypertension -Echo shows worsening pulmonary hypertension with PASP 60 mmHg. -likely a combination of group 2 (pulmonary venous hypertension from CHF) and group 3 from  COPD with exacerbation -Continue nebulizers  -Diuretics on hold for worsening renal function -Given worsening RV systolic function and RV enlargement as well as worsening pulmonary hypertension we will plan right heart cath at the time of left heart cath   6.  Elevated troponin -Minimally elevated with flat trend at 0.1>0.09>0.09 -Likely demand ischemia in the setting of COPD exacerbation, chronic kidney disease and CHF -2D echo does show worsening LV function with EF now 40 to 45% with a new inferior wall motion abnormality which corresponds to Q waves in the inferior leads on EKG. -Troponin only minimally elevated with flat trend and could be consistent with demand ischemia in the setting of COPD exacerbation but given declining EF with new wall motion abormality, recommend cardiac catheterization  -Declining renal function may prohibit left heart catheterization.  He assess creatinine in the morning.  Could consider nuclear stress test to rule out  ischemia and flat trend of troponin and then get right heart cath to assess filling pressures if no ischemia on stress test -Continue IV heparin drip, beta-blocker and add aspirin 81 mg daily. -He is statin intolerant      For questions or updates, please contact [facility] Please consult [email or web address] for contact info under Cardiology/STEMI.      Signed, Fransico Him, MD  01/31/2018, 9:56 AM

## 2018-01-31 NOTE — Progress Notes (Addendum)
PROGRESS NOTE    John Stark  JOI:[id number] DOB: 03-29-1936 DOA: 01/28/2018 PCP: Alroy Dust, L.Marlou Sa, MD    Brief Narrative: John Stark is a 82 y.o. male with medical history significant for CHF, HTN,  HLD, GERD, COPD, stage III chronic kidney disease permanent atrial fibrillation status post prior DCCV with recurrence, presented to the ED with complaints of shortness of breath of 2 weeks duration, 10 pound weight gain and worsening bilateral lower extremity swelling in the context of missing Lasix doses and eating salty food.  Admitted for decompensated CHF.  New cardiomyopathy noted.  Cardiology consulted.  Hospital course complicated by acute on chronic kidney injury.  Lasix discontinued.  Cardiology plans cardiac cath pending improvement in renal function.  Assessment & Plan:   Principal Problem:   Acute exacerbation of CHF (congestive heart failure) (HCC) Active Problems:   Overweight   Essential hypertension   DIASTOLIC HEART FAILURE, CHRONIC   Atrial fibrillation (HCC)   COPD (chronic obstructive pulmonary disease) (HCC)   Acute on chronic combined systolic and diastolic CHF (congestive heart failure) (HCC)   Elevated troponin  Acute on chronic combined CHF Likely precipitated by poor compliance to diuretics and low-salt diet and new cardiomyopathy.  Cardiology consulted and indicate that he has not had prior ischemic evaluation.  He has a wall motion abnormality concerning for possible CAD.  He was diuresed with Lasix IV with improvement.  -6.2 L since admission.  Weight down by 6 kgs since admission.  Lasix discontinued 8/8 due to worsening renal insufficiency. Cardiology considering cardiac cath but worsening renal function may prohibit and could consider nuclear stress test to rule out ischemia, flat troponin trend may be from demand ischemia.  I discussed with Dr. Radford Pax 8/8.  Xarelto held and heparin per pharmacy started 8/7 in preparation for possible cath.  Aspirin 81  mg daily also started.    Cardiomyopathy Need to rule out ischemic etiology.  Please see discussion above regarding ischemic evaluation.  Pulmonary hypertension May be due to CHF and obesity.  As per cardiology, may need right heart cath as well.  Permanent atrial fibrillation Today with controlled ventricular rate in the 80s-90s.  Xarelto changed to IV heparin pending cardiac cath.  Continue beta-blockers.  Albuterol switched to Xopenex.  TSH normal.  Wheezing/COPD exacerbation versus cardiac asthma versus VCD Patient had mostly upper airway wheezing but not stridor on 8/7.  Otherwise no clinical bronchospasm.  Change IV Solu-Medrol to oral prednisone taper.  Added PPI to Pepcid.  Improving.  Continue management.  Acute on stage III chronic kidney disease: ACEI was discontinued 8/7 given bump in his creatinine.  Baseline creatinine may be in the 1.2 range.  Creatinine has worsened from 1.48-1.83, likely due to diuresis.  IV Lasix discontinued 8/8.  Follow BMP in a.m.  Encouraged increased oral fluid intake.  Essential hypertension Continue metoprolol & doxazosin.  ACEI discontinued.  Controlled.  Morbid obesity/Body mass index is 41.38 kg/m.  Visual changes: As per cardiology, had these 6 weeks ago and suspected amaurosis fugax but stroke felt less likely due to being on Xarelto.  CT head 8/7 showed no acute findings but some concern for NPH which can be evaluated as outpatient.    Anemia Follow CBCs periodically.   DVT prophylaxis: Xarelto discontinued and IV heparin started 8/7 Code Status: Full code.  Family Communication: None at bedside Disposition Plan: DC home pending clinical improvement and cardiology evaluation, may take several days.   Consultants:   none  Procedures:   ECHO  Antimicrobials:  Doxycycline discontinued after single dose on 8/5  Subjective: Reports that leg swelling has significantly improved from admission, dyspnea has improved but not  close to baseline.  No chest pain reported.  Wheezing better compared to yesterday.  Able to sleep better last night.  Patient reports that he ambulated yesterday with assistance on room air and did not feel much dyspneic and was saturating in the low 90s.  As per RN, no acute issues noted.  Objective: Vitals:   01/31/18 0306 01/31/18 0410 01/31/18 0733 01/31/18 1128  BP:  109/60  (!) 133/95  Pulse:  65  87  Resp:  18    Temp:  97.6 F (36.4 C)  (!) 97.5 F (36.4 C)  TempSrc:  Oral  Oral  SpO2:  94% 94% 97%  Weight: 134.6 kg     Height:        Intake/Output Summary (Last 24 hours) at 01/31/2018 1404 Last data filed at 01/31/2018 1109 Gross per 24 hour  Intake 392.12 ml  Output 2050 ml  Net -1657.88 ml   Filed Weights   01/29/18 0037 01/30/18 0547 01/31/18 0306  Weight: (!) 138.1 kg (!) 136.2 kg 134.6 kg    Examination:  General exam: Pleasant elderly male, moderately built and morbidly obese lying comfortably propped up in bed.  Looks improved compared to yesterday. Respiratory system: Significantly improved upper airway wheezing although still present.  Occasional basal crackles.  No increased work of breathing. Cardiovascular system: S1 and S2 heard, irregularly irregular.  Mild JVD.  Trace pitting bilateral leg edema.  Telemetry personally reviewed: A. fib with ventricular rate in the 80s-90s. Gastrointestinal system: Abdomen is nondistended, soft and nontender. No organomegaly or masses felt. Normal bowel sounds heard.  Stable Central nervous system: Alert and oriented. No focal neurological deficits.  Stable Extremities: Symmetric 5 x 5 power. Skin: No rashes, lesions or ulcers   Data Reviewed: I have personally reviewed following labs and imaging studies  CBC: Recent Labs  Lab 01/28/18 1611 01/31/18 0303  WBC 7.4 15.9*  HGB 11.4* 11.9*  HCT 36.9* 37.6*  MCV 98.1 94.7  PLT 158 092   Basic Metabolic Panel: Recent Labs  Lab 01/28/18 1611 01/29/18 0419  01/30/18 0658 01/31/18 0303  NA 144 143 141 141  K 4.7 4.0 4.1 4.6  CL 109 107 103 101  CO2 26 24 27 29   GLUCOSE 101* 103* 150* 132*  BUN 26* 24* 30* 38*  CREATININE 1.35* 1.34* 1.48* 1.83*  CALCIUM 9.0 8.7* 8.7* 8.8*   GFR: Estimated Creatinine Clearance: 45.1 mL/min (A) (by C-G formula based on SCr of 1.83 mg/dL (H)).  Cardiac Enzymes: Recent Labs  Lab 01/28/18 2039 01/29/18 0419 01/29/18 0936  TROPONINI 0.10* 0.09* 0.09*     Radiology Studies: Ct Head Wo Contrast  Result Date: 01/30/2018 CLINICAL DATA:  Brief LEFT visual field cut 6 weeks ago. Evaluate amaurosis fugax. History of carotid artery disease, hypertension, hyperlipidemia. EXAM: CT HEAD WITHOUT CONTRAST TECHNIQUE: Contiguous axial images were obtained from the base of the skull through the vertex without intravenous contrast. COMPARISON:  None. FINDINGS: BRAIN: No intraparenchymal hemorrhage, mass effect nor midline shift. Moderate to severe global parenchymal brain volume loss, moderate lobulated ventricular margin with mild sulcal effacement at the convexities. No hydrocephalus. Patchy supratentorial white matter hypodensities within normal range for patient's age, though non-specific are most compatible with chronic small vessel ischemic disease. No acute large vascular territory infarcts. No abnormal extra-axial fluid collections.  Basal cisterns are patent. VASCULAR: Mild calcific atherosclerosis of the carotid siphons. SKULL: No skull fracture. No significant scalp soft tissue swelling. SINUSES/ORBITS: Trace paranasal sinus mucosal thickening. Subcentimeter frontoethmoidal osteomas. Mastoid air cells are well aerated.The included ocular globes and orbital contents are non-suspicious. Status post bilateral ocular lens implants. OTHER: None. IMPRESSION: 1. No acute intracranial process. 2. Moderate very to severe parenchymal brain volume loss with suspected component of normal pressure hydrocephalus. 3. Moderate chronic  small vessel ischemic changes. Electronically Signed   By: Elon Alas M.D.   On: 01/30/2018 16:54        Scheduled Meds: . aspirin EC  81 mg Oral Daily  . colesevelam  1,250 mg Oral BID WC  . doxazosin  4 mg Oral QHS  . famotidine  20 mg Oral Daily  . finasteride  5 mg Oral QHS  . ipratropium  0.5 mg Nebulization TID  . levalbuterol  0.63 mg Nebulization TID  . metoprolol tartrate  50 mg Oral BID  . mometasone-formoterol  2 puff Inhalation BID  . predniSONE  40 mg Oral Q breakfast   Continuous Infusions: . heparin 1,500 Units/hr (01/31/18 0848)     LOS: 3 days    Time spent: 25 minutes.    Vernell Leep, MD, FACP, [facility]. [facility] Pager [phone]  If 7PM-7AM, please contact night-coverage [email or web address] Password [email or web address] 01/31/2018, 2:04 PM

## 2018-01-31 NOTE — Plan of Care (Signed)
Nutrition Education Note  RD consulted for nutrition education regarding CHF.  Spoke with pt, who reports he has a great appetite ("that's the one thing that's still working"). He reports he consumes 3 meals per day (Breakfast: eggs and oatmeal, Lunch: soup and sandwich, Dinner: fish and vegetables). Pt wife does most of the cooking. He reports he does not add salt to his food. He will eat out 2-3 times per week at [place].   RD provided "Low Sodium Nutrition Therapy" handout from the Academy of [employer]. Reviewed patient's dietary recall. Provided examples on ways to decrease sodium intake in diet. Discouraged intake of processed foods and use of salt shaker. Encouraged fresh fruits and vegetables as well as whole grain sources of carbohydrates to maximize fiber intake.   RD discussed why it is important for patient to adhere to diet recommendations, and emphasized the role of fluids, foods to avoid, and importance of weighing self daily. Teach back method used.  Expect fair compliance.  Body mass index is 41.38 kg/m. Pt meets criteria for obesity, class III based on current BMI.  Current diet order is 2 grams sodium with 1800 ml fluid restriction, patient is consuming approximately 100% of meals at this time. Labs and medications reviewed. No further nutrition interventions warranted at this time. RD contact information provided. If additional nutrition issues arise, please re-consult RD.   Jayda White A. Jimmye Norman, RD, LDN, CDE Pager: [phone] After hours Pager: [phone]

## 2018-02-01 ENCOUNTER — Encounter (HOSPITAL_COMMUNITY)
Admission: EM | Disposition: A | Discharge status: Skilled Nursing Facility | Payer: Self-pay | Source: Home / Self Care | Attending: Internal Medicine

## 2018-02-01 DIAGNOSIS — N189 Chronic kidney disease, unspecified: Secondary | ICD-10-CM

## 2018-02-01 DIAGNOSIS — N179 Acute kidney failure, unspecified: Secondary | ICD-10-CM

## 2018-02-01 HISTORY — PX: RIGHT/LEFT HEART CATH AND CORONARY ANGIOGRAPHY: CATH118266

## 2018-02-01 LAB — BASIC METABOLIC PANEL
Anion gap: 9 (ref 5–15)
BUN: 36 mg/dL — AB (ref 8–23)
CHLORIDE: 104 mmol/L (ref 98–111)
CO2: 27 mmol/L (ref 22–32)
CREATININE: 1.55 mg/dL — AB (ref 0.61–1.24)
Calcium: 8.5 mg/dL — ABNORMAL LOW (ref 8.9–10.3)
GFR calc Af Amer: 47 mL/min — ABNORMAL LOW (ref >60)
GFR calc non Af Amer: 41 mL/min — ABNORMAL LOW (ref >60)
GLUCOSE: 104 mg/dL — AB (ref 70–99)
Potassium: 4.5 mmol/L (ref 3.5–5.1)
SODIUM: 140 mmol/L (ref 135–145)

## 2018-02-01 LAB — POCT I-STAT 3, VENOUS BLOOD GAS (G3P V)
ACID-BASE EXCESS: 1 mmol/L (ref 0.0–2.0)
ACID-BASE EXCESS: 3 mmol/L — AB (ref 0.0–2.0)
BICARBONATE: 28.5 mmol/L — AB (ref 20.0–28.0)
Bicarbonate: 27.3 mmol/L (ref 20.0–28.0)
O2 Saturation: 58 %
O2 Saturation: 59 %
PH VEN: 7.361 (ref 7.250–7.430)
TCO2: 30 mmol/L (ref 22–32)
TCO2: 29 mmol/L (ref 22–32)
pCO2, Ven: 48.1 mmHg (ref 44.0–60.0)
pCO2, Ven: 48.5 mmHg (ref 44.0–60.0)
pH, Ven: 7.377 (ref 7.250–7.430)
pO2, Ven: 32 mmHg (ref 32.0–45.0)
pO2, Ven: 32 mmHg (ref 32.0–45.0)

## 2018-02-01 LAB — CBC
HEMATOCRIT: 35.7 % — AB (ref 39.0–52.0)
Hemoglobin: 11.4 g/dL — ABNORMAL LOW (ref 13.0–17.0)
MCH: 30.4 pg (ref 26.0–34.0)
MCHC: 31.9 g/dL (ref 30.0–36.0)
MCV: 95.2 fL (ref 78.0–100.0)
PLATELETS: 168 10*3/uL (ref 150–400)
RBC: 3.75 MIL/uL — ABNORMAL LOW (ref 4.22–5.81)
RDW: 16.1 % — AB (ref 11.5–15.5)
WBC: 11.4 10*3/uL — AB (ref 4.0–10.5)

## 2018-02-01 LAB — POCT I-STAT 3, ART BLOOD GAS (G3+)
ACID-BASE EXCESS: 1 mmol/L (ref 0.0–2.0)
Bicarbonate: 26.4 mmol/L (ref 20.0–28.0)
O2 SAT: 96 %
TCO2: 28 mmol/L (ref 22–32)
pCO2 arterial: 44.9 mmHg (ref 32.0–48.0)
pH, Arterial: 7.377 (ref 7.350–7.450)
pO2, Arterial: 83 mmHg (ref 83.0–108.0)

## 2018-02-01 LAB — APTT

## 2018-02-01 LAB — HEPARIN LEVEL (UNFRACTIONATED): Heparin Unfractionated: 1.32 IU/mL — ABNORMAL HIGH (ref 0.30–0.70)

## 2018-02-01 SURGERY — RIGHT/LEFT HEART CATH AND CORONARY ANGIOGRAPHY
Anesthesia: LOCAL

## 2018-02-01 MED ORDER — HEPARIN (PORCINE) IN NACL 1000-0.9 UT/500ML-% IV SOLN
INTRAVENOUS | Status: DC | PRN
  Administered 2018-02-01 (×2): 500 mL

## 2018-02-01 MED ORDER — LIDOCAINE HCL (PF) 1 % IJ SOLN
INTRAMUSCULAR | Status: AC
  Filled 2018-02-01: qty 30

## 2018-02-01 MED ORDER — IOPAMIDOL (ISOVUE-370) INJECTION 76%
INTRAVENOUS | Status: DC | PRN
  Administered 2018-02-01: 45 mL

## 2018-02-01 MED ORDER — FENTANYL CITRATE (PF) 100 MCG/2ML IJ SOLN
INTRAMUSCULAR | Status: DC | PRN
  Administered 2018-02-01: 25 ug via INTRAVENOUS

## 2018-02-01 MED ORDER — LIDOCAINE HCL (PF) 1 % IJ SOLN
INTRAMUSCULAR | Status: DC | PRN
  Administered 2018-02-01 (×2): 2 mL

## 2018-02-01 MED ORDER — VERAPAMIL HCL 2.5 MG/ML IV SOLN
INTRAVENOUS | Status: AC
  Filled 2018-02-01: qty 2

## 2018-02-01 MED ORDER — HEPARIN (PORCINE) IN NACL 1000-0.9 UT/500ML-% IV SOLN
INTRAVENOUS | Status: AC
  Filled 2018-02-01: qty 1000

## 2018-02-01 MED ORDER — HEPARIN (PORCINE) IN NACL 100-0.45 UNIT/ML-% IJ SOLN
1300.0000 [IU]/h | INTRAMUSCULAR | Status: DC
  Administered 2018-02-01 – 2018-02-03 (×4): 1300 [IU]/h via INTRAVENOUS
  Filled 2018-02-01 (×5): qty 250

## 2018-02-01 MED ORDER — FENTANYL CITRATE (PF) 100 MCG/2ML IJ SOLN
INTRAMUSCULAR | Status: AC
  Filled 2018-02-01: qty 2

## 2018-02-01 MED ORDER — MIDAZOLAM HCL 2 MG/2ML IJ SOLN
INTRAMUSCULAR | Status: DC | PRN
  Administered 2018-02-01: 0.5 mg via INTRAVENOUS

## 2018-02-01 MED ORDER — MIDAZOLAM HCL 2 MG/2ML IJ SOLN
INTRAMUSCULAR | Status: AC
  Filled 2018-02-01: qty 2

## 2018-02-01 MED ORDER — VERAPAMIL HCL 2.5 MG/ML IV SOLN
INTRAVENOUS | Status: DC | PRN
  Administered 2018-02-01: 10 mL via INTRA_ARTERIAL

## 2018-02-01 MED ORDER — HEPARIN (PORCINE) IN NACL 100-0.45 UNIT/ML-% IJ SOLN: 1300.0000 [IU]/h | INTRAMUSCULAR | Status: DC

## 2018-02-01 MED ORDER — HEPARIN SODIUM (PORCINE) 1000 UNIT/ML IJ SOLN
INTRAMUSCULAR | Status: AC
  Filled 2018-02-01: qty 1

## 2018-02-01 MED ORDER — SODIUM CHLORIDE 0.9% FLUSH
3.0000 mL | Freq: Two times a day (BID) | INTRAVENOUS | Status: DC
  Administered 2018-02-01 – 2018-02-06 (×7): 3 mL via INTRAVENOUS

## 2018-02-01 MED ORDER — IOPAMIDOL (ISOVUE-370) INJECTION 76%
INTRAVENOUS | Status: AC
  Filled 2018-02-01: qty 100

## 2018-02-01 MED ORDER — SODIUM CHLORIDE 0.9 % IV SOLN: 250.0000 mL | INTRAVENOUS | Status: DC | PRN

## 2018-02-01 MED ORDER — HEPARIN SODIUM (PORCINE) 1000 UNIT/ML IJ SOLN
INTRAMUSCULAR | Status: DC | PRN
  Administered 2018-02-01: 5000 [IU] via INTRAVENOUS

## 2018-02-01 MED ORDER — SODIUM CHLORIDE 0.9% FLUSH: 3.0000 mL | INTRAVENOUS | Status: DC | PRN

## 2018-02-01 SURGICAL SUPPLY — 15 items
CATH 5FR JL3.5 JR4 ANG PIG MP (CATHETERS) ×2 IMPLANT
CATH BALLN WEDGE 5F 110CM (CATHETERS) ×2 IMPLANT
CATH INFINITI 5FR JL4 (CATHETERS) ×2 IMPLANT
CATH INFINITI 5FR JL5 (CATHETERS) ×2 IMPLANT
DEVICE RAD COMP TR BAND LRG (VASCULAR PRODUCTS) ×2 IMPLANT
GLIDESHEATH SLEND SS 6F .021 (SHEATH) ×2 IMPLANT
GUIDEWIRE .025 260CM (WIRE) ×2 IMPLANT
GUIDEWIRE INQWIRE 1.5J.035X260 (WIRE) ×1 IMPLANT
HOVERMATT SINGLE USE (MISCELLANEOUS) ×2 IMPLANT
INQWIRE 1.5J .035X260CM (WIRE) ×2
KIT HEART LEFT (KITS) ×2 IMPLANT
PACK CARDIAC CATHETERIZATION (CUSTOM PROCEDURE TRAY) ×2 IMPLANT
SHEATH GLIDE SLENDER 4/5FR (SHEATH) ×2 IMPLANT
TRANSDUCER W/STOPCOCK (MISCELLANEOUS) ×2 IMPLANT
TUBING CIL FLEX 10 FLL-RA (TUBING) ×2 IMPLANT

## 2018-02-01 NOTE — Progress Notes (Addendum)
Progress Note  Patient Name: John Stark Date of Encounter: 02/01/2018  Primary Cardiologist: Lauree Chandler, MD   Subjective   No chest pain.  SOB continues to improve  Inpatient Medications    Scheduled Meds: . aspirin EC  81 mg Oral Daily  . colesevelam  1,250 mg Oral BID WC  . doxazosin  4 mg Oral QHS  . famotidine  20 mg Oral Daily  . finasteride  5 mg Oral QHS  . ipratropium  0.5 mg Nebulization TID  . levalbuterol  0.63 mg Nebulization TID  . metoprolol tartrate  50 mg Oral BID  . mometasone-formoterol  2 puff Inhalation BID  . predniSONE  40 mg Oral Q breakfast  . sodium chloride flush  3 mL Intravenous Q12H   Continuous Infusions: . sodium chloride    . sodium chloride 10 mL/hr at 02/01/18 0633  . heparin 1,500 Units/hr (02/01/18 0314)   PRN Meds: sodium chloride, acetaminophen **OR** acetaminophen, ondansetron **OR** ondansetron (ZOFRAN) IV, polyethylene glycol, sodium chloride flush   Vital Signs    Vitals:   01/31/18 2038 02/01/18 0601 02/01/18 0716 02/01/18 0717  BP:  (!) 145/95    Pulse: 95 89    Resp: 18 20    Temp:  97.6 F (36.4 C)    TempSrc:  Oral    SpO2: 95% 93% 95% 95%  Weight:  133.9 kg    Height:        Intake/Output Summary (Last 24 hours) at 02/01/2018 0748 Last data filed at 02/01/2018 0316 Gross per 24 hour  Intake 109.57 ml  Output 1700 ml  Net -1590.43 ml   Filed Weights   01/30/18 0547 01/31/18 0306 02/01/18 0601  Weight: (!) 136.2 kg 134.6 kg 133.9 kg    Telemetry    Atrial fibrillation with CVR- Personally Reviewed  ECG    No new EKG to review- Personally Reviewed  Physical Exam   GEN: Well nourished, well developed in no acute distress HEENT: Normal NECK: No JVD; No carotid bruits LYMPHATICS: No lymphadenopathy CARDIAC:RRR, no murmurs, rubs, gallops RESPIRATORY:  Clear to auscultation without rales, wheezing or rhonchi  ABDOMEN: Soft, non-tender, non-distended MUSCULOSKELETAL:  No edema; No  deformity  SKIN: Warm and dry NEUROLOGIC:  Alert and oriented x 3 PSYCHIATRIC:  Normal affect    Labs    Chemistry Recent Labs  Lab 01/30/18 0658 01/31/18 0303 01/31/18 1952  NA 141 141 136  K 4.1 4.6 4.7  CL 103 101 100  CO2 27 29 28   GLUCOSE 150* 132* 137*  BUN 30* 38* 39*  CREATININE 1.48* 1.83* 1.75*  CALCIUM 8.7* 8.8* 8.3*  GFRNONAA 43* 33* 35*  GFRAA 50* 38* 41*  ANIONGAP 11 11 8      Hematology Recent Labs  Lab 01/28/18 1611 01/31/18 0303 02/01/18 0539  WBC 7.4 15.9* 11.4*  RBC 3.76* 3.97* 3.75*  HGB 11.4* 11.9* 11.4*  HCT 36.9* 37.6* 35.7*  MCV 98.1 94.7 95.2  MCH 30.3 30.0 30.4  MCHC 30.9 31.6 31.9  RDW 16.6* 16.0* 16.1*  PLT 158 181 168    Cardiac Enzymes Recent Labs  Lab 01/28/18 2039 01/29/18 0419 01/29/18 0936  TROPONINI 0.10* 0.09* 0.09*    Recent Labs  Lab 01/28/18 1614  TROPIPOC 0.07     BNP Recent Labs  Lab 01/28/18 2039  BNP 1,523.4*     DDimer No results for input(s): DDIMER in the last 168 hours.   Radiology    Ct Head Wo Contrast  Result Date: 01/30/2018 CLINICAL DATA:  Brief LEFT visual field cut 6 weeks ago. Evaluate amaurosis fugax. History of carotid artery disease, hypertension, hyperlipidemia. EXAM: CT HEAD WITHOUT CONTRAST TECHNIQUE: Contiguous axial images were obtained from the base of the skull through the vertex without intravenous contrast. COMPARISON:  None. FINDINGS: BRAIN: No intraparenchymal hemorrhage, mass effect nor midline shift. Moderate to severe global parenchymal brain volume loss, moderate lobulated ventricular margin with mild sulcal effacement at the convexities. No hydrocephalus. Patchy supratentorial white matter hypodensities within normal range for patient's age, though non-specific are most compatible with chronic small vessel ischemic disease. No acute large vascular territory infarcts. No abnormal extra-axial fluid collections. Basal cisterns are patent. VASCULAR: Mild calcific atherosclerosis  of the carotid siphons. SKULL: No skull fracture. No significant scalp soft tissue swelling. SINUSES/ORBITS: Trace paranasal sinus mucosal thickening. Subcentimeter frontoethmoidal osteomas. Mastoid air cells are well aerated.The included ocular globes and orbital contents are non-suspicious. Status post bilateral ocular lens implants. OTHER: None. IMPRESSION: 1. No acute intracranial process. 2. Moderate very to severe parenchymal brain volume loss with suspected component of normal pressure hydrocephalus. 3. Moderate chronic small vessel ischemic changes. Electronically Signed   By: Elon Alas M.D.   On: 01/30/2018 16:54    Cardiac Studies   2D echo 01/29/2018 Study Conclusions  - Left ventricle: The cavity size was normal. Systolic function was   mildly to moderately reduced. The estimated ejection fraction was   in the range of 40% to 45%. Diffuse hypokinesis. There is   akinesis of the inferior myocardium. - Ventricular septum: The contour showed diastolic flattening. - Aortic valve: Valve mobility was restricted. There was trivial   regurgitation. - Aorta: Ascending aortic diameter: 41 mm (S). - Ascending aorta: The ascending aorta was mildly dilated. - Mitral valve: Calcified annulus. There was mild regurgitation. - Right ventricle: The cavity size was severely dilated. Wall   thickness was normal. Systolic function was moderately reduced. - Right atrium: The atrium was severely dilated. - Pulmonary arteries: Systolic pressure was moderately increased.   PA peak pressure: 60 mm Hg (S).  Patient Profile     82 y.o. male with a hx of permanent atrial fibrillation (prior DCCV with recurrence, managed rate control), HTN, HLD, BPH, GERD, COPD, bladder cancer 02/2017 (resection/chemo), ?CKD stage III (Cr 1.25 in 11/2017), recent visual changes who is being seen  for the evaluation of SOB/decreased EF at the request of Dr. Tyrell Antonio.  Assessment & Plan    1.  Acute on chronic  combined systolic and diastolic CHF -He says his weight is usually 290 pounds but the last time he saw Dr. Camillia Herter office he was 305 pounds.   -He was 310 pounds on admission and is down 6 kg from admission. He is down 1kg from yesterday and weight today is 295lbs so nearing his baseline weight -He put out 1.7 L yesterday and is net -6.8 L -He is still volume overloaded with lower extremity edema. -Baseline creatinine appears to be around 1.16-1.25 -creatinine bumped to 1.75 yesterday so diuretics held -BMET pending this am -plan for right and left heart cath today if renal function improved  2.  Permanent atrial fibrillation -Heart rate controlled in the 80's -DuoNeb changed to Xopenex -Continue on  Lopressor 50 mg twice daily and IV Heparin gtt -Xareltoon hold for cath today  3.  Acute on chronic kidney disease stage III -Baseline creatinine seems to be around 1.16-1.25 -Creatinine bumped yesterday to 1.83 -BMET pending this am - off  diuretics currently  4.  Hypertension -BP is borderline controlled at 145/68mmHg this am (was well controlled throughout the day yesterday) -Continue Lopressor 50 mg twice daily -Holding ACE and diuretic due to worsening renal function. -continue to follow - may need to add amlodipine if BP remains high off ACE  5. Pulmonary hypertension -Echo shows worsening pulmonary hypertension with PASP 60 mmHg. -likely a combination of group 2 (pulmonary venous hypertension from CHF) and group 3 from COPD with exacerbation -Continue nebulizers  -Diuretics on hold for worsening renal function -Given worsening RV systolic function and RV enlargement as well as worsening pulmonary hypertension we will plan right heart cath at the time of left heart cath   6.  Elevated troponin -Minimally elevated with flat trend at 0.1>0.09>0.09 -Likely demand ischemia in the setting of COPD exacerbation, chronic kidney disease and CHF -2D echo does show worsening LV  function with EF now 40 to 45% with a new inferior wall motion abnormality which corresponds to Q waves in the inferior leads on EKG. -Troponin only minimally elevated with flat trend and could be consistent with demand ischemia in the setting of COPD exacerbation but given declining EF with new wall motion abormality -Declining renal function may prohibit left heart catheterization.   -diuretics and ACE I are on hold and BMET this am pending -If creatinine remains high then could consider nuclear stress test to rule out ischemia given flat trend of troponin and then get right heart cath to assess filling pressures if no ischemia on stress test -Continue IV heparin drip, beta-blocker and add aspirin 81 mg daily. -He is statin intolerant    I have spent a total of 35 minutes with patient reviewing hospital notes, echo , telemetry, EKGs, labs and examining patient as well as establishing an assessment and plan that was discussed with the patient.  > 50% of time was spent in direct patient care.    For questions or updates, please contact [place] Please consult [email or web address] for contact info under Cardiology/STEMI.      Signed, Fransico Him, MD  02/01/2018, 7:48 AM

## 2018-02-01 NOTE — Progress Notes (Signed)
Dr Algis Liming in to see pt and pt asking about cathtoday, Dr Algis Liming reports kidney function improved and to page MD to advise plan, Paged NP for Dr Meda Coffee cardiology

## 2018-02-01 NOTE — Progress Notes (Addendum)
PROGRESS NOTE    John Stark  IOX:[id number] DOB: February 15, 1936 DOA: 01/28/2018 PCP: Alroy Dust, L.Marlou Sa, MD    Brief Narrative: John Stark is a 82 y.o. male with medical history significant for CHF, HTN,  HLD, GERD, COPD, stage III chronic kidney disease permanent atrial fibrillation status post prior DCCV with recurrence, presented to the ED with complaints of shortness of breath of 2 weeks duration, 10 pound weight gain and worsening bilateral lower extremity swelling in the context of missing Lasix doses and eating salty food.  Admitted for decompensated CHF.  New cardiomyopathy noted.  Cardiology consulted.  Hospital course complicated by acute on chronic kidney injury.  Lasix discontinued.  Cardiology plans cardiac cath pending improvement in renal function.  Assessment & Plan:   Principal Problem:   Acute exacerbation of CHF (congestive heart failure) (HCC) Active Problems:   Overweight   Essential hypertension   DIASTOLIC HEART FAILURE, CHRONIC   Atrial fibrillation (HCC)   COPD (chronic obstructive pulmonary disease) (HCC)   Acute on chronic combined systolic and diastolic CHF (congestive heart failure) (HCC)   Elevated troponin  Acute on chronic combined CHF Likely precipitated by poor compliance to diuretics and low-salt diet and new cardiomyopathy.  Cardiology consulted and indicate that he has not had prior ischemic evaluation.  He has a wall motion abnormality concerning for possible CAD.  He was diuresed with Lasix IV with improvement.  -6.7 L since admission.  Weight down by 7 kgs since admission.  Lasix discontinued 8/8 due to worsening renal insufficiency.  Xarelto held and heparin per pharmacy started 8/7 in preparation for possible cath.  Aspirin 81 mg daily also started.  Per cardiology, plans for cardiac cath if renal functions acceptable.  Creatinine has improved from 1.7-1.5.  Cardiomyopathy Need to rule out ischemic etiology.  Please see discussion above  regarding ischemic evaluation.  Pulmonary hypertension May be due to CHF and obesity.  As per cardiology, may need right heart cath as well.  Permanent atrial fibrillation Today with controlled ventricular rate.  Xarelto changed to IV heparin pending cardiac cath.  Continue beta-blockers.  Albuterol switched to Xopenex.  TSH normal.  NSVT 6 beat NSVT noted on telemetry on 01/31/2018 at 2:32 PM.  Ischemic evaluation as above.  Continue beta-blockers.  Wheezing/COPD exacerbation versus cardiac asthma versus VCD Patient had mostly upper airway wheezing but not stridor on 8/7.  Otherwise no clinical bronchospasm.  Change IV Solu-Medrol to oral prednisone taper.  Added PPI to Pepcid.  Improving.  Continue management.  Wheezing has almost resolved and has it occasionally with exertion.  Acute on stage III chronic kidney disease: ACEI was discontinued 8/7 given bump in his creatinine.  Baseline creatinine may be in the 1.2 range.  Creatinine has worsened from 1.48-1.83, likely due to diuresis.  IV Lasix discontinued 8/8.  Creatinine has improved from 1.83 yesterday to 1.5.  Essential hypertension Continue metoprolol & doxazosin.  ACEI discontinued.  Mildly uncontrolled and fluctuating.  Morbid obesity/Body mass index is 41.17 kg/m.  Visual changes: As per cardiology, had these 6 weeks ago and suspected amaurosis fugax but stroke felt less likely due to being on Xarelto.  CT head 8/7 showed no acute findings but some concern for NPH which can be evaluated as outpatient.    Anemia Follow CBCs periodically.  Stable.   DVT prophylaxis: Xarelto discontinued and IV heparin started 8/7 Code Status: Full code.  Family Communication: Attempted to reach spouse via phone which rang without an answer.  Will  try again later. Disposition Plan: DC home pending clinical improvement and cardiology evaluation, may take several days.   Consultants:   none   Procedures:   ECHO  Antimicrobials:    Doxycycline discontinued after single dose on 8/5  Subjective: Patient seen this morning prior to procedure.  Was anxious to have procedure/cardiac cath done.  Wheezing significantly improved and only occasionally with exertion associated with mild dyspnea.  No chest pain.  Leg swelling improved.  As per RN, no acute issues noted.  Objective: Vitals:   02/01/18 1021 02/01/18 1047 02/01/18 1136 02/01/18 1527  BP:  (!) 135/106 (!) 131/91   Pulse: (!) 116 95 74   Resp:   20   Temp:   (!) 97.5 F (36.4 C)   TempSrc:   Oral   SpO2: 90% 97% 96% 95%  Weight:      Height:        Intake/Output Summary (Last 24 hours) at 02/01/2018 1541 Last data filed at 02/01/2018 1300 Gross per 24 hour  Intake 509.61 ml  Output 1100 ml  Net -590.39 ml   Filed Weights   01/30/18 0547 01/31/18 0306 02/01/18 0601  Weight: (!) 136.2 kg 134.6 kg 133.9 kg    Examination:  General exam: Pleasant elderly male, moderately built and morbidly obese sitting up comfortably at edge of bed.Marland Kitchen Respiratory system: Clear to auscultation.  No wheezing, rhonchi or crackles.  No increased work of breathing. Cardiovascular system: S1 and S2 heard, irregularly irregular.  No JVD.  Trace bilateral ankle edema.  Telemetry personally reviewed: A. fib with controlled ventricular rate. Gastrointestinal system: Abdomen is nondistended, soft and nontender. No organomegaly or masses felt. Normal bowel sounds heard.  Stable Central nervous system: Alert and oriented. No focal neurological deficits.  Stable Extremities: Symmetric 5 x 5 power. Skin: No rashes, lesions or ulcers   Data Reviewed: I have personally reviewed following labs and imaging studies  CBC: Recent Labs  Lab 01/28/18 1611 01/31/18 0303 02/01/18 0539  WBC 7.4 15.9* 11.4*  HGB 11.4* 11.9* 11.4*  HCT 36.9* 37.6* 35.7*  MCV 98.1 94.7 95.2  PLT 158 181 830   Basic Metabolic Panel: Recent Labs  Lab 01/29/18 0419 01/30/18 0658 01/31/18 0303  01/31/18 1952 02/01/18 0539  NA 143 141 141 136 140  K 4.0 4.1 4.6 4.7 4.5  CL 107 103 101 100 104  CO2 24 27 29 28 27   GLUCOSE 103* 150* 132* 137* 104*  BUN 24* 30* 38* 39* 36*  CREATININE 1.34* 1.48* 1.83* 1.75* 1.55*  CALCIUM 8.7* 8.7* 8.8* 8.3* 8.5*   GFR: Estimated Creatinine Clearance: 53.1 mL/min (A) (by C-G formula based on SCr of 1.55 mg/dL (H)).  Cardiac Enzymes: Recent Labs  Lab 01/28/18 2039 01/29/18 0419 01/29/18 0936  TROPONINI 0.10* 0.09* 0.09*     Radiology Studies: Ct Head Wo Contrast  Result Date: 01/30/2018 CLINICAL DATA:  Brief LEFT visual field cut 6 weeks ago. Evaluate amaurosis fugax. History of carotid artery disease, hypertension, hyperlipidemia. EXAM: CT HEAD WITHOUT CONTRAST TECHNIQUE: Contiguous axial images were obtained from the base of the skull through the vertex without intravenous contrast. COMPARISON:  None. FINDINGS: BRAIN: No intraparenchymal hemorrhage, mass effect nor midline shift. Moderate to severe global parenchymal brain volume loss, moderate lobulated ventricular margin with mild sulcal effacement at the convexities. No hydrocephalus. Patchy supratentorial white matter hypodensities within normal range for patient's age, though non-specific are most compatible with chronic small vessel ischemic disease. No acute large vascular territory infarcts.  No abnormal extra-axial fluid collections. Basal cisterns are patent. VASCULAR: Mild calcific atherosclerosis of the carotid siphons. SKULL: No skull fracture. No significant scalp soft tissue swelling. SINUSES/ORBITS: Trace paranasal sinus mucosal thickening. Subcentimeter frontoethmoidal osteomas. Mastoid air cells are well aerated.The included ocular globes and orbital contents are non-suspicious. Status post bilateral ocular lens implants. OTHER: None. IMPRESSION: 1. No acute intracranial process. 2. Moderate very to severe parenchymal brain volume loss with suspected component of normal pressure  hydrocephalus. 3. Moderate chronic small vessel ischemic changes. Electronically Signed   By: Elon Alas M.D.   On: 01/30/2018 16:54        Scheduled Meds: . [MAR Hold] aspirin EC  81 mg Oral Daily  . [MAR Hold] colesevelam  1,250 mg Oral BID WC  . [MAR Hold] doxazosin  4 mg Oral QHS  . [MAR Hold] famotidine  20 mg Oral Daily  . [MAR Hold] finasteride  5 mg Oral QHS  . [MAR Hold] ipratropium  0.5 mg Nebulization TID  . [MAR Hold] levalbuterol  0.63 mg Nebulization TID  . [MAR Hold] metoprolol tartrate  50 mg Oral BID  . [MAR Hold] mometasone-formoterol  2 puff Inhalation BID  . [MAR Hold] predniSONE  40 mg Oral Q breakfast  . sodium chloride flush  3 mL Intravenous Q12H   Continuous Infusions: . sodium chloride    . sodium chloride 10 mL/hr at 02/01/18 0633  . heparin Stopped (02/01/18 1509)     LOS: 4 days    Time spent: 25 minutes.    Vernell Leep, MD, FACP, [facility]. [facility] Pager [phone]  If 7PM-7AM, please contact night-coverage [email or web address] Password [email or web address] 02/01/2018, 3:41 PM

## 2018-02-01 NOTE — Progress Notes (Signed)
ANTICOAGULATION CONSULT NOTE - Follow Up Consult  Pharmacy Consult for Heparin Indication: CAD  Allergies  Allergen Reactions  . Crestor [Rosuvastatin Calcium] Other (See Comments)    Muscle aches   . Pravastatin Other (See Comments)    Muscle aches   . Simvastatin Other (See Comments)    Muscle aches     Patient Measurements: Height: 5\' 11"  (180.3 cm) Weight: 295 lb 3.2 oz (133.9 kg) IBW/kg (Calculated) : 75.3 Heparin Dosing Weight: 108 kg  Vital Signs: Temp: 97.7 F (36.5 C) (08/09 1949) Temp Source: Oral (08/09 1949) BP: 123/87 (08/09 1949) Pulse Rate: 72 (08/09 1949)  Labs: Recent Labs    01/30/18 1657 01/31/18 0303 01/31/18 1952 02/01/18 0539  HGB  --  11.9*  --  11.4*  HCT  --  37.6*  --  35.7*  PLT  --  181  --  168  APTT 32 86*  --  >200*  HEPARINUNFRC 2.14* 1.30*  --  1.32*  CREATININE  --  1.83* 1.75* 1.55*    Estimated Creatinine Clearance: 53.1 mL/min (A) (by C-G formula based on SCr of 1.55 mg/dL (H)).  Assessment:    82 yr old male on Xarelto prior to admission for atrial fibrillation.  Transitioned to IV heparin on 8/8 for cardiac cath on 02/01/18.   Last Xarelto dose ~6pm on 01/29/18. Using aPTTs for heparin monitoring, since recent Xarelto doses expected to falsely elevate heparin levels.      Now s/p cardiac cath.  IV heparin to resume 2 hrs after TR band off.  Band off ~1940.   No bleeding or hematoma reported.    Initial aPTT was at goal (86 seconds) on 1500 units/hr, but >200 seconds this morning and rate was decreased to 1300 units/hr.     Goal of Therapy:  Heparin level 0.3-0.7 units/ml aPTT 66-102 seconds Monitor platelets by anticoagulation protocol: Yes   Plan:   Resume heparin drip ~9:45pm at 1300 units/hr  aPTT and heparin level ~8 hrs after drip resumes.  Daily aPTT and heparin level until they correlate. Daily CBC.  Xarelto on hold.    Follow up plans.  Arty Baumgartner, [place] Pager: [phone] 02/01/2018,8:22 PM

## 2018-02-01 NOTE — Progress Notes (Signed)
[place] for Heparin Indication: atrial fibrillation  Allergies  Allergen Reactions  . Crestor [Rosuvastatin Calcium] Other (See Comments)    Muscle aches   . Pravastatin Other (See Comments)    Muscle aches   . Simvastatin Other (See Comments)    Muscle aches     Patient Measurements: Height: 5\' 11"  (180.3 cm) Weight: 295 lb 3.2 oz (133.9 kg) IBW/kg (Calculated) : 75.3 Heparin Dosing Weight: 108 kg  Vital Signs: Temp: 97.6 F (36.4 C) (08/09 0601) Temp Source: Oral (08/09 0601) BP: 145/95 (08/09 0601) Pulse Rate: 89 (08/09 0601)  Labs: Recent Labs    01/30/18 1657 01/31/18 0303 01/31/18 1952 02/01/18 0539  HGB  --  11.9*  --  11.4*  HCT  --  37.6*  --  35.7*  PLT  --  181  --  168  APTT 32 86*  --  >200*  HEPARINUNFRC 2.14* 1.30*  --  1.32*  CREATININE  --  1.83* 1.75* 1.55*    Estimated Creatinine Clearance: 53.1 mL/min (A) (by C-G formula based on SCr of 1.55 mg/dL (H)).   Assessment: 82 yr old male on Xarelto prior to admission for atrial fibrillation.  Now onIV heparin.  Planning cardiac cath on 02/01/18. Last Xarelto dose ~6pm on 01/29/18.  -aPTT= elevated this AM  Goal of Therapy:  Heparin level 0.3-0.7 units/ml aPTT 66-102  seconds Monitor platelets by anticoagulation protocol: Yes   Plan:  Decrease heparin to 1300 units / hr until cath Follow up after cath  Thank you Anette Guarneri, PharmD [phone]

## 2018-02-01 NOTE — Progress Notes (Signed)
Physical Therapy Treatment Patient Details Name: John Stark MRN: [id number] DOB: 03/02/1936 Today's Date: 02/01/2018    History of Present Illness 82 y.o. male admitted with CHF and SOB. PMHx: CHF, HTN, atrial fibrillation    PT Comments    Pt pleasant sitting EOB on arrival and eager to walk. Pt reports bil LE neuropathy and decreased functional mobility. Pt with flight of stairs to bedroom and do not feel he would be able to successfully complete those at this time due to fatigue with gait. Pt educated for importance of mobility and walking program and encouraged continued mobility with nursing acutely.   HR 120-126 with gait O2 90% on RA after gait    Follow Up Recommendations  Home health PT;Supervision/Assistance - 24 hour     Equipment Recommendations  None recommended by PT    Recommendations for Other Services       Precautions / Restrictions Precautions Precautions: Fall    Mobility  Bed Mobility               General bed mobility comments: EOB on arrival  Transfers Overall transfer level: Needs assistance   Transfers: Sit to/from Stand Sit to Stand: Min guard         General transfer comment: guarding for safety and lines  Ambulation/Gait Ambulation/Gait assistance: Min guard Gait Distance (Feet): 80 Feet Assistive device: Rolling walker (2 wheeled) Gait Pattern/deviations: Decreased stride length;Wide base of support;Trunk flexed;Step-through pattern   Gait velocity interpretation: <1.8 ft/sec, indicate of risk for recurrent falls General Gait Details: pt walking with slow gait with cues for posture in RW and safety, limited by fatigue with SpO2 90% on rA end of session. Pt with fatigue last 10' of gait requiring min assist   Stairs Stairs: Yes Stairs assistance: Min guard Stair Management: Step to pattern;Forwards;Two rails Number of Stairs: 2     Wheelchair Mobility    Modified Rankin (Stroke Patients Only)       Balance  Overall balance assessment: Needs assistance   Sitting balance-Leahy Scale: Good       Standing balance-Leahy Scale: Poor Standing balance comment: reliance on UE support                            Cognition Arousal/Alertness: Awake/alert Behavior During Therapy: WFL for tasks assessed/performed Overall Cognitive Status: Within Functional Limits for tasks assessed                                        Exercises General Exercises - Lower Extremity Long Arc Quad: AROM;15 reps;Seated;Both Hip Flexion/Marching: AROM;15 reps;Standing;Both    General Comments        Pertinent Vitals/Pain Pain Assessment: No/denies pain    Home Living                      Prior Function            PT Goals (current goals can now be found in the care plan section) Progress towards PT goals: Progressing toward goals    Frequency           PT Plan Current plan remains appropriate    Co-evaluation              AM-PAC PT "6 Clicks" Daily Activity  Outcome Measure  Difficulty turning over in bed (including adjusting  bedclothes, sheets and blankets)?: A Lot Difficulty moving from lying on back to sitting on the side of the bed? : A Lot Difficulty sitting down on and standing up from a chair with arms (e.g., wheelchair, bedside commode, etc,.)?: A Lot Help needed moving to and from a bed to chair (including a wheelchair)?: A Little Help needed walking in hospital room?: A Little Help needed climbing 3-5 steps with a railing? : A Little 6 Click Score: 15    End of Session Equipment Utilized During Treatment: Gait belt Activity Tolerance: Patient limited by fatigue Patient left: with call bell/phone within reach;with family/visitor present;in chair;with chair alarm set Nurse Communication: Mobility status PT Visit Diagnosis: Other abnormalities of gait and mobility (R26.89);Muscle weakness (generalized) (M62.81)     Time: 0600-4599 PT  Time Calculation (min) (ACUTE ONLY): 21 min  Charges:  $Gait Training: 8-22 mins                     Elwyn Reach, [place]    [place] 02/01/2018, 11:45 AM

## 2018-02-01 NOTE — Brief Op Note (Signed)
BRIEF CARDIAC CATHETERIZATION NOTE  02/01/2018  4:45 PM  PATIENT:  John Stark  82 y.o. male  PRE-OPERATIVE DIAGNOSIS:  chest pain  POST-OPERATIVE DIAGNOSIS:  * No post-op diagnosis entered *  PROCEDURE:  Procedure(s): RIGHT/LEFT HEART CATH AND CORONARY ANGIOGRAPHY (N/A)  SURGEON:  Surgeon(s) and Role:    * Teaghan Melrose, MD - Primary  FINDINGS: 1. 2-vessel CAD with 80-90% ostial/proximal LAD stenosis and CTO of the mid RCA. 2. Mildly elevated left heart filling pressure. 3. Moderately elevated right heart filling pressure. 4. Severe pulmonary hypertension. 5. Normal Fick CO/CI.  RECOMMENDATIONS: 1. Equalization of Talon Regala-diastolic pressures with severe pulmonary hypertension suggestive of restrictive cardiomyopathy.  Consider advanced HF consultation for optimization of volume status. 2. Aggressive secondary prevention and medical therapy.  Once he is optimized from a HF standpoint, high risk PCI to the ostial/proximal LAD will need to be considered. 3. Close f/u of renal function.  Nelva Bush, MD [facility] Pager: [phone]

## 2018-02-01 NOTE — Interval H&P Note (Signed)
History and Physical Interval Note:  02/01/2018 3:26 PM  John Stark  has presented today for cardiac catheterization, with the diagnosis of chest pain, heart failure, and cardiomyopathy.  The various methods of treatment have been discussed with the patient and family. After consideration of risks, benefits and other options for treatment, the patient has consented to  Procedure(s): RIGHT/LEFT HEART CATH AND CORONARY ANGIOGRAPHY (N/A) as a surgical intervention .  The patient's history has been reviewed, patient examined, no change in status, stable for surgery.  I have reviewed the patient's chart and labs.  Questions were answered to the patient's satisfaction.    Cath Lab Visit (complete for each Cath Lab visit)  Clinical Evaluation Leading to the Procedure:   ACS: Yes.    Non-ACS:  N/A  Saturnino Liew

## 2018-02-01 NOTE — H&P (View-Only) (Signed)
Progress Note  Patient Name: John Stark Date of Encounter: 02/01/2018  Primary Cardiologist: Lauree Chandler, MD   Subjective   No chest pain.  SOB continues to improve  Inpatient Medications    Scheduled Meds: . aspirin EC  81 mg Oral Daily  . colesevelam  1,250 mg Oral BID WC  . doxazosin  4 mg Oral QHS  . famotidine  20 mg Oral Daily  . finasteride  5 mg Oral QHS  . ipratropium  0.5 mg Nebulization TID  . levalbuterol  0.63 mg Nebulization TID  . metoprolol tartrate  50 mg Oral BID  . mometasone-formoterol  2 puff Inhalation BID  . predniSONE  40 mg Oral Q breakfast  . sodium chloride flush  3 mL Intravenous Q12H   Continuous Infusions: . sodium chloride    . sodium chloride 10 mL/hr at 02/01/18 0633  . heparin 1,500 Units/hr (02/01/18 0314)   PRN Meds: sodium chloride, acetaminophen **OR** acetaminophen, ondansetron **OR** ondansetron (ZOFRAN) IV, polyethylene glycol, sodium chloride flush   Vital Signs    Vitals:   01/31/18 2038 02/01/18 0601 02/01/18 0716 02/01/18 0717  BP:  (!) 145/95    Pulse: 95 89    Resp: 18 20    Temp:  97.6 F (36.4 C)    TempSrc:  Oral    SpO2: 95% 93% 95% 95%  Weight:  133.9 kg    Height:        Intake/Output Summary (Last 24 hours) at 02/01/2018 0748 Last data filed at 02/01/2018 0316 Gross per 24 hour  Intake 109.57 ml  Output 1700 ml  Net -1590.43 ml   Filed Weights   01/30/18 0547 01/31/18 0306 02/01/18 0601  Weight: (!) 136.2 kg 134.6 kg 133.9 kg    Telemetry    Atrial fibrillation with CVR- Personally Reviewed  ECG    No new EKG to review- Personally Reviewed  Physical Exam   GEN: Well nourished, well developed in no acute distress HEENT: Normal NECK: No JVD; No carotid bruits LYMPHATICS: No lymphadenopathy CARDIAC:RRR, no murmurs, rubs, gallops RESPIRATORY:  Clear to auscultation without rales, wheezing or rhonchi  ABDOMEN: Soft, non-tender, non-distended MUSCULOSKELETAL:  No edema; No  deformity  SKIN: Warm and dry NEUROLOGIC:  Alert and oriented x 3 PSYCHIATRIC:  Normal affect    Labs    Chemistry Recent Labs  Lab 01/30/18 0658 01/31/18 0303 01/31/18 1952  NA 141 141 136  K 4.1 4.6 4.7  CL 103 101 100  CO2 27 29 28   GLUCOSE 150* 132* 137*  BUN 30* 38* 39*  CREATININE 1.48* 1.83* 1.75*  CALCIUM 8.7* 8.8* 8.3*  GFRNONAA 43* 33* 35*  GFRAA 50* 38* 41*  ANIONGAP 11 11 8      Hematology Recent Labs  Lab 01/28/18 1611 01/31/18 0303 02/01/18 0539  WBC 7.4 15.9* 11.4*  RBC 3.76* 3.97* 3.75*  HGB 11.4* 11.9* 11.4*  HCT 36.9* 37.6* 35.7*  MCV 98.1 94.7 95.2  MCH 30.3 30.0 30.4  MCHC 30.9 31.6 31.9  RDW 16.6* 16.0* 16.1*  PLT 158 181 168    Cardiac Enzymes Recent Labs  Lab 01/28/18 2039 01/29/18 0419 01/29/18 0936  TROPONINI 0.10* 0.09* 0.09*    Recent Labs  Lab 01/28/18 1614  TROPIPOC 0.07     BNP Recent Labs  Lab 01/28/18 2039  BNP 1,523.4*     DDimer No results for input(s): DDIMER in the last 168 hours.   Radiology    Ct Head Wo Contrast  Result Date: 01/30/2018 CLINICAL DATA:  Brief LEFT visual field cut 6 weeks ago. Evaluate amaurosis fugax. History of carotid artery disease, hypertension, hyperlipidemia. EXAM: CT HEAD WITHOUT CONTRAST TECHNIQUE: Contiguous axial images were obtained from the base of the skull through the vertex without intravenous contrast. COMPARISON:  None. FINDINGS: BRAIN: No intraparenchymal hemorrhage, mass effect nor midline shift. Moderate to severe global parenchymal brain volume loss, moderate lobulated ventricular margin with mild sulcal effacement at the convexities. No hydrocephalus. Patchy supratentorial white matter hypodensities within normal range for patient's age, though non-specific are most compatible with chronic small vessel ischemic disease. No acute large vascular territory infarcts. No abnormal extra-axial fluid collections. Basal cisterns are patent. VASCULAR: Mild calcific atherosclerosis  of the carotid siphons. SKULL: No skull fracture. No significant scalp soft tissue swelling. SINUSES/ORBITS: Trace paranasal sinus mucosal thickening. Subcentimeter frontoethmoidal osteomas. Mastoid air cells are well aerated.The included ocular globes and orbital contents are non-suspicious. Status post bilateral ocular lens implants. OTHER: None. IMPRESSION: 1. No acute intracranial process. 2. Moderate very to severe parenchymal brain volume loss with suspected component of normal pressure hydrocephalus. 3. Moderate chronic small vessel ischemic changes. Electronically Signed   By: Elon Alas M.D.   On: 01/30/2018 16:54    Cardiac Studies   2D echo 01/29/2018 Study Conclusions  - Left ventricle: The cavity size was normal. Systolic function was   mildly to moderately reduced. The estimated ejection fraction was   in the range of 40% to 45%. Diffuse hypokinesis. There is   akinesis of the inferior myocardium. - Ventricular septum: The contour showed diastolic flattening. - Aortic valve: Valve mobility was restricted. There was trivial   regurgitation. - Aorta: Ascending aortic diameter: 41 mm (S). - Ascending aorta: The ascending aorta was mildly dilated. - Mitral valve: Calcified annulus. There was mild regurgitation. - Right ventricle: The cavity size was severely dilated. Wall   thickness was normal. Systolic function was moderately reduced. - Right atrium: The atrium was severely dilated. - Pulmonary arteries: Systolic pressure was moderately increased.   PA peak pressure: 60 mm Hg (S).  Patient Profile     82 y.o. male with a hx of permanent atrial fibrillation (prior DCCV with recurrence, managed rate control), HTN, HLD, BPH, GERD, COPD, bladder cancer 02/2017 (resection/chemo), ?CKD stage III (Cr 1.25 in 11/2017), recent visual changes who is being seen  for the evaluation of SOB/decreased EF at the request of Dr. Tyrell Antonio.  Assessment & Plan    1.  Acute on chronic  combined systolic and diastolic CHF -He says his weight is usually 290 pounds but the last time he saw Dr. Camillia Herter office he was 305 pounds.   -He was 310 pounds on admission and is down 6 kg from admission. He is down 1kg from yesterday and weight today is 295lbs so nearing his baseline weight -He put out 1.7 L yesterday and is net -6.8 L -He is still volume overloaded with lower extremity edema. -Baseline creatinine appears to be around 1.16-1.25 -creatinine bumped to 1.75 yesterday so diuretics held -BMET pending this am -plan for right and left heart cath today if renal function improved  2.  Permanent atrial fibrillation -Heart rate controlled in the 80's -DuoNeb changed to Xopenex -Continue on  Lopressor 50 mg twice daily and IV Heparin gtt -Xareltoon hold for cath today  3.  Acute on chronic kidney disease stage III -Baseline creatinine seems to be around 1.16-1.25 -Creatinine bumped yesterday to 1.83 -BMET pending this am - off  diuretics currently  4.  Hypertension -BP is borderline controlled at 145/10mmHg this am (was well controlled throughout the day yesterday) -Continue Lopressor 50 mg twice daily -Holding ACE and diuretic due to worsening renal function. -continue to follow - may need to add amlodipine if BP remains high off ACE  5. Pulmonary hypertension -Echo shows worsening pulmonary hypertension with PASP 60 mmHg. -likely a combination of group 2 (pulmonary venous hypertension from CHF) and group 3 from COPD with exacerbation -Continue nebulizers  -Diuretics on hold for worsening renal function -Given worsening RV systolic function and RV enlargement as well as worsening pulmonary hypertension we will plan right heart cath at the time of left heart cath   6.  Elevated troponin -Minimally elevated with flat trend at 0.1>0.09>0.09 -Likely demand ischemia in the setting of COPD exacerbation, chronic kidney disease and CHF -2D echo does show worsening LV  function with EF now 40 to 45% with a new inferior wall motion abnormality which corresponds to Q waves in the inferior leads on EKG. -Troponin only minimally elevated with flat trend and could be consistent with demand ischemia in the setting of COPD exacerbation but given declining EF with new wall motion abormality -Declining renal function may prohibit left heart catheterization.   -diuretics and ACE I are on hold and BMET this am pending -If creatinine remains high then could consider nuclear stress test to rule out ischemia given flat trend of troponin and then get right heart cath to assess filling pressures if no ischemia on stress test -Continue IV heparin drip, beta-blocker and add aspirin 81 mg daily. -He is statin intolerant    I have spent a total of 35 minutes with patient reviewing hospital notes, echo , telemetry, EKGs, labs and examining patient as well as establishing an assessment and plan that was discussed with the patient.  > 50% of time was spent in direct patient care.    For questions or updates, please contact [place] Please consult [email or web address] for contact info under Cardiology/STEMI.      Signed, Fransico Him, MD  02/01/2018, 7:48 AM

## 2018-02-02 DIAGNOSIS — I2510 Atherosclerotic heart disease of native coronary artery without angina pectoris: Secondary | ICD-10-CM

## 2018-02-02 DIAGNOSIS — I2721 Secondary pulmonary arterial hypertension: Secondary | ICD-10-CM

## 2018-02-02 LAB — BASIC METABOLIC PANEL
Anion gap: 8 (ref 5–15)
BUN: 33 mg/dL — AB (ref 8–23)
CHLORIDE: 107 mmol/L (ref 98–111)
CO2: 26 mmol/L (ref 22–32)
CREATININE: 1.44 mg/dL — AB (ref 0.61–1.24)
Calcium: 8.4 mg/dL — ABNORMAL LOW (ref 8.9–10.3)
GFR calc Af Amer: 51 mL/min — ABNORMAL LOW (ref >60)
GFR calc non Af Amer: 44 mL/min — ABNORMAL LOW (ref >60)
Glucose, Bld: 90 mg/dL (ref 70–99)
Potassium: 4.6 mmol/L (ref 3.5–5.1)
Sodium: 141 mmol/L (ref 135–145)

## 2018-02-02 LAB — CBC
HCT: 36.3 % — ABNORMAL LOW (ref 39.0–52.0)
Hemoglobin: 11.4 g/dL — ABNORMAL LOW (ref 13.0–17.0)
MCH: 30.3 pg (ref 26.0–34.0)
MCHC: 31.4 g/dL (ref 30.0–36.0)
MCV: 96.5 fL (ref 78.0–100.0)
PLATELETS: 164 10*3/uL (ref 150–400)
RBC: 3.76 MIL/uL — AB (ref 4.22–5.81)
RDW: 16 % — AB (ref 11.5–15.5)
WBC: 9.5 10*3/uL (ref 4.0–10.5)

## 2018-02-02 LAB — APTT: aPTT: 92 seconds — ABNORMAL HIGH (ref 24–36)

## 2018-02-02 LAB — HEPARIN LEVEL (UNFRACTIONATED): Heparin Unfractionated: 0.58 IU/mL (ref 0.30–0.70)

## 2018-02-02 MED ORDER — TORSEMIDE 20 MG PO TABS
40.0000 mg | ORAL_TABLET | Freq: Every day | ORAL | Status: DC
  Administered 2018-02-03: 40 mg via ORAL
  Filled 2018-02-02 (×2): qty 2

## 2018-02-02 MED ORDER — PREDNISONE 20 MG PO TABS
30.0000 mg | ORAL_TABLET | Freq: Every day | ORAL | Status: DC
  Administered 2018-02-03 – 2018-02-04 (×2): 30 mg via ORAL
  Filled 2018-02-02 (×2): qty 1

## 2018-02-02 NOTE — Consult Note (Signed)
Advanced Heart Failure Team Consult Note   Primary Physician: Alroy Dust, L.Marlou Sa, MD PCP-Cardiologist:  Lauree Chandler, MD   Referring MD: T. Turner  Reason for Consultation: HF  HPI:    John Stark is a 82 y.o. male with a hx of morbid obesity, permanent atrial fibrillation (prior DCCV with recurrence, managed rate control), HTN, HLD, BPH, GERD, COPD, bladder cancer 02/2017 (resection/chemo), ?CKD stage III (Cr 1.25 in 11/2017) followed by Dr. Angelena Form.  He was admitted for progressive dyspnea and HF.   About a month ago he began noticing increased dyspnea on exertion with even minimal activity as well as increasing lower extremity edema.  Over the last 2 weeks, this has worsened and he noted an abrupt 10 pound weight gain. He presented to the ER on 8/6 and  findings consistent with congestive heart failure with new cardiomegaly, pulmonary vascular congestion on CXR, and small pleural effusions.   Baseline creatine 1.3.Started lasix 40 IV bid on 8/7 but creatinine bumped from 1.4 -> 1.8 so lasix stopped. No diuretics since 8/7. However weight still down 310->292.Creatinine 1.4 today  2D echo on 8/6 showed EF 45% with akinesis of inferior myocardium, diastolic flatteningmild MR, severely dilated RV, moderately reduced RV function, severely dilated RA, moderately increased PASP of 15mmHg (echo in 2013 did show moderately reduced RV function and PASP of 35mmHg). He is also being treated for AECOPD given wheezing that improves with nebs.  Underwent cath R/L cath on 8/9 which showed severe 2-vessel CAD with 80-90% ostial/proximal LAD stenosis and chronic total occlusion of the mid RCA.There are some discrepancies between automatically generated hemodynamic data and Dr. Darnelle Bos report but it appears he has severe PAH with elevated biventricular pressures with possible equalization of RVEDP and LVEDP.   RA (mean): 18 mmHg RV (S/EDP): 75/20 mmHg PA (S/D, mean): 75/40 (52) mmHg PCWP  (mean): Unable to obtain. LVEDP 20  Ao sat: 96% PA sat: 59%  Fick CO: 5.8 L/min Fick CI: 2.3 L/min/m^2  ABG from cath lab: 7.37/45/83/96%  Says everyone in his family is on CPAP but he has never been tested. Denies known snoring or apneic episodes. Interested in losing weight. Tells me "my appetite is the only thing that works on my body."  Review of Systems: [y] = yes, [ ]  = no   General: Weight gain [ y]; Weight loss [ ] ; Anorexia [ ] ; Fatigue [ y]; Fever [ ] ; Chills [ ] ; Weakness [ ]   Cardiac: Chest pain/pressure [ ] ; Resting SOB Blue.Reese ]; Exertional SOB Blue.Reese ]; Orthopnea [ ] ; Pedal Edema Blue.Reese ]; Palpitations [ ] ; Syncope [ ] ; Presyncope [ ] ; Paroxysmal nocturnal dyspnea[ ]   Pulmonary: Cough [ ] ; Wheezing[ ] ; Hemoptysis[ ] ; Sputum [ ] ; Snoring [ ]   GI: Vomiting[ ] ; Dysphagia[ ] ; Melena[ ] ; Hematochezia [ ] ; Heartburn[ ] ; Abdominal pain [ ] ; Constipation [ ] ; Diarrhea [ ] ; BRBPR [ ]   GU: Hematuria[ ] ; Dysuria [ ] ; Nocturia[ ]   Vascular: Pain in legs with walking [ ] ; Pain in feet with lying flat [ ] ; Non-healing sores [ ] ; Stroke [ ] ; TIA [ ] ; Slurred speech [ ] ;  Neuro: Headaches[ ] ; Vertigo[ ] ; Seizures[ ] ; Paresthesias[ ] ;Blurred vision [ ] ; Diplopia [ ] ; Vision changes [ ]   Ortho/Skin: Arthritis [ y]; Joint pain [ y]; Muscle pain [ ] ; Joint swelling [ ] ; Back Pain [ ] ; Rash [ ]   Psych: Depression[ ] ; Anxiety[ ]   Heme: Bleeding problems [ ] ; Clotting disorders [ ] ; Anemia [ ]   Endocrine: Diabetes [ ] ; Thyroid dysfunction[ ]   Home Medications Prior to Admission medications   Medication Sig Start Date End Date Taking? Authorizing Provider  amLODipine (NORVASC) 5 MG tablet Take 5 mg by mouth daily.    Yes [provider]  benazepril (LOTENSIN) 10 MG tablet Take 20 mg by mouth at bedtime.    Yes [provider]  celecoxib (CELEBREX) 200 MG capsule Take 200 mg by mouth daily as needed for mild pain.    Yes [provider]  doxazosin (CARDURA) 4 MG tablet Take 4 mg  by mouth at bedtime.    Yes [provider]  famotidine (PEPCID) 20 MG tablet Take 1 tablet (20 mg total) by mouth daily. 08/03/17  Yes Burnell Blanks, MD  finasteride (PROSCAR) 5 MG tablet Take 5 mg by mouth at bedtime.    Yes [provider]  Fluticasone-Salmeterol (ADVAIR) 250-50 MCG/DOSE AEPB Inhale 1 puff into the lungs 2 (two) times daily.   Yes [provider]  furosemide (LASIX) 40 MG tablet Take 40 mg by mouth daily.   Yes [provider]  meclizine (ANTIVERT) 25 MG tablet Take 25 mg by mouth 3 (three) times daily as needed for dizziness.   Yes [provider]  metoprolol tartrate (LOPRESSOR) 50 MG tablet Take 1 tablet (50 mg total) by mouth 2 (two) times daily. 08/03/17  Yes Burnell Blanks, MD  Nutritional Supplements (JUICE PLUS FIBRE PO) Take 6 tablets by mouth daily. 2 nuts tablets, 2 fruit tablets, and 2 veggie tablets   Yes [provider]  potassium chloride (MICRO-K) 10 MEQ CR capsule Take 10 mEq by mouth daily.    Yes [provider]  PROAIR HFA 108 (90 Base) MCG/ACT inhaler Inhale 2 puffs into the lungs every 4 (four) hours as needed for shortness of breath or wheezing.  04/20/16  Yes [provider]  vitamin B-12 (CYANOCOBALAMIN) 100 MCG tablet Take 100 mcg by mouth daily.   Yes [provider]  [facility] 625 MG tablet Take 1,250 mg by mouth 2 (two) times daily with a meal.  07/16/13  Yes [provider]  XARELTO 20 MG TABS tablet TAKE 1 TABLET BY MOUTH ONCE DAILY 07/09/17  Yes Burnell Blanks, MD  cyclobenzaprine (FLEXERIL) 5 MG tablet Take 1 tablet (5 mg total) by mouth at bedtime and may repeat dose one time if needed. Patient not taking: Reported on 01/28/2018 12/20/17   Recardo Evangelist, PA-C    Past Medical History: Past Medical History:  Diagnosis Date  . AAA (abdominal aortic aneurysm) ([place])    a. infrarenal abdominal aortic aneurysm by CT 11/2017 4.7 x 3.9 cm  versus 4.4 x 4 1 cm  . Asthma    "as a child"  . Bladder cancer ([place])   . Carotid artery disease ([place])    a. carotid duplex 2019 with minimal plaque, no significant stenosis.  . CKD (chronic kidney disease), stage III ([place])   . COPD (chronic obstructive pulmonary disease) ([place])   . Edema    FEET/LEGS  . Enlarged prostate   . GERD (gastroesophageal reflux disease)   . Hearing loss in right ear   . Hiatal hernia    By CT  . HLD (hyperlipidemia)   . HTN (hypertension)   . Neuropathy   . Osteoarthrosis, unspecified whether generalized or localized, unspecified site   . Overweight(278.02)   . Permanent atrial fibrillation ([place])    a. Xarelto started 05/22/12.  . Right  knee pain    awaiting knee replacement    Past Surgical History: Past Surgical History:  Procedure Laterality Date  . BACK SURGERY     2011  . CARDIOVERSION  06/18/2012   Procedure: CARDIOVERSION;  Surgeon: Josue Hector, MD;  Location: [facility] ENDOSCOPY;  Service: Cardiovascular;  Laterality: N/A;  . CATARACT EXTRACTION W/PHACO Right 09/07/2016   Procedure: CATARACT EXTRACTION PHACO AND INTRAOCULAR LENS PLACEMENT ([place]);  Surgeon: Eulogio Bear, MD;  Location: [facility] ORS;  Service: Ophthalmology;  Laterality: Right;  [place] 42.9 AP% 10.0 CDE 4.52 Fluid pack lot # [id number]  . CATARACT EXTRACTION W/PHACO Left 10/12/2016   Procedure: CATARACT EXTRACTION PHACO AND INTRAOCULAR LENS PLACEMENT ([facility]);  Surgeon: Eulogio Bear, MD;  Location: [facility] ORS;  Service: Ophthalmology;  Laterality: Left;  [place] 34.2 AP% 9.0 CDE 3.17 Fluid pack lot # [id number]  . COLONOSCOPY    . JOINT REPLACEMENT    . SPINAL CORD STIMULATOR INSERTION N/A 02/18/2016   Procedure: LUMBAR SPINAL CORD STIMULATOR INSERTION;  Surgeon: Clydell Hakim, MD;  Location: [place] NEURO ORS;  Service: Neurosurgery;  Laterality: N/A;  LUMBAR SPINAL CORD STIMULATOR INSERTION  . TOTAL KNEE ARTHROPLASTY Right 08/07/2012   Procedure: TOTAL KNEE ARTHROPLASTY;  Surgeon: Kerin Salen, MD;  Location: [place];  Service: Orthopedics;  Laterality: Right;  . TRANSURETHRAL RESECTION OF BLADDER TUMOR N/A 05/04/2017   Procedure: TRANSURETHRAL RESECTION OF BLADDER TUMOR (TURBT)/ INTRAVESICAL CHEMOTHERAPY INSTILLATION;  Surgeon: Kathie Rhodes, MD;  Location: [facility] ORS;  Service: Urology;  Laterality: N/A;    Family History: Family History  Problem Relation Age of Onset  . Cancer Other   . Heart attack Brother 62       had been on O2 for 10 years before this - COPD/tobacco    Social History: Social History   Socioeconomic History  . Marital status: Married    Spouse name: Not on file  . Number of children: 2  . Years of education: Not on file  . Highest education level: Not on file  Occupational History  . Occupation: Probation officer [employer]  Social Needs  . Financial resource strain: Not on file  . Food insecurity:    Worry: Not on file    Inability: Not on file  . Transportation needs:    Medical: Not on file    Non-medical: Not on file  Tobacco Use  . Smoking status: Former Smoker    Packs/day: 1.00    Years: 20.00    Pack years: 20.00    Types: Cigarettes    Last attempt to quit: 05/22/1977    Years since quitting: 40.7  . Smokeless tobacco: Never Used  . Tobacco comment: Quit 1978  Substance and Sexual Activity  . Alcohol use: No  . Drug use: No  . Sexual activity: Not on file  Lifestyle  . Physical activity:    Days per week: Not on file    Minutes per session: Not on file  . Stress: Not on file  Relationships  . Social connections:    Talks on phone: Not on file    Gets together: Not on file    Attends religious service: Not on file    Active member of club or organization: Not on file    Attends meetings of clubs or organizations: Not on file    Relationship status: Not on file  Other Topics Concern  . Not on file  Social History Narrative   Retired. Married. Regularly exercises.  Allergies:  Allergies  Allergen Reactions   . Crestor [Rosuvastatin Calcium] Other (See Comments)    Muscle aches   . Pravastatin Other (See Comments)    Muscle aches   . Simvastatin Other (See Comments)    Muscle aches     Objective:    Vital Signs:   Temp:  [97.7 F (36.5 C)-98 F (36.7 C)] 98 F (36.7 C) (08/10 1220) Pulse Rate:  [53-100] 84 (08/10 1220) Resp:  [14-22] 18 (08/10 1220) BP: (121-180)/(80-134) 131/92 (08/10 1220) SpO2:  [90 %-100 %] 96 % (08/10 1340) Weight:  [132.5 kg] 132.5 kg (08/10 0453) Last BM Date: 02/01/18  Weight change: Filed Weights   01/31/18 0306 02/01/18 0601 02/02/18 0453  Weight: 134.6 kg 133.9 kg 132.5 kg    Intake/Output:   Intake/Output Summary (Last 24 hours) at 02/02/2018 1413 Last data filed at 02/02/2018 0900 Gross per 24 hour  Intake 615.56 ml  Output 550 ml  Net 65.56 ml      Physical Exam    General:  Obese male sitting in chair  No resp difficulty HEENT: normal Neck: supple. JVP hard to see . Carotids 2+ bilat; no bruits. No lymphadenopathy or thyromegaly appreciated. Cor: PMI nondisplaced. Irr IRR distant  Lungs: crackles in bases Abdomen: obese soft, nontender, nondistended. No hepatosplenomegaly. No bruits or masses. Good bowel sounds. Extremities: no cyanosis, clubbing, rash, edema Neuro: alert & orientedx3, cranial nerves grossly intact. moves all 4 extremities w/o difficulty. Affect pleasant   Telemetry   AF 90s Personally reviewed   EKG    AF 97 low volts Personally reviewed   Labs   Basic Metabolic Panel: Recent Labs  Lab 01/30/18 0658 01/31/18 0303 01/31/18 1952 02/01/18 0539 02/02/18 0613  NA 141 141 136 140 141  K 4.1 4.6 4.7 4.5 4.6  CL 103 101 100 104 107  CO2 27 29 28 27 26   GLUCOSE 150* 132* 137* 104* 90  BUN 30* 38* 39* 36* 33*  CREATININE 1.48* 1.83* 1.75* 1.55* 1.44*  CALCIUM 8.7* 8.8* 8.3* 8.5* 8.4*    Liver Function Tests: No results for input(s): AST, ALT, ALKPHOS, BILITOT, PROT, ALBUMIN in the last 168 hours. No  results for input(s): LIPASE, AMYLASE in the last 168 hours. No results for input(s): AMMONIA in the last 168 hours.  CBC: Recent Labs  Lab 01/28/18 1611 01/31/18 0303 02/01/18 0539 02/02/18 0613  WBC 7.4 15.9* 11.4* 9.5  HGB 11.4* 11.9* 11.4* 11.4*  HCT 36.9* 37.6* 35.7* 36.3*  MCV 98.1 94.7 95.2 96.5  PLT 158 181 168 164    Cardiac Enzymes: Recent Labs  Lab 01/28/18 2039 01/29/18 0419 01/29/18 0936  TROPONINI 0.10* 0.09* 0.09*    BNP: BNP (last 3 results) Recent Labs    01/28/18 2039  BNP 1,523.4*    ProBNP (last 3 results) No results for input(s): PROBNP in the last 8760 hours.   CBG: No results for input(s): GLUCAP in the last 168 hours.  Coagulation Studies: No results for input(s): LABPROT, INR in the last 72 hours.   Imaging    No results found.   Medications:     Current Medications: . aspirin EC  81 mg Oral Daily  . colesevelam  1,250 mg Oral BID WC  . doxazosin  4 mg Oral QHS  . famotidine  20 mg Oral Daily  . finasteride  5 mg Oral QHS  . ipratropium  0.5 mg Nebulization TID  . levalbuterol  0.63 mg Nebulization TID  . metoprolol  tartrate  50 mg Oral BID  . mometasone-formoterol  2 puff Inhalation BID  . [START ON 02/03/2018] predniSONE  30 mg Oral Q breakfast  . sodium chloride flush  3 mL Intravenous Q12H     Infusions: . sodium chloride    . heparin 1,300 Units/hr (02/02/18 0203)       Patient Profile   John Stark is a 82 y.o. male with a hx of morbid obesity, permanent atrial fibrillation (prior DCCV with recurrence, managed rate control), HTN, HLD, BPH, GERD, COPD, bladder cancer 02/2017 (resection/chemo), ?CKD stage III (Cr 1.25 in 11/2017) followed by Dr. Angelena Form.   Assessment/Plan   1. Acute on chronic combined systolic and diastolic CHF with R>>L HF symptoms - I have reviewed echo and cath films personally as well as hemodynamic data. - While he likely has a component of restriction, I suspect the main  issue is PAH and RV failure due primarily to OSA/OHS (Group III) - He is now down almost 20 pounds with 2 doses of IV lasix on 8/7 and none since - Could possibly have component of infiltrtive CM but echo not suggestive of this. He is too large for cMRI. Can consider PYP scan - Main treatment here is weight loss, adequate oxygenation and diuresis. Would recommend pulmonary rehab - Will need outatient sleep study - Place overnight oximeter here and check ambulatory sats  - has been on lasix 40mg  daily at home. Will change to torsemide 40 daily and follow closely   2. PAH with cor pulmonale - sed discussion above  3. Permanent atrial fibrillation -Heart rates in the 90s -Continue on Lopressor 50 mg twice daily and IV Heparin gtt -Xarelto on hold for cath today  4. CAD - per general cardiology service - has 2v CAD - would recommend PCI of LAD if a candidate.  - continue ASA. Cannot tolerate statins. Consider PCSK 9   5. Acute on chronic kidney disease stage III -Baseline creatinine seems to be around 1.16-1.25 -Creatinine bumped to 1.83 on admit now back to 1.4. Restart diuretics gently.   6. Hypertension -Per primary team    Length of Stay: 5  Glori Bickers, MD  02/02/2018, 2:13 PM  Advanced Heart Failure Team Pager [phone] (M-F; [place])  Please contact [facility] for night-coverage after hours (4p -7a ) and weekends on [email or web address]

## 2018-02-02 NOTE — Progress Notes (Signed)
Per Dr Haroldine Laws pt to be on continuous pulse ox over night.

## 2018-02-02 NOTE — Progress Notes (Addendum)
ANTICOAGULATION CONSULT NOTE - Follow Up Consult  Pharmacy Consult for Heparin Indication: CAD  Allergies  Allergen Reactions  . Crestor [Rosuvastatin Calcium] Other (See Comments)    Muscle aches   . Pravastatin Other (See Comments)    Muscle aches   . Simvastatin Other (See Comments)    Muscle aches     Patient Measurements: Height: 5\' 11"  (180.3 cm) Weight: 292 lb 3.2 oz (132.5 kg)(scale a) IBW/kg (Calculated) : 75.3 Heparin Dosing Weight: 108 kg  Vital Signs: Temp: 97.7 F (36.5 C) (08/10 0453) Temp Source: Oral (08/10 0453) BP: 143/90 (08/10 0453) Pulse Rate: 99 (08/10 0453)  Labs: Recent Labs    01/30/18 1657  01/31/18 0303 01/31/18 1952 02/01/18 0539 02/02/18 0613  HGB  --    < > 11.9*  --  11.4* 11.4*  HCT  --   --  37.6*  --  35.7* 36.3*  PLT  --   --  181  --  168 164  APTT 32  --  86*  --  >200*  --   HEPARINUNFRC 2.14*  --  1.30*  --  1.32* 0.58  CREATININE  --   --  1.83* 1.75* 1.55*  --    < > = values in this interval not displayed.    Estimated Creatinine Clearance: 52.8 mL/min (A) (by C-G formula based on SCr of 1.55 mg/dL (H)).  Assessment: 82 yr old male on Xarelto prior to admission for atrial fibrillation. Transitioned to IV heparin on 8/8 for cardiac cath on 02/01/18.   Last Xarelto dose ~6pm on 01/29/18. Plans noted for possible PCI to LAD.  -heparin level and aPTT at goal     Goal of Therapy:  Heparin level 0.3-0.7 units/ml aPTT 66-102 seconds Monitor platelets by anticoagulation protocol: Yes   Plan:  -No heparin changes needed -recheck aPTT and heparin level later today -Daily heparin level and CBC  Hildred Laser, PharmD Clinical Pharmacist Please check Amion for pharmacy contact number    02/02/2018,7:24 AM

## 2018-02-02 NOTE — Progress Notes (Addendum)
Progress Note  Patient Name: John Stark Date of Encounter: 02/02/2018  Primary Cardiologist: Lauree Chandler, MD   Subjective   No chest pain.  Shortness of breath better.  Cath yesterday showed severe two-vessel CAD with 89% ostial to proximal LAD stenosis and chronic total occlusion of the mid RCA with left-to-right collaterals.  He was noted to have mildly elevated left heart filling pressures with LVEDP 11 mmHg.  He had moderate pulmonary hypertension with PA mean pressure 46 mmHg (PAP 68/40mmHg) your equalization of pressures with LV EDP 11 mm mmHg and RV diastolic pressure 11 mmHg.  Unable to get a mean PCW pressure to pulmonary hypertension and difficulty and watching the catheter.   Inpatient Medications    Scheduled Meds: . aspirin EC  81 mg Oral Daily  . colesevelam  1,250 mg Oral BID WC  . doxazosin  4 mg Oral QHS  . famotidine  20 mg Oral Daily  . finasteride  5 mg Oral QHS  . ipratropium  0.5 mg Nebulization TID  . levalbuterol  0.63 mg Nebulization TID  . metoprolol tartrate  50 mg Oral BID  . mometasone-formoterol  2 puff Inhalation BID  . predniSONE  40 mg Oral Q breakfast  . sodium chloride flush  3 mL Intravenous Q12H   Continuous Infusions: . sodium chloride    . heparin 1,300 Units/hr (02/02/18 0203)   PRN Meds: sodium chloride, acetaminophen **OR** acetaminophen, ondansetron **OR** ondansetron (ZOFRAN) IV, polyethylene glycol, sodium chloride flush   Vital Signs    Vitals:   02/02/18 0453 02/02/18 0727 02/02/18 0839 02/02/18 0947  BP: (!) 143/90  126/80 (!) 121/92  Pulse: 99  88 93  Resp: 18  20   Temp: 97.7 F (36.5 C)  97.9 F (36.6 C)   TempSrc: Oral  Oral   SpO2: 94% 94% 96%   Weight: 132.5 kg     Height:        Intake/Output Summary (Last 24 hours) at 02/02/2018 1200 Last data filed at 02/02/2018 0900 Gross per 24 hour  Intake 659.38 ml  Output 550 ml  Net 109.38 ml   Filed Weights   01/31/18 0306 02/01/18 0601 02/02/18  0453  Weight: 134.6 kg 133.9 kg 132.5 kg    Telemetry    Atrial fibrillation with CVR- Personally Reviewed  ECG  New EKG to review- Personally Reviewed  Physical Exam   GEN: Well nourished, well developed in no acute distress HEENT: Normal NECK: No JVD; No carotid bruits LYMPHATICS: No lymphadenopathy CARDIAC: irregularly irregular, no murmurs, rubs, gallops RESPIRATORY:  Clear to auscultation without rales, wheezing or rhonchi  ABDOMEN: Soft, non-tender, non-distended MUSCULOSKELETAL:  No edema; No deformity  SKIN: Warm and dry NEUROLOGIC:  Alert and oriented x 3 PSYCHIATRIC:  Normal affect    Labs    Chemistry Recent Labs  Lab 01/31/18 1952 02/01/18 0539 02/02/18 0613  NA 136 140 141  K 4.7 4.5 4.6  CL 100 104 107  CO2 28 27 26   GLUCOSE 137* 104* 90  BUN 39* 36* 33*  CREATININE 1.75* 1.55* 1.44*  CALCIUM 8.3* 8.5* 8.4*  GFRNONAA 35* 41* 44*  GFRAA 41* 47* 51*  ANIONGAP 8 9 8      Hematology Recent Labs  Lab 01/31/18 0303 02/01/18 0539 02/02/18 0613  WBC 15.9* 11.4* 9.5  RBC 3.97* 3.75* 3.76*  HGB 11.9* 11.4* 11.4*  HCT 37.6* 35.7* 36.3*  MCV 94.7 95.2 96.5  MCH 30.0 30.4 30.3  MCHC 31.6 31.9 31.4  RDW 16.0* 16.1* 16.0*  PLT 181 168 164    Cardiac Enzymes Recent Labs  Lab 01/28/18 2039 01/29/18 0419 01/29/18 0936  TROPONINI 0.10* 0.09* 0.09*    Recent Labs  Lab 01/28/18 1614  TROPIPOC 0.07     BNP Recent Labs  Lab 01/28/18 2039  BNP 1,523.4*     DDimer No results for input(s): DDIMER in the last 168 hours.   Radiology    No results found.  Cardiac Studies   2D echo 01/29/2018 Study Conclusions  - Left ventricle: The cavity size was normal. Systolic function was   mildly to moderately reduced. The estimated ejection fraction was   in the range of 40% to 45%. Diffuse hypokinesis. There is   akinesis of the inferior myocardium. - Ventricular septum: The contour showed diastolic flattening. - Aortic valve: Valve mobility  was restricted. There was trivial   regurgitation. - Aorta: Ascending aortic diameter: 41 mm (S). - Ascending aorta: The ascending aorta was mildly dilated. - Mitral valve: Calcified annulus. There was mild regurgitation. - Right ventricle: The cavity size was severely dilated. Wall   thickness was normal. Systolic function was moderately reduced. - Right atrium: The atrium was severely dilated. - Pulmonary arteries: Systolic pressure was moderately increased.   PA peak pressure: 60 mm Hg (S).  Cardiac Cath 02/01/2018 Conclusions: 1. Severe 2-vessel CAD with 80-90% ostial/proximal LAD stenosis and chronic total occlusion of the mid RCA. 2. Mildly elevated left heart filling pressure. 3. Moderately elevated right heart filling pressure. 4. Severe pulmonary hypertension. 5. Low normal Fick CO/CI.  Recommendations: 1. Equalization of end-diastolic pressures with severe pulmonary hypertension suggestive of restrictive cardiomyopathy. Consider advanced HF consultation for optimization of volume status. 2. Aggressive secondary prevention and medical therapy. Once he is optimized from a HF standpoint, high risk PCI to the ostial/proximal LAD will need to be considered. 3. Close follow-up of renal function.  Recommend to resume rivaroxaban, at currently prescribed dose and frequency, after decision regarding PCI to LAD.  If PCI is planned, recommend concurrent antiplatelet therapy of clopidogrel 75mg  daily for 12 months.  Restart heparin infusion 2 hours after TR band removal today.  Patient Profile     82 y.o. male with a hx of permanent atrial fibrillation (prior DCCV with recurrence, managed rate control), HTN, HLD, BPH, GERD, COPD, bladder cancer 02/2017 (resection/chemo), ?CKD stage III (Cr 1.25 in 11/2017), recent visual changes who is being seen  for the evaluation of SOB/decreased EF at the request of Dr. Tyrell Antonio.  Assessment & Plan    1.  Acute on chronic combined systolic and  diastolic CHF -He says his weight is usually 290 pounds but the last time he saw Dr. Camillia Herter office he was 305 pounds.  -He was 310 pounds on admission and is down 8lbs from admission. He is down 1kg from yesterday and weight today is 292lbs so nearing his baseline weight -He put out 750cc yesterday and is net -6.6 L -heart cath showed mildly elevated left heart filling pressures and moderately elevated right heart filling pressures with near equalization of end-diastolic pressures in the setting of severe pulmonary hypertension concerning for restrictive cardiomyopathy -He is still volume overloaded with lower extremity edema. -Baseline creatinine appears to be around 1.16-1.25 -creatinine trending downward (1.75>1.55>1.44) -We will ask advanced heart failure service to evaluate patient to help with optimization of volume status -Consider cardiac MRI given restrictive physiology noted on heart cath  2.  Permanent atrial fibrillation -Heart rate controlled in the  80-90's -DuoNeb changed to Xopenex -Continue on  Lopressor 50 mg twice daily  -Restart Xarelto stop heparin  3.  Acute on chronic kidney disease stage III -Baseline creatinine seems to be around 1.16-1.25 -Creatinine trending downward (1.75>1.55>1.44) -Diuretics currently on hold due to worsening renal function initially  4.  Hypertension -BP controlled today 121/92 mmHg -Continue Lopressor 50 mg twice daily -Holding ACE and diuretic due to worsening renal function. -continue to follow  5. Pulmonary hypertension -Echo shows worsening pulmonary hypertension with PASP 60 mmHg confirmed by cardiac cath -likely a combination of group 2 (pulmonary venous hypertension from CHF) and group 3 from COPD with exacerbation -some concern for restrictive cardiomyopathy based on hemodynamics at right and left heart cath -Continue nebulizers  -Diuretics on hold for worsening renal function -I have asked advanced heart failure to see  patient  6.  Elevated troponin -Minimally elevated with flat trend at 0.1>0.09>0.09 -demand ischemia in the setting of COPD exacerbation, chronic kidney disease, sniffing and ASCHD and CHF -2D echo does show worsening LV function with EF now 40 to 45% with a new inferior wall motion abnormality which corresponds to Q waves in the inferior leads on EKG. -Cath showed very two-vessel CAD with a 90% ostial/proximal LAD stenosis and chronic occluded mid RCA -Discussed cath results with Dr. Saunders Revel who recommended aggressive secondary prevention and medical therapy and once heart failure is optimized if renal function remains stable consider high risk PCI to the ostial/proximal LAD  7.  ASCAD -See cath results  above showing severe two-vessel CAD with 90% ostial/proximal LAD stenosis and chronic occlusion of the mid RCA -Continue aspirin 81 mg daily, Lopressor 50 mg twice daily -Recommend aggressive secondary prevention medical therapy and possible PCI of LAD once heart failure optimized -He is statin intolerant and would recommend lipid clinic evaluation for [facility]  outpatient     I have spent a total of 40 minutes with patient reviewing hospital notes, cath, echo , telemetry, EKGs, labs and examining patient as well as establishing an assessment and plan that was discussed with the patient.  > 50% of time was spent in direct patient care.    For questions or updates, please contact [place] Please consult [email or web address] for contact info under Cardiology/STEMI.      Signed, Fransico Him, MD  02/02/2018, 12:00 PM

## 2018-02-02 NOTE — Progress Notes (Signed)
PROGRESS NOTE    John Stark  IDP:[id number] DOB: 1935-09-26 DOA: 01/28/2018 PCP: Alroy Dust, L.Marlou Sa, MD    Brief Narrative: John Stark is a 82 y.o. male with medical history significant for CHF, HTN,  HLD, GERD, COPD, stage III chronic kidney disease permanent atrial fibrillation status post prior DCCV with recurrence, presented to the ED with complaints of shortness of breath of 2 weeks duration, 10 pound weight gain and worsening bilateral lower extremity swelling in the context of missing Lasix doses and eating salty food.  Admitted for decompensated CHF.  New cardiomyopathy noted.  Cardiology consulted.  Hospital course complicated by acute on chronic kidney injury.  Lasix discontinued.  S/P right and left heart cath and coronary angiography 8/9.  Plans for heart failure optimization and high risk PCI to the ostial/proximal LAD?  8/12 (as per patient's report).  Assessment & Plan:   Principal Problem:   Acute exacerbation of CHF (congestive heart failure) (HCC) Active Problems:   Overweight   Essential hypertension   DIASTOLIC HEART FAILURE, CHRONIC   Atrial fibrillation (HCC)   COPD (chronic obstructive pulmonary disease) (HCC)   Acute on chronic combined systolic and diastolic CHF (congestive heart failure) (HCC)   Elevated troponin  Acute on chronic combined systolic and diastolic CHF CHF Likely precipitated by poor compliance to diuretics and low-salt diet and new cardiomyopathy.  Cardiology consulted and indicate that he has not had prior ischemic evaluation.  He has a wall motion abnormality concerning for possible CAD.  He was diuresed with Lasix IV with improvement.  -6.7 L since admission.  Weight down by 7 kgs since admission.  Lasix discontinued 8/8 due to worsening renal insufficiency.  Xarelto held and heparin per pharmacy started 8/7 precath.  Aspirin 81 mg daily also started.  Underwent right and left heart cath and coronary angiography 02/01/2018 with findings as  below that significantly showed two-vessel CAD with 80-90% ostial/proximal LAD stenosis and CTO of the mid RCA and severe pulmonary hypertension.  Await cardiology follow-up regarding advanced HF consultation & PCI ? 8/12.  Currently remains with mild volume overload but Lasix on hold due to acute kidney injury.  Cardiomyopathy Please see discussion above regarding ischemic evaluation & management.  Pulmonary hypertension Severe pulmonary hypertension by cath 8/9.  Management as above.  Permanent atrial fibrillation Xarelto changed to IV heparin pre cardiac cath.  Continue beta-blockers.  Albuterol switched to Xopenex.  TSH normal.  Controlled ventricular rate.  NSVT 6 beat NSVT noted on telemetry on 01/31/2018 at 2:32 PM.  Ischemic evaluation as above.  Continue beta-blockers.  No further episodes noted.  Wheezing/COPD exacerbation versus cardiac asthma versus VCD Patient had mostly upper airway wheezing but not stridor on 8/7.  Otherwise no clinical bronchospasm.  Change IV Solu-Medrol to oral prednisone taper.  Added PPI to Pepcid.  Improving.  Continue management.  Wheezing has almost resolved and has it occasionally with exertion.  Continue to wean off prednisone.  Acute on stage III chronic kidney disease: ACEI was discontinued 8/7 given bump in his creatinine.  Baseline creatinine may be in the 1.2 range.  Creatinine has worsened from 1.48-1.83, likely due to diuresis.  IV Lasix discontinued 8/8.  Creatinine continues to gradually improve, down to 1.44 on 8/10.  Continue to trend daily BMP.  Essential hypertension Continue metoprolol & doxazosin.  ACEI discontinued.  Mildly uncontrolled and fluctuating.  Morbid obesity/Body mass index is 40.75 kg/m.  Visual changes: As per cardiology, had these 6 weeks ago and suspected  amaurosis fugax but stroke felt less likely due to being on Xarelto.  CT head 8/7 showed no acute findings but some concern for NPH which can be evaluated as  outpatient.    Anemia Follow CBCs periodically.  Stable.   DVT prophylaxis: Xarelto discontinued and IV heparin started 8/7 Code Status: Full code.  Family Communication: Discussed with his spouse, updated care and answered questions. Disposition Plan: DC home pending clinical improvement and cardiology evaluation, may take several days.   Consultants:   none   Procedures:   ECHO  PROCEDURE:  Procedure(s): RIGHT/LEFT HEART CATH AND CORONARY ANGIOGRAPHY (N/A)  SURGEON:  Surgeon(s) and Role:    * End, Christopher, MD - Primary  FINDINGS: 1. 2-vessel CAD with 80-90% ostial/proximal LAD stenosis and CTO of the mid RCA. 2. Mildly elevated left heart filling pressure. 3. Moderately elevated right heart filling pressure. 4. Severe pulmonary hypertension. 5. Normal Fick CO/CI.  RECOMMENDATIONS: 1. Equalization of end-diastolic pressures with severe pulmonary hypertension suggestive of restrictive cardiomyopathy.  Consider advanced HF consultation for optimization of volume status. 2. Aggressive secondary prevention and medical therapy.  Once he is optimized from a HF standpoint, high risk PCI to the ostial/proximal LAD will need to be considered. 3. Close f/u of renal function.  Antimicrobials:  Doxycycline discontinued after single dose on 8/5  Subjective: "I am getting 2 stents on Monday".  Denies wheezing.  Dyspnea improved.  No chest pain.  Talking to friend on the phone.  Objective: Vitals:   02/02/18 0453 02/02/18 0727 02/02/18 0839 02/02/18 0947  BP: (!) 143/90  126/80 (!) 121/92  Pulse: 99  88 93  Resp: 18  20   Temp: 97.7 F (36.5 C)  97.9 F (36.6 C)   TempSrc: Oral  Oral   SpO2: 94% 94% 96%   Weight: 132.5 kg     Height:        Intake/Output Summary (Last 24 hours) at 02/02/2018 1153 Last data filed at 02/02/2018 0900 Gross per 24 hour  Intake 659.38 ml  Output 550 ml  Net 109.38 ml   Filed Weights   01/31/18 0306 02/01/18 0601 02/02/18 0453    Weight: 134.6 kg 133.9 kg 132.5 kg    Examination:  General exam: Pleasant elderly male, moderately built and morbidly obese lying comfortably propped up in bed. Respiratory system: Clear to auscultation.  No increased work of breathing.  Cardiovascular system: S1 and S2 heard, irregularly irregular.  No JVD.  Trace bilateral ankle edema.  Telemetry personally reviewed: A. fib with controlled ventricular rate.  Stable. Gastrointestinal system: Abdomen is nondistended, soft and nontender. No organomegaly or masses felt. Normal bowel sounds heard.  Stable. Central nervous system: Alert and oriented. No focal neurological deficits.  Stable. Extremities: Symmetric 5 x 5 power. Skin: No rashes, lesions or ulcers   Data Reviewed: I have personally reviewed following labs and imaging studies  CBC: Recent Labs  Lab 01/28/18 1611 01/31/18 0303 02/01/18 0539 02/02/18 0613  WBC 7.4 15.9* 11.4* 9.5  HGB 11.4* 11.9* 11.4* 11.4*  HCT 36.9* 37.6* 35.7* 36.3*  MCV 98.1 94.7 95.2 96.5  PLT 158 181 168 425   Basic Metabolic Panel: Recent Labs  Lab 01/30/18 0658 01/31/18 0303 01/31/18 1952 02/01/18 0539 02/02/18 0613  NA 141 141 136 140 141  K 4.1 4.6 4.7 4.5 4.6  CL 103 101 100 104 107  CO2 27 29 28 27 26   GLUCOSE 150* 132* 137* 104* 90  BUN 30* 38* 39* 36* 33*  CREATININE 1.48* 1.83* 1.75* 1.55* 1.44*  CALCIUM 8.7* 8.8* 8.3* 8.5* 8.4*   GFR: Estimated Creatinine Clearance: 56.8 mL/min (A) (by C-G formula based on SCr of 1.44 mg/dL (H)).  Cardiac Enzymes: Recent Labs  Lab 01/28/18 2039 01/29/18 0419 01/29/18 0936  TROPONINI 0.10* 0.09* 0.09*     Radiology Studies: No results found.      Scheduled Meds: . aspirin EC  81 mg Oral Daily  . colesevelam  1,250 mg Oral BID WC  . doxazosin  4 mg Oral QHS  . famotidine  20 mg Oral Daily  . finasteride  5 mg Oral QHS  . ipratropium  0.5 mg Nebulization TID  . levalbuterol  0.63 mg Nebulization TID  . metoprolol tartrate   50 mg Oral BID  . mometasone-formoterol  2 puff Inhalation BID  . predniSONE  40 mg Oral Q breakfast  . sodium chloride flush  3 mL Intravenous Q12H   Continuous Infusions: . sodium chloride    . heparin 1,300 Units/hr (02/02/18 0203)     LOS: 5 days    Time spent: 25 minutes.    Vernell Leep, MD, FACP, [facility]. [facility] Pager [phone]  If 7PM-7AM, please contact night-coverage [email or web address] Password [email or web address] 02/02/2018, 11:53 AM

## 2018-02-02 NOTE — Progress Notes (Signed)
Pt was tachycardic 120bpm but asymptomatic, md notified will continue to monitor.

## 2018-02-03 DIAGNOSIS — J9601 Acute respiratory failure with hypoxia: Secondary | ICD-10-CM

## 2018-02-03 DIAGNOSIS — J441 Chronic obstructive pulmonary disease with (acute) exacerbation: Secondary | ICD-10-CM

## 2018-02-03 LAB — BASIC METABOLIC PANEL
Anion gap: 8 (ref 5–15)
BUN: 34 mg/dL — AB (ref 8–23)
CHLORIDE: 106 mmol/L (ref 98–111)
CO2: 26 mmol/L (ref 22–32)
CREATININE: 1.39 mg/dL — AB (ref 0.61–1.24)
Calcium: 8.2 mg/dL — ABNORMAL LOW (ref 8.9–10.3)
GFR calc Af Amer: 54 mL/min — ABNORMAL LOW (ref >60)
GFR calc non Af Amer: 46 mL/min — ABNORMAL LOW (ref >60)
GLUCOSE: 115 mg/dL — AB (ref 70–99)
Potassium: 4.3 mmol/L (ref 3.5–5.1)
Sodium: 140 mmol/L (ref 135–145)

## 2018-02-03 LAB — APTT: aPTT: 85 seconds — ABNORMAL HIGH (ref 24–36)

## 2018-02-03 LAB — HEPARIN LEVEL (UNFRACTIONATED): Heparin Unfractionated: 0.41 IU/mL (ref 0.30–0.70)

## 2018-02-03 MED ORDER — METOPROLOL SUCCINATE ER 50 MG PO TB24
50.0000 mg | ORAL_TABLET | Freq: Every day | ORAL | Status: DC
  Administered 2018-02-03 – 2018-02-05 (×3): 50 mg via ORAL
  Filled 2018-02-03 (×3): qty 1

## 2018-02-03 MED ORDER — IPRATROPIUM BROMIDE 0.02 % IN SOLN
0.5000 mg | Freq: Two times a day (BID) | RESPIRATORY_TRACT | Status: DC
  Administered 2018-02-04 – 2018-02-05 (×4): 0.5 mg via RESPIRATORY_TRACT
  Filled 2018-02-03 (×4): qty 2.5

## 2018-02-03 MED ORDER — LEVALBUTEROL HCL 0.63 MG/3ML IN NEBU
0.6300 mg | INHALATION_SOLUTION | Freq: Two times a day (BID) | RESPIRATORY_TRACT | Status: DC
  Administered 2018-02-04 – 2018-02-05 (×4): 0.63 mg via RESPIRATORY_TRACT
  Filled 2018-02-03 (×4): qty 3

## 2018-02-03 MED ORDER — FUROSEMIDE 10 MG/ML IJ SOLN
80.0000 mg | Freq: Once | INTRAMUSCULAR | Status: AC
  Administered 2018-02-03: 80 mg via INTRAVENOUS
  Filled 2018-02-03: qty 8

## 2018-02-03 NOTE — Progress Notes (Signed)
Patient ID: John Stark, male   DOB: July 20, 1935, 82 y.o.   MRN: [id number]     Advanced Heart Failure Rounding Note  PCP-Cardiologist: Lauree Chandler, MD   Subjective:    Breathing ok at rest today.  No chest pain.    Objective:   Weight Range: 134.5 kg Body mass index is 41.35 kg/m.   Vital Signs:   Temp:  [97.8 F (36.6 C)-98.5 F (36.9 C)] 97.8 F (36.6 C) (08/11 1144) Pulse Rate:  [80-102] 80 (08/11 1144) Resp:  [18] 18 (08/11 1144) BP: (114-132)/(82-106) 124/96 (08/11 1144) SpO2:  [92 %-98 %] 98 % (08/11 1144) Weight:  [134.5 kg] 134.5 kg (08/11 0500) Last BM Date: 02/03/18(this am per pt)  Weight change: Filed Weights   02/01/18 0601 02/02/18 0453 02/03/18 0500  Weight: 133.9 kg 132.5 kg 134.5 kg    Intake/Output:   Intake/Output Summary (Last 24 hours) at 02/03/2018 1156 Last data filed at 02/03/2018 0900 Gross per 24 hour  Intake 850.15 ml  Output 1201 ml  Net -350.85 ml      Physical Exam    General:  Obese HEENT: Normal Neck: Supple. JVP 12-13 cm. Carotids 2+ bilat; no bruits. No lymphadenopathy or thyromegaly appreciated. Cor: PMI nondisplaced. Irregular rate & rhythm. No rubs, gallops or murmurs. Lungs: Rhonchi bilaterally.  Abdomen: Soft, nontender, nondistended. No hepatosplenomegaly. No bruits or masses. Good bowel sounds. Extremities: No cyanosis, clubbing, rash. 1+ edema left ankle.  Neuro: Alert & orientedx3, cranial nerves grossly intact. moves all 4 extremities w/o difficulty. Affect pleasant   Telemetry   Atrial fibrillation 70s (personally reviewed)  Labs    CBC Recent Labs    02/01/18 0539 02/02/18 0613  WBC 11.4* 9.5  HGB 11.4* 11.4*  HCT 35.7* 36.3*  MCV 95.2 96.5  PLT 168 989   Basic Metabolic Panel Recent Labs    02/02/18 0613 02/03/18 0535  NA 141 140  K 4.6 4.3  CL 107 106  CO2 26 26  GLUCOSE 90 115*  BUN 33* 34*  CREATININE 1.44* 1.39*  CALCIUM 8.4* 8.2*   Liver Function Tests No results  for input(s): AST, ALT, ALKPHOS, BILITOT, PROT, ALBUMIN in the last 72 hours. No results for input(s): LIPASE, AMYLASE in the last 72 hours. Cardiac Enzymes No results for input(s): CKTOTAL, CKMB, CKMBINDEX, TROPONINI in the last 72 hours.  BNP: BNP (last 3 results) Recent Labs    01/28/18 2039  BNP 1,523.4*    ProBNP (last 3 results) No results for input(s): PROBNP in the last 8760 hours.   D-Dimer No results for input(s): DDIMER in the last 72 hours. Hemoglobin A1C No results for input(s): HGBA1C in the last 72 hours. Fasting Lipid Panel No results for input(s): CHOL, HDL, LDLCALC, TRIG, CHOLHDL, LDLDIRECT in the last 72 hours. Thyroid Function Tests No results for input(s): TSH, T4TOTAL, T3FREE, THYROIDAB in the last 72 hours.  Invalid input(s): FREET3  Other results:   Imaging     No results found.   Medications:     Scheduled Medications: . aspirin EC  81 mg Oral Daily  . colesevelam  1,250 mg Oral BID WC  . doxazosin  4 mg Oral QHS  . famotidine  20 mg Oral Daily  . finasteride  5 mg Oral QHS  . furosemide  80 mg Intravenous Once  . ipratropium  0.5 mg Nebulization TID  . levalbuterol  0.63 mg Nebulization TID  . metoprolol succinate  50 mg Oral Daily  . mometasone-formoterol  2 puff Inhalation BID  . predniSONE  30 mg Oral Q breakfast  . sodium chloride flush  3 mL Intravenous Q12H  . torsemide  40 mg Oral Daily     Infusions: . sodium chloride    . heparin 1,300 Units/hr (02/02/18 2344)     PRN Medications:  sodium chloride, acetaminophen **OR** acetaminophen, ondansetron **OR** ondansetron (ZOFRAN) IV, polyethylene glycol, sodium chloride flush    Patient Profile   John Stark a 82 y.o.malewith a hx of morbid obesity, permanent atrial fibrillation (prior DCCV with recurrence, managed rate control), HTN, HLD, BPH, GERD, COPD, bladder cancer 02/2017 (resection/chemo), ?CKD stage III (Cr 1.25 in 11/2017) followed by Dr.  Angelena Form.  Assessment/Plan   1. Acute on chronic combined systolic and diastolic CHF with R>>L HF symptoms:  While he likely has a component of restriction, I suspect the main issue is PAH and RV failure due to COPD and OSA/OHS (Group III PH). Echo with EF 45% with akinesis of inferior myocardium, diastolic flatteningmild MR, severely dilated RV, moderately reduced RV function, severely dilated RA, moderately increased PASP of 37mmHg (echo in 2013 did show moderately reduced RV function and PASP of 80mmHg).  Weight is down considerably here and now on po torsemide.  However, still with JVD on exam.  - Got torsemide this morning, would give Lasix 80 mg IV x 1 this evening then back to torsemide 40 mg daily tomorrow.  - Could possibly have component of infiltrative CMP but echo not suggestive of this. He is too large for cMRI. Will send myeloma panel, can consider PYP scan. - Main treatment here is going to be weight loss, adequate oxygenation and diuresis. Would recommend pulmonary rehab - Will need outatient sleep study - Place overnight oximeter here and check ambulatory sats => suspect he will need home oxygen.  2. PAH with cor pulmonale: As above.   3. Atrial fibrillation: Permanent.  Rate reasonably controlled.  - Transition from metoprolol tartrate to Toprol XL 50 mg daily.  - Xarelto held for possible PCI.   - He is on ASA 81 now, would load with Plavix prior to PCI.  4. CAD: Has CTO mid RCA and 80-90% stenosis in ostial/proximal LAD.  Creatinine has stabilized at 1.39.  I would favor PCI.  - Will ask interventional colleagues to take a look at films tomorrow, hopefully PCI on Tuesday.  - As above, will need Plavix loaded prior to PCI.  Will eventually have to restart Xarelto.  - Cannot tolerate statins, will need referral for lipid clinic for [place].  5. Acute on chronic kidney disease stage III: Baseline creatinine seems to be around 1.16-1.25. Creatinine bumpedto 1.83 on admit now  back to 1.39.   Length of Stay: 6  [name], MD  02/03/2018, 11:56 AM  Advanced Heart Failure Team Pager [phone] (M-F; 7a - 4p)  Please contact [facility] for night-coverage after hours (4p -7a ) and weekends on [email or web address]

## 2018-02-03 NOTE — Progress Notes (Signed)
PROGRESS NOTE    John Stark  ZHY:[id number] DOB: 1935/10/26 DOA: 01/28/2018 PCP: Alroy Dust, L.Marlou Sa, MD    Brief Narrative: John Stark is a 82 y.o. male with medical history significant for CHF, HTN,  HLD, GERD, COPD, stage III chronic kidney disease permanent atrial fibrillation status post prior DCCV with recurrence, presented to the ED with complaints of shortness of breath of 2 weeks duration, 10 pound weight gain and worsening bilateral lower extremity swelling in the context of missing Lasix doses and eating salty food.  Admitted for decompensated CHF.  New cardiomyopathy noted.  Cardiology consulted.  Hospital course complicated by acute on chronic kidney injury.  Lasix discontinued.  S/P right and left heart cath and coronary angiography 8/9.  Cardiology adjusting medications.  Slowly improving.  Assessment & Plan:   Principal Problem:   Acute exacerbation of CHF (congestive heart failure) (HCC) Active Problems:   Overweight   Essential hypertension   DIASTOLIC HEART FAILURE, CHRONIC   Atrial fibrillation (HCC)   COPD (chronic obstructive pulmonary disease) (HCC)   Acute on chronic combined systolic and diastolic CHF (congestive heart failure) (HCC)   Elevated troponin   Coronary artery disease involving native coronary artery of native heart without angina pectoris  Acute on chronic combined systolic and diastolic CHF Likely precipitated by poor compliance to diuretics and low-salt diet and new cardiomyopathy.  He was diuresed with Lasix IV with improvement.  -6.7 L since admission.  Weight down by 7 kgs since admission.  Lasix discontinued 8/8 due to worsening renal insufficiency.  Xarelto held and heparin per pharmacy started 8/7 precath.  Aspirin 81 mg daily also started.  Underwent right and left heart cath and coronary angiography 02/01/2018 with findings as below that significantly showed two-vessel CAD with 80-90% ostial/proximal LAD stenosis and CTO of the mid RCA  and severe pulmonary hypertension.   Advanced HF MD consultation and follow-up appreciated.  They suspect main issue is PAH and RV failure due to COPD and OSA/OHS and may have a component of restriction.  Giving her dose of Lasix 80 mg IV x1 this evening and continue torsemide 40 mg daily that was started 8/10.  Sending myeloma panel and can consider PYP scan.  Cardiomyopathy/CAD As per cardiology follow-up, has CTO mid RCA and 80-90% stenosis in ostial/proximal LAD, creatinine has stabilized and they favor PCI possibly for 8/13.  He will need to be loaded with Plavix prior to PCI and will eventually need to restart Xarelto.  Cannot tolerate statins and outpatient referral to lipid clinic for [place].  Pulmonary hypertension with cor pulmonale Severe pulmonary hypertension by cath 8/9.  Management as above.  Permanent atrial fibrillation Xarelto changed to IV heparin pre cardiac cath.  Continue beta-blockers (metoprolol changed to Toprol-XL 50 mg daily).  Albuterol switched to Xopenex.  TSH normal.  Controlled ventricular rate.  Xarelto continues to be held for possible PCI.  He is now on aspirin 81 mg and cardiology will load with Plavix prior to PCI  NSVT 6 beat NSVT noted on telemetry on 01/31/2018 at 2:32 PM.  Ischemic evaluation as above.  Continue beta-blockers.  No further episodes noted.  Wheezing/COPD exacerbation versus cardiac asthma versus VCD Patient had mostly upper airway wheezing but not stridor on 8/7.  Otherwise no clinical bronchospasm.  Change IV Solu-Medrol to oral prednisone taper.  Added PPI to Pepcid.  Improving.  Bronchospasm is clinically resolved.  Weaning prednisone.  Acute respiratory failure with hypoxia Likely multifactorial related to COPD exacerbation, decompensated  CHF, severe pulmonary hypertension and cor pulmonale, OSA and OHS.  May need home oxygen.  Acute on stage III chronic kidney disease: ACEI was discontinued 8/7 given bump in his creatinine.  Baseline  creatinine may be in the 1.2 range.  Creatinine has worsened from 1.48-1.83, likely due to diuresis.  IV Lasix discontinued 8/8.  Creatinine continues to gradually improve, down to 1.39 on 8/11.  Continue to trend daily BMP.  Essential hypertension Continue metoprolol & doxazosin.  ACEI discontinued.  Mildly uncontrolled and fluctuating.  Morbid obesity/Body mass index is 41.35 kg/m.  Visual changes: As per cardiology, had these 6 weeks ago and suspected amaurosis fugax but stroke felt less likely due to being on Xarelto.  CT head 8/7 showed no acute findings but some concern for NPH which can be evaluated as outpatient.    Anemia Follow CBCs periodically.  Stable.   DVT prophylaxis: Xarelto discontinued and IV heparin started 8/7 Code Status: Full code.  Family Communication: None at bedside today. Disposition Plan: DC home pending clinical improvement and cardiology evaluation, may take several days.   Consultants:   none   Procedures:   ECHO  PROCEDURE:  Procedure(s): RIGHT/LEFT HEART CATH AND CORONARY ANGIOGRAPHY (N/A)  SURGEON:  Surgeon(s) and Role:    * Stark, Christopher, MD - Primary  FINDINGS: 1. 2-vessel CAD with 80-90% ostial/proximal LAD stenosis and CTO of the mid RCA. 2. Mildly elevated left heart filling pressure. 3. Moderately elevated right heart filling pressure. 4. Severe pulmonary hypertension. 5. Normal Fick CO/CI.  RECOMMENDATIONS: 1. Equalization of Stark-diastolic pressures with severe pulmonary hypertension suggestive of restrictive cardiomyopathy.  Consider advanced HF consultation for optimization of volume status. 2. Aggressive secondary prevention and medical therapy.  Once he is optimized from a HF standpoint, high risk PCI to the ostial/proximal LAD will need to be considered. 3. Close f/u of renal function.  Antimicrobials:  Doxycycline discontinued after single dose on 8/5  Subjective: Denies complaints.  No chest pain or dyspnea.   Some dyspnea on exertion.  No wheezing.  Objective: Vitals:   02/03/18 0841 02/03/18 0844 02/03/18 0922 02/03/18 1144  BP:   114/82 (!) 124/96  Pulse:   92 80  Resp:    18  Temp:    97.8 F (36.6 C)  TempSrc:    Oral  SpO2: 92% 92%  98%  Weight:      Height:        Intake/Output Summary (Last 24 hours) at 02/03/2018 1308 Last data filed at 02/03/2018 0900 Gross per 24 hour  Intake 610.15 ml  Output 601 ml  Net 9.15 ml   Filed Weights   02/01/18 0601 02/02/18 0453 02/03/18 0500  Weight: 133.9 kg 132.5 kg 134.5 kg    Examination:  General exam: Pleasant elderly male, moderately built and morbidly obese sitting up comfortably in bed. Respiratory system: Clear to auscultation.  No increased work of breathing.  Stable Cardiovascular system: S1 and S2 heard, irregularly irregular.  No JVD.  Trace bilateral ankle edema.  Telemetry personally reviewed: A. fib with controlled ventricular rate.  Stable Gastrointestinal system: Abdomen is nondistended, soft and nontender. No organomegaly or masses felt. Normal bowel sounds heard.  Stable Central nervous system: Alert and oriented. No focal neurological deficits.  Stable Extremities: Symmetric 5 x 5 power. Skin: No rashes, lesions or ulcers   Data Reviewed: I have personally reviewed following labs and imaging studies  CBC: Recent Labs  Lab 01/28/18 1611 01/31/18 0303 02/01/18 0539 02/02/18 8850  WBC 7.4 15.9* 11.4* 9.5  HGB 11.4* 11.9* 11.4* 11.4*  HCT 36.9* 37.6* 35.7* 36.3*  MCV 98.1 94.7 95.2 96.5  PLT 158 181 168 151   Basic Metabolic Panel: Recent Labs  Lab 01/31/18 0303 01/31/18 1952 02/01/18 0539 02/02/18 0613 02/03/18 0535  NA 141 136 140 141 140  K 4.6 4.7 4.5 4.6 4.3  CL 101 100 104 107 106  CO2 29 28 27 26 26   GLUCOSE 132* 137* 104* 90 115*  BUN 38* 39* 36* 33* 34*  CREATININE 1.83* 1.75* 1.55* 1.44* 1.39*  CALCIUM 8.8* 8.3* 8.5* 8.4* 8.2*   GFR: Estimated Creatinine Clearance: 59.4 mL/min (A) (by  C-G formula based on SCr of 1.39 mg/dL (H)).  Cardiac Enzymes: Recent Labs  Lab 01/28/18 2039 01/29/18 0419 01/29/18 0936  TROPONINI 0.10* 0.09* 0.09*     Radiology Studies: No results found.      Scheduled Meds: . aspirin EC  81 mg Oral Daily  . colesevelam  1,250 mg Oral BID WC  . doxazosin  4 mg Oral QHS  . famotidine  20 mg Oral Daily  . finasteride  5 mg Oral QHS  . furosemide  80 mg Intravenous Once  . ipratropium  0.5 mg Nebulization TID  . levalbuterol  0.63 mg Nebulization TID  . metoprolol succinate  50 mg Oral Daily  . mometasone-formoterol  2 puff Inhalation BID  . predniSONE  30 mg Oral Q breakfast  . sodium chloride flush  3 mL Intravenous Q12H  . torsemide  40 mg Oral Daily   Continuous Infusions: . sodium chloride    . heparin 1,300 Units/hr (02/02/18 2344)     LOS: 6 days    Time spent: 25 minutes.    Vernell Leep, MD, FACP, [facility]. [facility] Pager [phone]  If 7PM-7AM, please contact night-coverage [email or web address] Password [email or web address] 02/03/2018, 1:08 PM

## 2018-02-03 NOTE — Progress Notes (Addendum)
ANTICOAGULATION CONSULT NOTE - Follow Up Consult  Pharmacy Consult for Heparin Indication: CAD  Allergies  Allergen Reactions  . Crestor [Rosuvastatin Calcium] Other (See Comments)    Muscle aches   . Pravastatin Other (See Comments)    Muscle aches   . Simvastatin Other (See Comments)    Muscle aches     Patient Measurements: Height: 5\' 11"  (180.3 cm) Weight: 296 lb 8 oz (134.5 kg) IBW/kg (Calculated) : 75.3 Heparin Dosing Weight: 108 kg  Vital Signs: Temp: 97.8 F (36.6 C) (08/11 0448) Temp Source: Oral (08/11 0448) BP: 132/106 (08/11 0448) Pulse Rate: 102 (08/11 0448)  Labs: Recent Labs    02/01/18 0539 02/02/18 0613 02/03/18 0535  HGB 11.4* 11.4*  --   HCT 35.7* 36.3*  --   PLT 168 164  --   APTT >200* 92* 85*  HEPARINUNFRC 1.32* 0.58 0.41  CREATININE 1.55* 1.44* 1.39*    Estimated Creatinine Clearance: 59.4 mL/min (A) (by C-G formula based on SCr of 1.39 mg/dL (H)).  Assessment: 82 yr old male on Xarelto prior to admission for atrial fibrillation. Transitioned to IV heparin on 8/8 for cardiac cath on 02/01/18.   Last Xarelto dose ~6pm on 01/29/18. Plans noted for possible PCI to LAD.   Heparin level therapeutic at 0.41 on 1300 units/hr. CBC stable, plts nml, no reports of bleeding. Renal function improving.      Goal of Therapy:   Heparin level 0.3-0.7 units/ml aPTT 66-102 seconds Monitor platelets by anticoagulation protocol: Yes   Plan:  -Continue heparin 1300 units / hr  -Monitor daily heparin level, CBC, and s/sx bleeding -F/u plans for anticoagulation at discharge   Juanell Fairly, PharmD PGY1 Pharmacy Resident Phone [phone] 02/03/2018 8:30 AM   I discussed / reviewed the pharmacy note by Dr. Wilkie Aye and I agree with the resident's findings and plans as documented.  Mariafernanda Hendricksen S. Alford Highland, PharmD, [place] Clinical Staff Pharmacist Pager [phone]

## 2018-02-04 ENCOUNTER — Encounter (HOSPITAL_COMMUNITY): Payer: Self-pay | Admitting: Internal Medicine

## 2018-02-04 DIAGNOSIS — I2781 Cor pulmonale (chronic): Secondary | ICD-10-CM

## 2018-02-04 DIAGNOSIS — I509 Heart failure, unspecified: Secondary | ICD-10-CM

## 2018-02-04 LAB — CBC WITH DIFFERENTIAL/PLATELET
Abs Immature Granulocytes: 0.1 10*3/uL (ref 0.0–0.1)
Basophils Absolute: 0 10*3/uL (ref 0.0–0.1)
Basophils Relative: 0 %
EOS ABS: 0 10*3/uL (ref 0.0–0.7)
Eosinophils Relative: 0 %
HEMATOCRIT: 38.8 % — AB (ref 39.0–52.0)
HEMOGLOBIN: 12.4 g/dL — AB (ref 13.0–17.0)
Immature Granulocytes: 1 %
LYMPHS ABS: 1 10*3/uL (ref 0.7–4.0)
LYMPHS PCT: 8 %
MCH: 30 pg (ref 26.0–34.0)
MCHC: 32 g/dL (ref 30.0–36.0)
MCV: 93.7 fL (ref 78.0–100.0)
Monocytes Absolute: 1.1 10*3/uL — ABNORMAL HIGH (ref 0.1–1.0)
Monocytes Relative: 9 %
Neutro Abs: 10.1 10*3/uL — ABNORMAL HIGH (ref 1.7–7.7)
Neutrophils Relative %: 82 %
Platelets: 160 10*3/uL (ref 150–400)
RBC: 4.14 MIL/uL — ABNORMAL LOW (ref 4.22–5.81)
RDW: 15.6 % — AB (ref 11.5–15.5)
WBC: 12.4 10*3/uL — AB (ref 4.0–10.5)

## 2018-02-04 LAB — BASIC METABOLIC PANEL
ANION GAP: 9 (ref 5–15)
BUN: 37 mg/dL — ABNORMAL HIGH (ref 8–23)
CO2: 30 mmol/L (ref 22–32)
CREATININE: 1.52 mg/dL — AB (ref 0.61–1.24)
Calcium: 8.9 mg/dL (ref 8.9–10.3)
Chloride: 99 mmol/L (ref 98–111)
GFR calc Af Amer: 48 mL/min — ABNORMAL LOW (ref >60)
GFR, EST NON AFRICAN AMERICAN: 42 mL/min — AB (ref >60)
GLUCOSE: 101 mg/dL — AB (ref 70–99)
Potassium: 4.4 mmol/L (ref 3.5–5.1)
Sodium: 138 mmol/L (ref 135–145)

## 2018-02-04 LAB — HEPARIN LEVEL (UNFRACTIONATED): Heparin Unfractionated: 0.48 IU/mL (ref 0.30–0.70)

## 2018-02-04 LAB — APTT: aPTT: 79 seconds — ABNORMAL HIGH (ref 24–36)

## 2018-02-04 MED ORDER — PREDNISONE 20 MG PO TABS
20.0000 mg | ORAL_TABLET | Freq: Every day | ORAL | Status: DC
  Administered 2018-02-05 – 2018-02-08 (×4): 20 mg via ORAL
  Filled 2018-02-04 (×4): qty 1

## 2018-02-04 MED ORDER — TORSEMIDE 20 MG PO TABS
40.0000 mg | ORAL_TABLET | Freq: Every day | ORAL | Status: DC
  Administered 2018-02-05 – 2018-02-08 (×4): 40 mg via ORAL
  Filled 2018-02-04 (×4): qty 2

## 2018-02-04 MED ORDER — ASPIRIN 81 MG PO CHEW
81.0000 mg | CHEWABLE_TABLET | ORAL | Status: AC
  Administered 2018-02-05: 81 mg via ORAL
  Filled 2018-02-04: qty 1

## 2018-02-04 MED ORDER — SODIUM CHLORIDE 0.9 % IV SOLN
250.0000 mL | INTRAVENOUS | Status: DC | PRN
Start: 2018-02-04 — End: 2018-02-05

## 2018-02-04 MED ORDER — SODIUM CHLORIDE 0.9 % IV SOLN
INTRAVENOUS | Status: DC
  Administered 2018-02-05: 05:00:00 via INTRAVENOUS

## 2018-02-04 MED ORDER — SODIUM CHLORIDE 0.9% FLUSH
3.0000 mL | INTRAVENOUS | Status: DC | PRN
Start: 2018-02-04 — End: 2018-02-05

## 2018-02-04 MED ORDER — SODIUM CHLORIDE 0.9% FLUSH
3.0000 mL | Freq: Two times a day (BID) | INTRAVENOUS | Status: DC
  Administered 2018-02-05 (×2): 3 mL via INTRAVENOUS

## 2018-02-04 NOTE — Plan of Care (Signed)
No complaints of pain, pt ambulate from bed to chair with minimal assistance.

## 2018-02-04 NOTE — Progress Notes (Addendum)
ANTICOAGULATION CONSULT NOTE - Follow Up Consult  Pharmacy Consult for Heparin Indication: CAD  Allergies  Allergen Reactions  . Crestor [Rosuvastatin Calcium] Other (See Comments)    Muscle aches   . Pravastatin Other (See Comments)    Muscle aches   . Simvastatin Other (See Comments)    Muscle aches     Patient Measurements: Height: 5\' 11"  (180.3 cm) Weight: 287 lb 8 oz (130.4 kg)(a) IBW/kg (Calculated) : 75.3 Heparin Dosing Weight: 108 kg  Vital Signs: Temp: 97.7 F (36.5 C) (08/12 0411) Temp Source: Oral (08/12 0411) BP: 123/83 (08/12 0411) Pulse Rate: 75 (08/12 0411)  Labs: Recent Labs    02/02/18 0613 02/03/18 0535 02/04/18 0339  HGB 11.4*  --  12.4*  HCT 36.3*  --  38.8*  PLT 164  --  160  APTT 92* 85* 79*  HEPARINUNFRC 0.58 0.41 0.48  CREATININE 1.44* 1.39* 1.52*    Estimated Creatinine Clearance: 53.3 mL/min (A) (by C-G formula based on SCr of 1.52 mg/dL (H)).  Assessment:  Anticoag: Afib on Xarelto PTA. Stopped Xarelto (LD 8/6 1753), begin IV heparin for afib. Needs more diuresis then cath.  Heparin level 0.48, aptt 79. Hgb 12.4 improved. Plts stable.     Goal of Therapy:   Heparin level 0.3-0.7 units/ml aPTT 66-102 seconds Monitor platelets by anticoagulation protocol: Yes   Plan:  -Continue heparin 1300 units / hr  -Monitor daily heparin level, CBC, and s/sx bleeding   Gaskill Hasty S. Alford Highland, PharmD, [facility] Clinical Staff Pharmacist Pager [phone]  02/04/2018 7:36 AM

## 2018-02-04 NOTE — Progress Notes (Signed)
Physical Therapy Treatment Patient Details Name: John Stark MRN: [id number] DOB: [birth year] Today's Date: 02/04/2018    History of Present Illness 82 y.o. male admitted with CHF and SOB. PMHx: CHF, HTN, atrial fibrillation    PT Comments    Pt asleep on arrival, but pleasant and agreeable to therapy. At rest pt HR 62, SpO2 92-96. Pt progressed to two trials of ambulation with two standing breaks and one 3 minute sitting break. Pt educated prior to treatment for pursed lip breathing, with verbal cues throughout ambulation to practice as SOB increased. Pt SpO2 during ambulation ranged 88-96, with quick recovery after pursed lip breathing. Pt reported that it would not be feasible to turn a downstairs room into a bedroom, but did have a recliner could nap in. Pt has full flight of stairs to reach bedroom, and at this time is not able to ascend safely and independently. Pt is not open to SNF at this time.      Follow Up Recommendations  Home health PT;Supervision/Assistance - 24 hour;SNF(pt needs to ascend a flight of stairs for home)     Equipment Recommendations  None recommended by PT    Recommendations for Other Services       Precautions / Restrictions Precautions Precautions: Fall    Mobility  Bed Mobility Overal bed mobility: Needs Assistance Bed Mobility: Supine to Sit     Supine to sit: Supervision;HOB elevated     General bed mobility comments: use of rail with increased time and effort  Transfers Overall transfer level: Needs assistance   Transfers: Sit to/from Stand Sit to Stand: Min guard;Min assist         General transfer comment: min assist to rise from bed, minguard from recliner  Ambulation/Gait Ambulation/Gait assistance: Min guard Gait Distance (Feet): 140 Feet Assistive device: Rolling walker (2 wheeled) Gait Pattern/deviations: Decreased stride length;Wide base of support;Trunk flexed;Step-through pattern   Gait velocity interpretation:  <1.8 ft/sec, indicate of risk for recurrent falls General Gait Details: pt walking with slow gait with cues for posture in RW and safety, chair follow with pt able to sit after 140' then another trial of 115' with cues for breathing technique throughout   Stairs             Wheelchair Mobility    Modified Rankin (Stroke Patients Only)       Balance Overall balance assessment: Needs assistance   Sitting balance-Leahy Scale: Good       Standing balance-Leahy Scale: Poor Standing balance comment: reliance on UE support                            Cognition Arousal/Alertness: Awake/alert Behavior During Therapy: WFL for tasks assessed/performed Overall Cognitive Status: Within Functional Limits for tasks assessed                                        Exercises      General Comments        Pertinent Vitals/Pain Pain Assessment: No/denies pain    Home Living                      Prior Function            PT Goals (current goals can now be found in the care plan section) Progress towards PT goals: Progressing toward  goals    Frequency           PT Plan Discharge plan needs to be updated    Co-evaluation              AM-PAC PT "6 Clicks" Daily Activity  Outcome Measure  Difficulty turning over in bed (including adjusting bedclothes, sheets and blankets)?: A Lot Difficulty moving from lying on back to sitting on the side of the bed? : A Lot Difficulty sitting down on and standing up from a chair with arms (e.g., wheelchair, bedside commode, etc,.)?: Unable Help needed moving to and from a bed to chair (including a wheelchair)?: A Little Help needed walking in hospital room?: A Little Help needed climbing 3-5 steps with a railing? : A Little 6 Click Score: 14    End of Session Equipment Utilized During Treatment: Gait belt Activity Tolerance: Patient tolerated treatment well Patient left: in chair;with  call bell/phone within reach;with chair alarm set Nurse Communication: Mobility status PT Visit Diagnosis: Other abnormalities of gait and mobility (R26.89);Muscle weakness (generalized) (M62.81)     Time: 5462-7035 PT Time Calculation (min) (ACUTE ONLY): 27 min  Charges:  $Gait Training: 8-22 mins $Therapeutic Activity: 8-22 mins                     Samuella Bruin, [place]  Acute Rehab [phone]    Samuella Bruin 02/04/2018, 2:47 PM

## 2018-02-04 NOTE — H&P (View-Only) (Signed)
Progress Note  Patient Name: John Stark Date of Encounter: 02/04/2018  Primary Cardiologist: Lauree Chandler, MD   Subjective   Feeling much better.  No shortness of breath.  Unable to lie flat 2/2 back pain.  Legs are much better after diuresis. He denies any chest pain and was surprised by LAD disease.   Inpatient Medications    Scheduled Meds: . aspirin EC  81 mg Oral Daily  . colesevelam  1,250 mg Oral BID WC  . doxazosin  4 mg Oral QHS  . famotidine  20 mg Oral Daily  . finasteride  5 mg Oral QHS  . ipratropium  0.5 mg Nebulization BID  . levalbuterol  0.63 mg Nebulization BID  . metoprolol succinate  50 mg Oral Daily  . mometasone-formoterol  2 puff Inhalation BID  . predniSONE  30 mg Oral Q breakfast  . sodium chloride flush  3 mL Intravenous Q12H  . torsemide  40 mg Oral Daily   Continuous Infusions: . sodium chloride    . heparin 1,300 Units/hr (02/04/18 0400)   PRN Meds: sodium chloride, acetaminophen **OR** acetaminophen, ondansetron **OR** ondansetron (ZOFRAN) IV, polyethylene glycol, sodium chloride flush   Vital Signs    Vitals:   02/03/18 2017 02/03/18 2300 02/04/18 0411 02/04/18 0733  BP:   123/83   Pulse:   75   Resp:   18   Temp: 98 F (36.7 C)  97.7 F (36.5 C)   TempSrc: Oral  Oral   SpO2:  96% 99% 96%  Weight:   130.4 kg   Height:        Intake/Output Summary (Last 24 hours) at 02/04/2018 0911 Last data filed at 02/04/2018 0838 Gross per 24 hour  Intake 1550.8 ml  Output 5075 ml  Net -3524.2 ml   Filed Weights   02/02/18 0453 02/03/18 0500 02/04/18 0411  Weight: 132.5 kg 134.5 kg 130.4 kg    Telemetry    Atrial fibrillation.  Rate 90s-110s.  - Personally Reviewed  ECG    n/a - Personally Reviewed  Physical Exam   VS:  BP 123/83 (BP Location: Right Arm)   Pulse 75   Temp 97.7 F (36.5 C) (Oral)   Resp 18   Ht 5\' 11"  (1.803 m)   Wt 130.4 kg Comment: a  SpO2 96%   BMI 40.10 kg/m  , BMI Body mass index is  40.1 kg/m. GENERAL: Chronically ill appearing morbidly obese man sitting on side of bed in no acute distress HEENT: Pupils equal round and reactive, fundi not visualized, oral mucosa unremarkable NECK:  No jugular venous distention, waveform within normal limits, carotid upstroke brisk and symmetric, no bruits, no thyromegaly LYMPHATICS:  No cervical adenopathy LUNGS:  Clear to auscultation bilaterally HEART:  Tachycardic.  Irregularly irregular.  PMI not displaced or sustained,S1 and S2 within normal limits, no S3, no S4, no clicks, no rubs, no murmurs ABD:  Flat, positive bowel sounds normal in frequency in pitch, no bruits, no rebound, no guarding, no midline pulsatile mass, no hepatomegaly, no splenomegaly EXT:  2 plus pulses throughout, trace edema, no cyanosis no clubbing SKIN:  No rashes no nodules NEURO:  Cranial nerves II through XII grossly intact, motor grossly intact throughout [facility]:  Cognitively intact, oriented to person place and time   Labs    Chemistry Recent Labs  Lab 02/02/18 0613 02/03/18 0535 02/04/18 0339  NA 141 140 138  K 4.6 4.3 4.4  CL 107 106 99  CO2  26 26 30   GLUCOSE 90 115* 101*  BUN 33* 34* 37*  CREATININE 1.44* 1.39* 1.52*  CALCIUM 8.4* 8.2* 8.9  GFRNONAA 44* 46* 42*  GFRAA 51* 54* 48*  ANIONGAP 8 8 9      Hematology Recent Labs  Lab 02/01/18 0539 02/02/18 0613 02/04/18 0339  WBC 11.4* 9.5 12.4*  RBC 3.75* 3.76* 4.14*  HGB 11.4* 11.4* 12.4*  HCT 35.7* 36.3* 38.8*  MCV 95.2 96.5 93.7  MCH 30.4 30.3 30.0  MCHC 31.9 31.4 32.0  RDW 16.1* 16.0* 15.6*  PLT 168 164 160    Cardiac Enzymes Recent Labs  Lab 01/28/18 2039 01/29/18 0419 01/29/18 0936  TROPONINI 0.10* 0.09* 0.09*    Recent Labs  Lab 01/28/18 1614  TROPIPOC 0.07     BNP Recent Labs  Lab 01/28/18 2039  BNP 1,523.4*     DDimer No results for input(s): DDIMER in the last 168 hours.   Radiology    No results found.  Cardiac Studies   RHC/LHC  02/01/18: Conclusions: 1. Severe 2-vessel CAD with 80-90% ostial/proximal LAD stenosis and chronic total occlusion of the mid RCA. 2. Mildly elevated left heart filling pressure. 3. Moderately elevated right heart filling pressure. 4. Severe pulmonary hypertension. 5. Low normal Fick CO/CI.  Recommendations: 1. Equalization of end-diastolic pressures with severe pulmonary hypertension suggestive of restrictive cardiomyopathy. Consider advanced HF consultation for optimization of volume status. 2. Aggressive secondary prevention and medical therapy. Once he is optimized from a HF standpoint, high risk PCI to the ostial/proximal LAD will need to be considered. 3. Close follow-up of renal function.  Recommend to resume rivaroxaban, at currently prescribed dose and frequency, after decision regarding PCI to LAD.  If PCI is planned, recommend concurrent antiplatelet therapy of clopidogrel 75mg  daily for 12 months.  Restart heparin infusion 2 hours after TR band removal today.  Right Heart Pressures RA (mean): 18 mmHg RV (S/EDP): 75/20 mmHg PA (S/D, mean): 75/40 (52) mmHg PCWP (mean): Unable to obtain.  Ao sat: 96% PA sat: 59%  Fick CO: 5.8 L/min Fick CI: 2.3 L/min/m^2   Echo 01/29/18:  Study Conclusions  - Left ventricle: The cavity size was normal. Systolic function was   mildly to moderately reduced. The estimated ejection fraction was   in the range of 40% to 45%. Diffuse hypokinesis. There is   akinesis of the inferior myocardium. - Ventricular septum: The contour showed diastolic flattening. - Aortic valve: Valve mobility was restricted. There was trivial   regurgitation. - Aorta: Ascending aortic diameter: 41 mm (S). - Ascending aorta: The ascending aorta was mildly dilated. - Mitral valve: Calcified annulus. There was mild regurgitation. - Right ventricle: The cavity size was severely dilated. Wall   thickness was normal. Systolic function was moderately reduced. - Right  atrium: The atrium was severely dilated. - Pulmonary arteries: Systolic pressure was moderately increased.   PA peak pressure: 60 mm Hg (S).  Patient Profile     82 y.o. male with morbid obesity, permanent atrial fibrillation, hypertension, hyperlipidemia, COPD, GERD, bladder cancer s/p chemo/resection, CKD III here with acute on chronic systolic and diastolic heart failure.   Assessment & Plan    # Acute on chronic systolic and diastolic heart failure: # PAH with cor pulmonale: # Possible restrictive cardiomyopathy: Mostly right sided heart failure symptoms though LVEF reduced to 40-45%.  He has inferior akinesis and overall hypokinesis.  PASP was 60 mmHg on echo 01/29/18.  He was found to have equalization of end-diastolic pressures on  left and right heart catheterization and severe pulmonary hypertension concerning for restrictive cardiomyopathy.  His right-sided heart failure is thought to be contributed to by obesity hypoventilation/OSA.  He will need an outpatient sleep study.  He is not a candidate for cardiac MRI due to his size.  He will likely be referred for PYP scan as an outpatient.  # Longstanding persistent atrial fibrillation: He remains in atrial fibrillation.  His home Xarelto is on hold for likely left heart catheterization and PCI.  # CAD: # Hyperlipidemia: CTO mid RCA with inferior akinesis on echo.  80-90% ostial/proximal LAD.  We will ask cath team to review and consider high risk PCI 8/13 if renal function improves.  He has been intolerant of statins and will be considered for PCSK9 inhibitor as an outpatient.   # Hypertension: Blood pressures well-controlled.  Continue doxazosin and metoprolol.  # Acute on chronic kidney disease: Renal function is slightly worse today.  He appears euvolemic on exam reports that his edema is much better than baseline.  He received 1 more dose of IV Lasix yesterday.  We will hold his torsemide today with plans for left heart  catheterization tomorrow if renal function is stable.      For questions or updates, please contact [place] Please consult [email or web address] for contact info under Cardiology/STEMI.      Signed, Skeet Latch, MD  02/04/2018, 9:11 AM

## 2018-02-04 NOTE — Progress Notes (Signed)
PROGRESS NOTE    MANCEL LARDIZABAL  ZOX:[id number] DOB: 01-22-1936 DOA: 01/28/2018 PCP: Alroy Dust, L.Marlou Sa, MD    Brief Narrative: John Stark is a 82 y.o. male with medical history significant for CHF, HTN,  HLD, GERD, COPD, stage III chronic kidney disease permanent atrial fibrillation status post prior DCCV with recurrence, presented to the ED with complaints of shortness of breath of 2 weeks duration, 10 pound weight gain and worsening bilateral lower extremity swelling in the context of missing Lasix doses and eating salty food.  Admitted for decompensated CHF.  New cardiomyopathy noted.  Cardiology consulted.  Hospital course complicated by acute on chronic kidney injury.  Lasix discontinued.  S/P right and left heart cath and coronary angiography 8/9.  Cardiology adjusting medications.  Slowly improving.  Assessment & Plan:   Principal Problem:   Acute exacerbation of CHF (congestive heart failure) (HCC) Active Problems:   Overweight   Essential hypertension   DIASTOLIC HEART FAILURE, CHRONIC   Atrial fibrillation (HCC)   COPD (chronic obstructive pulmonary disease) (HCC)   Acute on chronic combined systolic and diastolic CHF (congestive heart failure) (HCC)   Elevated troponin   Coronary artery disease involving native coronary artery of native heart without angina pectoris  Acute on chronic combined systolic and diastolic CHF Likely precipitated by poor compliance to diuretics and low-salt diet and new cardiomyopathy.  He was diuresed with Lasix IV with improvement.  -6.7 L since admission.  Weight down by 7 kgs since admission.  Lasix discontinued 8/8 due to worsening renal insufficiency.  Xarelto held and heparin per pharmacy started 8/7 precath.  Aspirin 81 mg daily also started.  Underwent right and left heart cath and coronary angiography 02/01/2018 with findings as below that significantly showed two-vessel CAD with 80-90% ostial/proximal LAD stenosis and CTO of the mid RCA  and severe pulmonary hypertension.   Advanced HF MD consultation and follow-up appreciated.  They suspect main issue is PAH and RV failure due to COPD and OSA/OHS and may have a component of restriction.   As of IV Lasix 80 mg on 8/11, volume status improved, creatinine increased, torsemide held for today.  Cardiomyopathy/CAD As per cardiology follow-up, has CTO mid RCA and 80-90% stenosis in ostial/proximal LAD, creatinine has stabilized and they favor PCI possibly for 8/13.  He will need to be loaded with Plavix prior to PCI and will eventually need to restart Xarelto.  Cannot tolerate statins and outpatient referral to lipid clinic for [place]. As per cardiology, not candidate for cardiac MRI due to size, will likely be referred for PYP scan as outpatient and will need outpatient sleep study to evaluate for OSA/OHS.  Pulmonary hypertension with cor pulmonale Severe pulmonary hypertension by cath 8/9.  Management as above.  Permanent atrial fibrillation Xarelto changed to IV heparin pre cardiac cath.  Continue beta-blockers (metoprolol changed to Toprol-XL 50 mg daily).  Albuterol switched to Xopenex.  TSH normal.  Controlled ventricular rate.  Xarelto continues to be held for possible PCI 8/13.  He is now on aspirin 81 mg and cardiology will load with Plavix prior to PCI  NSVT 6 beat NSVT noted on telemetry on 01/31/2018 at 2:32 PM.  Ischemic evaluation as above.  Continue beta-blockers.  No further episodes noted.  Wheezing/COPD exacerbation versus cardiac asthma versus VCD Patient had mostly upper airway wheezing but not stridor on 8/7.  Otherwise no clinical bronchospasm.  Change IV Solu-Medrol to oral prednisone taper.  Added PPI to Pepcid.  Improving.  Bronchospasm  resolved.  Weaning prednisone.  Acute respiratory failure with hypoxia Likely multifactorial related to COPD exacerbation, decompensated CHF, severe pulmonary hypertension and cor pulmonale, OSA and OHS.  May need home  oxygen.  Acute on stage III chronic kidney disease: ACEI was discontinued 8/7 given bump in his creatinine.  Baseline creatinine may be in the 1.2 range.  Creatinine has worsened from 1.48-1.83, likely due to diuresis.  IV Lasix discontinued 8/8.  Creatinine continues to gradually improve, down to 1.39 on 8/11 but has gone up to 1.5 after a dose of IV Lasix.  Continue to trend daily BMP.  Essential hypertension Continue metoprolol & doxazosin.  ACEI discontinued.  Mildly uncontrolled and fluctuating.  Morbid obesity/Body mass index is 40.1 kg/m.  Visual changes: As per cardiology, had these 6 weeks ago and suspected amaurosis fugax but stroke felt less likely due to being on Xarelto.  CT head 8/7 showed no acute findings but some concern for NPH which can be evaluated as outpatient.    Anemia Follow CBCs periodically.  Stable.  Hyperlipidemia:  Intolerant of statins.  Consider PCSK9 inhibitor as outpatient.   DVT prophylaxis: Xarelto discontinued and IV heparin started 8/7 Code Status: Full code.  Family Communication: None at bedside today. Disposition Plan: DC home pending clinical improvement and cardiology evaluation, may take several days.   Consultants:   Cardiology   Procedures:   ECHO  PROCEDURE:  Procedure(s): RIGHT/LEFT HEART CATH AND CORONARY ANGIOGRAPHY (N/A)  SURGEON:  Surgeon(s) and Role:    * End, Christopher, MD - Primary  FINDINGS: 1. 2-vessel CAD with 80-90% ostial/proximal LAD stenosis and CTO of the mid RCA. 2. Mildly elevated left heart filling pressure. 3. Moderately elevated right heart filling pressure. 4. Severe pulmonary hypertension. 5. Normal Fick CO/CI.  RECOMMENDATIONS: 1. Equalization of end-diastolic pressures with severe pulmonary hypertension suggestive of restrictive cardiomyopathy.  Consider advanced HF consultation for optimization of volume status. 2. Aggressive secondary prevention and medical therapy.  Once he is optimized  from a HF standpoint, high risk PCI to the ostial/proximal LAD will need to be considered. 3. Close f/u of renal function.  Antimicrobials:  Doxycycline discontinued after single dose on 8/5  Subjective: Denies complaints. States that he is having stents tomorrow. Leg swellings much improved Denies SOB or CP.  Objective: Vitals:   02/04/18 0411 02/04/18 0733 02/04/18 1003 02/04/18 1147  BP: 123/83  118/78 (!) 123/93  Pulse: 75   88  Resp: 18   19  Temp: 97.7 F (36.5 C)   97.7 F (36.5 C)  TempSrc: Oral   Oral  SpO2: 99% 96% 95% 96%  Weight: 130.4 kg     Height:        Intake/Output Summary (Last 24 hours) at 02/04/2018 1231 Last data filed at 02/04/2018 1148 Gross per 24 hour  Intake 1550.8 ml  Output 5475 ml  Net -3924.2 ml   Filed Weights   02/02/18 0453 02/03/18 0500 02/04/18 0411  Weight: 132.5 kg 134.5 kg 130.4 kg    Examination:  General exam: Pleasant elderly male, moderately built and morbidly obese lying comfortably supine in bed in left lateral position. Respiratory system: Clear to auscultation without wheezing, rhonchi or crackles.  No increased work of breathing. Cardiovascular system: S1 and S2 heard, irregularly irregular.  No JVD.  No ankle edema.  Telemetry personally reviewed: A. fib with controlled ventricular rate.  Stable Gastrointestinal system: Abdomen is nondistended, soft and nontender. No organomegaly or masses felt. Normal bowel sounds heard.  Stable  Central nervous system: Alert and oriented. No focal neurological deficits.  Stable Extremities: Symmetric 5 x 5 power. Skin: No rashes, lesions or ulcers   Data Reviewed: I have personally reviewed following labs and imaging studies  CBC: Recent Labs  Lab 01/28/18 1611 01/31/18 0303 02/01/18 0539 02/02/18 0613 02/04/18 0339  WBC 7.4 15.9* 11.4* 9.5 12.4*  NEUTROABS  --   --   --   --  10.1*  HGB 11.4* 11.9* 11.4* 11.4* 12.4*  HCT 36.9* 37.6* 35.7* 36.3* 38.8*  MCV 98.1 94.7 95.2 96.5  93.7  PLT 158 181 168 164 333   Basic Metabolic Panel: Recent Labs  Lab 01/31/18 1952 02/01/18 0539 02/02/18 0613 02/03/18 0535 02/04/18 0339  NA 136 140 141 140 138  K 4.7 4.5 4.6 4.3 4.4  CL 100 104 107 106 99  CO2 28 27 26 26 30   GLUCOSE 137* 104* 90 115* 101*  BUN 39* 36* 33* 34* 37*  CREATININE 1.75* 1.55* 1.44* 1.39* 1.52*  CALCIUM 8.3* 8.5* 8.4* 8.2* 8.9   GFR: Estimated Creatinine Clearance: 53.3 mL/min (A) (by C-G formula based on SCr of 1.52 mg/dL (H)).  Cardiac Enzymes: Recent Labs  Lab 01/28/18 2039 01/29/18 0419 01/29/18 0936  TROPONINI 0.10* 0.09* 0.09*     Radiology Studies: No results found.      Scheduled Meds: . aspirin EC  81 mg Oral Daily  . colesevelam  1,250 mg Oral BID WC  . doxazosin  4 mg Oral QHS  . famotidine  20 mg Oral Daily  . finasteride  5 mg Oral QHS  . ipratropium  0.5 mg Nebulization BID  . levalbuterol  0.63 mg Nebulization BID  . metoprolol succinate  50 mg Oral Daily  . mometasone-formoterol  2 puff Inhalation BID  . predniSONE  30 mg Oral Q breakfast  . sodium chloride flush  3 mL Intravenous Q12H  . [START ON 02/05/2018] torsemide  40 mg Oral Daily   Continuous Infusions: . sodium chloride    . heparin 1,300 Units/hr (02/04/18 0400)     LOS: 7 days    Time spent: 25 minutes.    Vernell Leep, MD, FACP, [facility]. [facility] Pager [phone]  If 7PM-7AM, please contact night-coverage [email or web address] Password [facility] 02/04/2018, 12:31 PM

## 2018-02-04 NOTE — Progress Notes (Signed)
Progress Note  Patient Name: John Stark Date of Encounter: 02/04/2018  Primary Cardiologist: Lauree Chandler, MD   Subjective   Feeling much better.  No shortness of breath.  Unable to lie flat 2/2 back pain.  Legs are much better after diuresis. He denies any chest pain and was surprised by LAD disease.   Inpatient Medications    Scheduled Meds: . aspirin EC  81 mg Oral Daily  . colesevelam  1,250 mg Oral BID WC  . doxazosin  4 mg Oral QHS  . famotidine  20 mg Oral Daily  . finasteride  5 mg Oral QHS  . ipratropium  0.5 mg Nebulization BID  . levalbuterol  0.63 mg Nebulization BID  . metoprolol succinate  50 mg Oral Daily  . mometasone-formoterol  2 puff Inhalation BID  . predniSONE  30 mg Oral Q breakfast  . sodium chloride flush  3 mL Intravenous Q12H  . torsemide  40 mg Oral Daily   Continuous Infusions: . sodium chloride    . heparin 1,300 Units/hr (02/04/18 0400)   PRN Meds: sodium chloride, acetaminophen **OR** acetaminophen, ondansetron **OR** ondansetron (ZOFRAN) IV, polyethylene glycol, sodium chloride flush   Vital Signs    Vitals:   02/03/18 2017 02/03/18 2300 02/04/18 0411 02/04/18 0733  BP:   123/83   Pulse:   75   Resp:   18   Temp: 98 F (36.7 C)  97.7 F (36.5 C)   TempSrc: Oral  Oral   SpO2:  96% 99% 96%  Weight:   130.4 kg   Height:        Intake/Output Summary (Last 24 hours) at 02/04/2018 0911 Last data filed at 02/04/2018 0838 Gross per 24 hour  Intake 1550.8 ml  Output 5075 ml  Net -3524.2 ml   Filed Weights   02/02/18 0453 02/03/18 0500 02/04/18 0411  Weight: 132.5 kg 134.5 kg 130.4 kg    Telemetry    Atrial fibrillation.  Rate 90s-110s.  - Personally Reviewed  ECG    n/a - Personally Reviewed  Physical Exam   VS:  BP 123/83 (BP Location: Right Arm)   Pulse 75   Temp 97.7 F (36.5 C) (Oral)   Resp 18   Ht 5\' 11"  (1.803 m)   Wt 130.4 kg Comment: a  SpO2 96%   BMI 40.10 kg/m  , BMI Body mass index is  40.1 kg/m. GENERAL: Chronically ill appearing morbidly obese man sitting on side of bed in no acute distress HEENT: Pupils equal round and reactive, fundi not visualized, oral mucosa unremarkable NECK:  No jugular venous distention, waveform within normal limits, carotid upstroke brisk and symmetric, no bruits, no thyromegaly LYMPHATICS:  No cervical adenopathy LUNGS:  Clear to auscultation bilaterally HEART:  Tachycardic.  Irregularly irregular.  PMI not displaced or sustained,S1 and S2 within normal limits, no S3, no S4, no clicks, no rubs, no murmurs ABD:  Flat, positive bowel sounds normal in frequency in pitch, no bruits, no rebound, no guarding, no midline pulsatile mass, no hepatomegaly, no splenomegaly EXT:  2 plus pulses throughout, trace edema, no cyanosis no clubbing SKIN:  No rashes no nodules NEURO:  Cranial nerves II through XII grossly intact, motor grossly intact throughout [facility]:  Cognitively intact, oriented to person place and time   Labs    Chemistry Recent Labs  Lab 02/02/18 0613 02/03/18 0535 02/04/18 0339  NA 141 140 138  K 4.6 4.3 4.4  CL 107 106 99  CO2  26 26 30   GLUCOSE 90 115* 101*  BUN 33* 34* 37*  CREATININE 1.44* 1.39* 1.52*  CALCIUM 8.4* 8.2* 8.9  GFRNONAA 44* 46* 42*  GFRAA 51* 54* 48*  ANIONGAP 8 8 9      Hematology Recent Labs  Lab 02/01/18 0539 02/02/18 0613 02/04/18 0339  WBC 11.4* 9.5 12.4*  RBC 3.75* 3.76* 4.14*  HGB 11.4* 11.4* 12.4*  HCT 35.7* 36.3* 38.8*  MCV 95.2 96.5 93.7  MCH 30.4 30.3 30.0  MCHC 31.9 31.4 32.0  RDW 16.1* 16.0* 15.6*  PLT 168 164 160    Cardiac Enzymes Recent Labs  Lab 01/28/18 2039 01/29/18 0419 01/29/18 0936  TROPONINI 0.10* 0.09* 0.09*    Recent Labs  Lab 01/28/18 1614  TROPIPOC 0.07     BNP Recent Labs  Lab 01/28/18 2039  BNP 1,523.4*     DDimer No results for input(s): DDIMER in the last 168 hours.   Radiology    No results found.  Cardiac Studies   RHC/LHC  02/01/18: Conclusions: 1. Severe 2-vessel CAD with 80-90% ostial/proximal LAD stenosis and chronic total occlusion of the mid RCA. 2. Mildly elevated left heart filling pressure. 3. Moderately elevated right heart filling pressure. 4. Severe pulmonary hypertension. 5. Low normal Fick CO/CI.  Recommendations: 1. Equalization of end-diastolic pressures with severe pulmonary hypertension suggestive of restrictive cardiomyopathy. Consider advanced HF consultation for optimization of volume status. 2. Aggressive secondary prevention and medical therapy. Once he is optimized from a HF standpoint, high risk PCI to the ostial/proximal LAD will need to be considered. 3. Close follow-up of renal function.  Recommend to resume rivaroxaban, at currently prescribed dose and frequency, after decision regarding PCI to LAD.  If PCI is planned, recommend concurrent antiplatelet therapy of clopidogrel 75mg  daily for 12 months.  Restart heparin infusion 2 hours after TR band removal today.  Right Heart Pressures RA (mean): 18 mmHg RV (S/EDP): 75/20 mmHg PA (S/D, mean): 75/40 (52) mmHg PCWP (mean): Unable to obtain.  Ao sat: 96% PA sat: 59%  Fick CO: 5.8 L/min Fick CI: 2.3 L/min/m^2   Echo 01/29/18:  Study Conclusions  - Left ventricle: The cavity size was normal. Systolic function was   mildly to moderately reduced. The estimated ejection fraction was   in the range of 40% to 45%. Diffuse hypokinesis. There is   akinesis of the inferior myocardium. - Ventricular septum: The contour showed diastolic flattening. - Aortic valve: Valve mobility was restricted. There was trivial   regurgitation. - Aorta: Ascending aortic diameter: 41 mm (S). - Ascending aorta: The ascending aorta was mildly dilated. - Mitral valve: Calcified annulus. There was mild regurgitation. - Right ventricle: The cavity size was severely dilated. Wall   thickness was normal. Systolic function was moderately reduced. - Right  atrium: The atrium was severely dilated. - Pulmonary arteries: Systolic pressure was moderately increased.   PA peak pressure: 60 mm Hg (S).  Patient Profile     82 y.o. male with morbid obesity, permanent atrial fibrillation, hypertension, hyperlipidemia, COPD, GERD, bladder cancer s/p chemo/resection, CKD III here with acute on chronic systolic and diastolic heart failure.   Assessment & Plan    # Acute on chronic systolic and diastolic heart failure: # PAH with cor pulmonale: # Possible restrictive cardiomyopathy: Mostly right sided heart failure symptoms though LVEF reduced to 40-45%.  He has inferior akinesis and overall hypokinesis.  PASP was 60 mmHg on echo 01/29/18.  He was found to have equalization of end-diastolic pressures on  left and right heart catheterization and severe pulmonary hypertension concerning for restrictive cardiomyopathy.  His right-sided heart failure is thought to be contributed to by obesity hypoventilation/OSA.  He will need an outpatient sleep study.  He is not a candidate for cardiac MRI due to his size.  He will likely be referred for PYP scan as an outpatient.  # Longstanding persistent atrial fibrillation: He remains in atrial fibrillation.  His home Xarelto is on hold for likely left heart catheterization and PCI.  # CAD: # Hyperlipidemia: CTO mid RCA with inferior akinesis on echo.  80-90% ostial/proximal LAD.  We will ask cath team to review and consider high risk PCI 8/13 if renal function improves.  He has been intolerant of statins and will be considered for PCSK9 inhibitor as an outpatient.   # Hypertension: Blood pressures well-controlled.  Continue doxazosin and metoprolol.  # Acute on chronic kidney disease: Renal function is slightly worse today.  He appears euvolemic on exam reports that his edema is much better than baseline.  He received 1 more dose of IV Lasix yesterday.  We will hold his torsemide today with plans for left heart  catheterization tomorrow if renal function is stable.      For questions or updates, please contact [place] Please consult [email or web address] for contact info under Cardiology/STEMI.      Signed, Skeet Latch, MD  02/04/2018, 9:11 AM

## 2018-02-04 NOTE — Progress Notes (Signed)
   The patient is schedule for cardiac cath tomorrow at 15:00. Orders are in. I have explained the procedure to the patient and his daughter. Questions answered.  The patient understands that risks included but are not limited to stroke (1 in 1000), death (1 in 84), kidney failure [usually temporary] (1 in 500), bleeding (1 in 200), allergic reaction [possibly serious] (1 in 200). He is willing to proceed.   Daune Perch, AGNP-C [facility] 02/04/2018  4:40 PM Pager: [phone]

## 2018-02-04 NOTE — Care Management Note (Signed)
Case Management Note  Patient Details  Name: John Stark MRN: [id number] Date of Birth: 1935/07/19  Subjective/Objective:     CHF              Action/Plan: Patient lives at home with spouse, continues to drive; PCP: Alroy Dust, L.Marlou Sa, MD; has private insurance with [facility] with prescription drug [place]; pharmacy of choice [facility]; DME - walker and cane at home; Patient is refusing all [place] services at this time; CM will continue to follow for progression of care.  Expected Discharge Date:     Possibly 02/08/2018             Expected Discharge Plan:  [place]  Discharge planning Services  CM Consult  Choice offered to:  Patient  HH Arranged:  Patient Refused HH  Status of Service:  In process, will continue to follow  Sherrilyn Rist 02/04/2018, 10:28 AM

## 2018-02-05 ENCOUNTER — Encounter (HOSPITAL_COMMUNITY): Payer: Self-pay | Admitting: Interventional Cardiology

## 2018-02-05 ENCOUNTER — Inpatient Hospital Stay (HOSPITAL_COMMUNITY)
Admission: EM | Disposition: A | Discharge status: Skilled Nursing Facility | Payer: Self-pay | Source: Home / Self Care | Attending: Internal Medicine

## 2018-02-05 ENCOUNTER — Encounter (HOSPITAL_COMMUNITY)
Admission: EM | Disposition: A | Discharge status: Skilled Nursing Facility | Payer: Self-pay | Source: Home / Self Care | Attending: Internal Medicine

## 2018-02-05 ENCOUNTER — Other Ambulatory Visit (HOSPITAL_COMMUNITY): Payer: Self-pay | Admitting: Pharmacist

## 2018-02-05 DIAGNOSIS — I481 Persistent atrial fibrillation: Secondary | ICD-10-CM

## 2018-02-05 HISTORY — PX: CORONARY STENT INTERVENTION: CATH118234

## 2018-02-05 LAB — CBC WITH DIFFERENTIAL/PLATELET
ABS IMMATURE GRANULOCYTES: 0.1 10*3/uL (ref 0.0–0.1)
Basophils Absolute: 0 10*3/uL (ref 0.0–0.1)
Basophils Relative: 0 %
EOS PCT: 1 %
Eosinophils Absolute: 0.1 10*3/uL (ref 0.0–0.7)
HCT: 37.1 % — ABNORMAL LOW (ref 39.0–52.0)
HEMOGLOBIN: 11.8 g/dL — AB (ref 13.0–17.0)
Immature Granulocytes: 1 %
LYMPHS PCT: 9 %
Lymphs Abs: 1.1 10*3/uL (ref 0.7–4.0)
MCH: 29.9 pg (ref 26.0–34.0)
MCHC: 31.8 g/dL (ref 30.0–36.0)
MCV: 94.2 fL (ref 78.0–100.0)
MONO ABS: 1.2 10*3/uL — AB (ref 0.1–1.0)
MONOS PCT: 10 %
NEUTROS ABS: 10.1 10*3/uL — AB (ref 1.7–7.7)
Neutrophils Relative %: 79 %
Platelets: 151 10*3/uL (ref 150–400)
RBC: 3.94 MIL/uL — ABNORMAL LOW (ref 4.22–5.81)
RDW: 15.6 % — ABNORMAL HIGH (ref 11.5–15.5)
WBC: 12.6 10*3/uL — ABNORMAL HIGH (ref 4.0–10.5)

## 2018-02-05 LAB — BASIC METABOLIC PANEL
Anion gap: 9 (ref 5–15)
BUN: 31 mg/dL — AB (ref 8–23)
CHLORIDE: 101 mmol/L (ref 98–111)
CO2: 29 mmol/L (ref 22–32)
Calcium: 8.6 mg/dL — ABNORMAL LOW (ref 8.9–10.3)
Creatinine, Ser: 1.38 mg/dL — ABNORMAL HIGH (ref 0.61–1.24)
GFR calc Af Amer: 54 mL/min — ABNORMAL LOW (ref >60)
GFR calc non Af Amer: 47 mL/min — ABNORMAL LOW (ref >60)
GLUCOSE: 106 mg/dL — AB (ref 70–99)
POTASSIUM: 4.5 mmol/L (ref 3.5–5.1)
Sodium: 139 mmol/L (ref 135–145)

## 2018-02-05 LAB — APTT: aPTT: 75 seconds — ABNORMAL HIGH (ref 24–36)

## 2018-02-05 LAB — MULTIPLE MYELOMA PANEL, SERUM
ALBUMIN/GLOB SERPL: 1.1 (ref 0.7–1.7)
ALPHA 1: 0.2 g/dL (ref 0.0–0.4)
ALPHA2 GLOB SERPL ELPH-MCNC: 0.7 g/dL (ref 0.4–1.0)
Albumin SerPl Elph-Mcnc: 3 g/dL (ref 2.9–4.4)
B-GLOBULIN SERPL ELPH-MCNC: 0.9 g/dL (ref 0.7–1.3)
GLOBULIN, TOTAL: 3 g/dL (ref 2.2–3.9)
Gamma Glob SerPl Elph-Mcnc: 1.1 g/dL (ref 0.4–1.8)
IGG (IMMUNOGLOBIN G), SERUM: 1143 mg/dL (ref 700–1600)
IGM (IMMUNOGLOBULIN M), SRM: 26 mg/dL (ref 15–143)
IgA: 195 mg/dL (ref 61–437)
M PROTEIN SERPL ELPH-MCNC: 0.7 g/dL — AB
Total Protein ELP: 6 g/dL (ref 6.0–8.5)

## 2018-02-05 LAB — POCT ACTIVATED CLOTTING TIME
Activated Clotting Time: 219 seconds
Activated Clotting Time: 340 seconds

## 2018-02-05 LAB — HEPARIN LEVEL (UNFRACTIONATED): Heparin Unfractionated: 0.39 IU/mL (ref 0.30–0.70)

## 2018-02-05 SURGERY — CORONARY STENT INTERVENTION
Anesthesia: LOCAL

## 2018-02-05 MED ORDER — SODIUM CHLORIDE 0.9 % IV SOLN: 250.0000 mL | INTRAVENOUS | Status: DC | PRN

## 2018-02-05 MED ORDER — VERAPAMIL HCL 2.5 MG/ML IV SOLN
INTRAVENOUS | Status: AC
  Filled 2018-02-05: qty 2

## 2018-02-05 MED ORDER — ANGIOPLASTY BOOK
Freq: Once | Status: AC
  Administered 2018-02-05: 21:00:00 1
  Filled 2018-02-05: qty 1

## 2018-02-05 MED ORDER — HEPARIN SODIUM (PORCINE) 1000 UNIT/ML IJ SOLN
INTRAMUSCULAR | Status: AC
  Filled 2018-02-05: qty 1

## 2018-02-05 MED ORDER — ASPIRIN 81 MG PO CHEW
81.0000 mg | CHEWABLE_TABLET | Freq: Every day | ORAL | Status: DC
  Administered 2018-02-06 – 2018-02-08 (×3): 81 mg via ORAL
  Filled 2018-02-05 (×3): qty 1

## 2018-02-05 MED ORDER — SODIUM CHLORIDE 0.9% FLUSH
3.0000 mL | Freq: Two times a day (BID) | INTRAVENOUS | Status: DC
  Administered 2018-02-06: 3 mL via INTRAVENOUS

## 2018-02-05 MED ORDER — HEPARIN (PORCINE) IN NACL 1000-0.9 UT/500ML-% IV SOLN
INTRAVENOUS | Status: DC | PRN
  Administered 2018-02-05 (×2): 500 mL

## 2018-02-05 MED ORDER — NITROGLYCERIN 1 MG/10 ML FOR IR/CATH LAB
INTRA_ARTERIAL | Status: AC
  Filled 2018-02-05: qty 10

## 2018-02-05 MED ORDER — ONDANSETRON HCL 4 MG/2ML IJ SOLN: 4.0000 mg | Freq: Four times a day (QID) | INTRAMUSCULAR | Status: DC | PRN

## 2018-02-05 MED ORDER — HEPARIN SODIUM (PORCINE) 1000 UNIT/ML IJ SOLN
INTRAMUSCULAR | Status: DC | PRN
  Administered 2018-02-05: 5000 [IU] via INTRAVENOUS
  Administered 2018-02-05: 8000 [IU] via INTRAVENOUS

## 2018-02-05 MED ORDER — RIVAROXABAN 20 MG PO TABS
20.0000 mg | ORAL_TABLET | Freq: Every day | ORAL | Status: DC
  Administered 2018-02-06 – 2018-02-08 (×3): 20 mg via ORAL
  Filled 2018-02-05 (×3): qty 1

## 2018-02-05 MED ORDER — VERAPAMIL HCL 2.5 MG/ML IV SOLN
INTRAVENOUS | Status: DC | PRN
  Administered 2018-02-05: 10 mL via INTRA_ARTERIAL

## 2018-02-05 MED ORDER — FENTANYL CITRATE (PF) 100 MCG/2ML IJ SOLN
INTRAMUSCULAR | Status: AC
  Filled 2018-02-05: qty 2

## 2018-02-05 MED ORDER — HYDRALAZINE HCL 20 MG/ML IJ SOLN: 5.0000 mg | INTRAMUSCULAR | Status: AC | PRN

## 2018-02-05 MED ORDER — LIDOCAINE HCL (PF) 1 % IJ SOLN
INTRAMUSCULAR | Status: AC
  Filled 2018-02-05: qty 30

## 2018-02-05 MED ORDER — LABETALOL HCL 5 MG/ML IV SOLN: 10.0000 mg | INTRAVENOUS | Status: AC | PRN

## 2018-02-05 MED ORDER — MIDAZOLAM HCL 2 MG/2ML IJ SOLN
INTRAMUSCULAR | Status: DC | PRN
  Administered 2018-02-05: 1 mg via INTRAVENOUS

## 2018-02-05 MED ORDER — CLOPIDOGREL BISULFATE 75 MG PO TABS
600.0000 mg | ORAL_TABLET | Freq: Once | ORAL | Status: AC
  Administered 2018-02-05: 600 mg via ORAL
  Filled 2018-02-05: qty 8

## 2018-02-05 MED ORDER — FENTANYL CITRATE (PF) 100 MCG/2ML IJ SOLN
INTRAMUSCULAR | Status: DC | PRN
  Administered 2018-02-05: 25 ug via INTRAVENOUS

## 2018-02-05 MED ORDER — MIDAZOLAM HCL 2 MG/2ML IJ SOLN
INTRAMUSCULAR | Status: AC
  Filled 2018-02-05: qty 2

## 2018-02-05 MED ORDER — SODIUM CHLORIDE 0.9% FLUSH: 3.0000 mL | INTRAVENOUS | Status: DC | PRN

## 2018-02-05 MED ORDER — ACETAMINOPHEN 325 MG PO TABS: 650.0000 mg | ORAL_TABLET | ORAL | Status: DC | PRN

## 2018-02-05 MED ORDER — METOPROLOL SUCCINATE ER 50 MG PO TB24: 100.0000 mg | ORAL_TABLET | Freq: Every day | ORAL | Status: DC

## 2018-02-05 MED ORDER — METOPROLOL SUCCINATE ER 50 MG PO TB24
50.0000 mg | ORAL_TABLET | Freq: Once | ORAL | Status: AC
  Administered 2018-02-05: 50 mg via ORAL
  Filled 2018-02-05: qty 1

## 2018-02-05 MED ORDER — CLOPIDOGREL BISULFATE 75 MG PO TABS
75.0000 mg | ORAL_TABLET | Freq: Every day | ORAL | Status: DC
  Administered 2018-02-06 – 2018-02-08 (×3): 75 mg via ORAL
  Filled 2018-02-05 (×3): qty 1

## 2018-02-05 MED ORDER — HEPARIN (PORCINE) IN NACL 1000-0.9 UT/500ML-% IV SOLN
INTRAVENOUS | Status: AC
  Filled 2018-02-05: qty 1000

## 2018-02-05 MED ORDER — LIDOCAINE HCL (PF) 1 % IJ SOLN
INTRAMUSCULAR | Status: DC | PRN
  Administered 2018-02-05: 2 mL

## 2018-02-05 MED ORDER — IOHEXOL 350 MG/ML SOLN
INTRAVENOUS | Status: DC | PRN
  Administered 2018-02-05: 80 mL

## 2018-02-05 MED ORDER — SODIUM CHLORIDE 0.9 % IV SOLN
INTRAVENOUS | Status: AC
  Administered 2018-02-05: 14:00:00 via INTRAVENOUS

## 2018-02-05 SURGICAL SUPPLY — 17 items
BALLN SAPPHIRE 2.5X20 (BALLOONS) ×2
BALLN ~~LOC~~ EMERGE MR 3.75X12 (BALLOONS) ×2
BALLOON SAPPHIRE 2.5X20 (BALLOONS) ×1 IMPLANT
BALLOON ~~LOC~~ EMERGE MR 3.75X12 (BALLOONS) ×1 IMPLANT
CATH LAUNCHER 6FR EBU 3.75 (CATHETERS) ×2 IMPLANT
DEVICE RAD COMP TR BAND LRG (VASCULAR PRODUCTS) ×2 IMPLANT
GLIDESHEATH SLEND SS 6F .021 (SHEATH) ×2 IMPLANT
GUIDEWIRE INQWIRE 1.5J.035X260 (WIRE) ×1 IMPLANT
INQWIRE 1.5J .035X260CM (WIRE) ×2
KIT ENCORE 26 ADVANTAGE (KITS) ×2 IMPLANT
KIT HEART LEFT (KITS) ×2 IMPLANT
KIT HEMO VALVE WATCHDOG (MISCELLANEOUS) ×2 IMPLANT
PACK CARDIAC CATHETERIZATION (CUSTOM PROCEDURE TRAY) ×2 IMPLANT
STENT ORSIRO 3.0X26 (Permanent Stent) ×2 IMPLANT
TRANSDUCER W/STOPCOCK (MISCELLANEOUS) ×2 IMPLANT
TUBING CIL FLEX 10 FLL-RA (TUBING) ×2 IMPLANT
WIRE FIGHTER CROSSING 190CM (WIRE) ×2 IMPLANT

## 2018-02-05 NOTE — Progress Notes (Signed)
TR BAND REMOVAL  LOCATION:  right radial  DEFLATED PER PROTOCOL:  Yes.    TIME BAND OFF / DRESSING APPLIED:   1800   SITE UPON ARRIVAL:   Level 2  SITE AFTER BAND REMOVAL:  Level 2  CIRCULATION SENSATION AND MOVEMENT:  Within Normal Limits  Yes.    COMMENTS:  Arrived with level 2 hematoma proximal to site, approx 4 X 4 cm.  Closely followed, manual pressure held X 15 minutes on two occasions, coban applied for 2 hours.  Soft at this time, marked.

## 2018-02-05 NOTE — Progress Notes (Signed)
ANTICOAGULATION CONSULT NOTE - Initial Consult  Pharmacy Consult for Xarelto Indication: atrial fibrillation  Allergies  Allergen Reactions  . Crestor [Rosuvastatin Calcium] Other (See Comments)    Muscle aches   . Pravastatin Other (See Comments)    Muscle aches   . Simvastatin Other (See Comments)    Muscle aches     Patient Measurements: Height: 5\' 11"  (180.3 cm) Weight: 287 lb 1.6 oz (130.2 kg) IBW/kg (Calculated) : 75.3  Vital Signs: Temp: 97.7 F (36.5 C) (08/13 1352) Temp Source: Oral (08/13 1352) BP: 149/85 (08/13 1352) Pulse Rate: 100 (08/13 1352)  Labs: Recent Labs    02/03/18 0535 02/04/18 0339 02/05/18 0451  HGB  --  12.4* 11.8*  HCT  --  38.8* 37.1*  PLT  --  160 151  APTT 85* 79* 75*  HEPARINUNFRC 0.41 0.48 0.39  CREATININE 1.39* 1.52* 1.38*    Estimated Creatinine Clearance: 58.8 mL/min (A) (by C-G formula based on SCr of 1.38 mg/dL (H)).   Medical History: Past Medical History:  Diagnosis Date  . AAA (abdominal aortic aneurysm) ([place])    a. infrarenal abdominal aortic aneurysm by CT 11/2017 4.7 x 3.9 cm versus 4.4 x 4 1 cm  . Asthma    "as a child"  . Bladder cancer ([place])   . Carotid artery disease ([place])    a. carotid duplex 2019 with minimal plaque, no significant stenosis.  . CKD (chronic kidney disease), stage III ([place])   . COPD (chronic obstructive pulmonary disease) ([place])   . Edema    FEET/LEGS  . Enlarged prostate   . GERD (gastroesophageal reflux disease)   . Hearing loss in right ear   . Hiatal hernia    By CT  . HLD (hyperlipidemia)   . HTN (hypertension)   . Neuropathy   . Osteoarthrosis, unspecified whether generalized or localized, unspecified site   . Overweight(278.02)   . Permanent atrial fibrillation ([place])    a. Xarelto started 05/22/12.  . Right knee pain    awaiting knee replacement   Assessment:  Anticoag: Afib on Xarelto PTA. Stopped Xarelto (LD 8/6 1753), begin IV heparin for afib.Heparin level 0.39, aptt  75. Hgb 11.8 stable. Plts stable. Cath 8/13 with DES.  Goal of Therapy:  Therapeutic oral anticoagulation   Plan:  D/c IV heparin post-cath 8/14 Resume Xarelto 20mg  daily  John Stark S. Alford Highland, PharmD, BCPS Clinical Staff Pharmacist Pager [phone]  John Stark 02/05/2018,2:16 PM

## 2018-02-05 NOTE — Progress Notes (Signed)
ANTICOAGULATION CONSULT NOTE - Follow Up Consult  Pharmacy Consult for Heparin Indication: CAD  Allergies  Allergen Reactions  . Crestor [Rosuvastatin Calcium] Other (See Comments)    Muscle aches   . Pravastatin Other (See Comments)    Muscle aches   . Simvastatin Other (See Comments)    Muscle aches     Patient Measurements: Height: 5\' 11"  (180.3 cm) Weight: 287 lb 1.6 oz (130.2 kg) IBW/kg (Calculated) : 75.3 Heparin Dosing Weight: 108 kg  Vital Signs: Temp: 97.8 F (36.6 C) (08/13 0934) Temp Source: Oral (08/13 0934) BP: 147/100 (08/13 0934) Pulse Rate: 101 (08/13 0934)  Labs: Recent Labs    02/03/18 0535 02/04/18 0339 02/05/18 0451  HGB  --  12.4* 11.8*  HCT  --  38.8* 37.1*  PLT  --  160 151  APTT 85* 79* 75*  HEPARINUNFRC 0.41 0.48 0.39  CREATININE 1.39* 1.52* 1.38*    Estimated Creatinine Clearance: 58.8 mL/min (A) (by C-G formula based on SCr of 1.38 mg/dL (H)).  Assessment:  Anticoag: Afib on Xarelto PTA. Stopped Xarelto (LD 8/6 1753), begin IV heparin for afib. Needs more diuresis then cath.  Heparin level 0.39, aptt 75. Hgb 11.8 stable. Plts stable.     Goal of Therapy:   Heparin level 0.3-0.7 units/ml aPTT 66-102 seconds Monitor platelets by anticoagulation protocol: Yes   Plan:  -Continue heparin 1300 units / hr  -Monitor daily heparin level, CBC, and s/sx bleeding   Ivan Maskell S. Alford Highland, PharmD, [facility] Clinical Staff Pharmacist Pager [phone]  02/05/2018 9:51 AM

## 2018-02-05 NOTE — Interval H&P Note (Signed)
Cath Lab Visit (complete for each Cath Lab visit)  Clinical Evaluation Leading to the Procedure:   ACS: Yes.    Non-ACS:    Anginal Classification: CCS IV  Anti-ischemic medical therapy: Minimal Therapy (1 class of medications)  Non-Invasive Test Results: No non-invasive testing performed  Prior CABG: No previous CABG   High risk PCI.  D/w patient.  D/w Dr. Oval Linsey and Dr. Saunders Revel and Dr. Aundra Dubin.  It was felt he would not be a good candidate for CABG.  Plan for PCI.  Patient aware.  He is ok to lie flat.    History and Physical Interval Note:  02/05/2018 10:27 AM  John Stark  has presented today for surgery, with the diagnosis of cad  The various methods of treatment have been discussed with the patient and family. After consideration of risks, benefits and other options for treatment, the patient has consented to  Procedure(s): CORONARY STENT INTERVENTION (N/A) as a surgical intervention .  The patient's history has been reviewed, patient examined, no change in status, stable for surgery.  I have reviewed the patient's chart and labs.  Questions were answered to the patient's satisfaction.     Larae Grooms

## 2018-02-05 NOTE — Progress Notes (Signed)
Progress Note  Patient Name: John Stark Date of Encounter: 02/05/2018  Primary Cardiologist: Lauree Chandler, MD   Subjective   Feeling well.  Nervous about cath.  His breathing has improved and he has no shortness of breath.   Inpatient Medications    Scheduled Meds: . aspirin EC  81 mg Oral Daily  . clopidogrel  600 mg Oral Once  . colesevelam  1,250 mg Oral BID WC  . doxazosin  4 mg Oral QHS  . famotidine  20 mg Oral Daily  . finasteride  5 mg Oral QHS  . ipratropium  0.5 mg Nebulization BID  . levalbuterol  0.63 mg Nebulization BID  . metoprolol succinate  50 mg Oral Daily  . mometasone-formoterol  2 puff Inhalation BID  . predniSONE  20 mg Oral Q breakfast  . sodium chloride flush  3 mL Intravenous Q12H  . sodium chloride flush  3 mL Intravenous Q12H  . torsemide  40 mg Oral Daily   Continuous Infusions: . sodium chloride    . sodium chloride    . sodium chloride 10 mL/hr at 02/05/18 0514  . heparin 1,300 Units/hr (02/04/18 0400)   PRN Meds: sodium chloride, sodium chloride, acetaminophen **OR** acetaminophen, ondansetron **OR** ondansetron (ZOFRAN) IV, polyethylene glycol, sodium chloride flush, sodium chloride flush   Vital Signs    Vitals:   02/05/18 0522 02/05/18 0847 02/05/18 0849 02/05/18 0934  BP: (!) 146/92   (!) 147/100  Pulse: (!) 58   (!) 101  Resp: 18     Temp: (!) 97.5 F (36.4 C)   97.8 F (36.6 C)  TempSrc: Oral   Oral  SpO2: 99% 93% 93% 97%  Weight: 130.2 kg     Height:        Intake/Output Summary (Last 24 hours) at 02/05/2018 1031 Last data filed at 02/05/2018 0907 Gross per 24 hour  Intake 594 ml  Output 400 ml  Net 194 ml   Filed Weights   02/03/18 0500 02/04/18 0411 02/05/18 0522  Weight: 134.5 kg 130.4 kg 130.2 kg    Telemetry    Atrial fibrillation.  Rate 90s-110s.  - Personally Reviewed  ECG    n/a - Personally Reviewed  Physical Exam   VS:  BP (!) 147/100 (BP Location: Left Arm)   Pulse (!) 101    Temp 97.8 F (36.6 C) (Oral)   Resp 18   Ht 5\' 11"  (1.803 m)   Wt 130.2 kg   SpO2 97%   BMI 40.04 kg/m  , BMI Body mass index is 40.04 kg/m. GENERAL: Chronically ill appearing morbidly obese man sitting on side of bed in no acute distress HEENT: Pupils equal round and reactive, fundi not visualized, oral mucosa unremarkable NECK:  JVP 1 cm above clavicle sitting upright.  Waveform within normal limits, carotid upstroke brisk and symmetric, no bruits LUNGS:  Clear to auscultation bilaterally HEART:  Tachycardic.  Irregularly irregular.  PMI not displaced or sustained,S1 and S2 within normal limits, no S3, no S4, no clicks, no rubs, no murmurs ABD:  Flat, positive bowel sounds normal in frequency in pitch, no bruits, no rebound, no guarding, no midline pulsatile mass, no hepatomegaly, no splenomegaly EXT:  2 plus pulses throughout, 1+ LE edema bilaterally, no cyanosis no clubbing SKIN:  No rashes no nodules NEURO:  Cranial nerves II through XII grossly intact, motor grossly intact throughout PSYCH:  Cognitively intact, oriented to person place and time   [employer]  Recent Labs  Lab 02/03/18 0535 02/04/18 0339 02/05/18 0451  NA 140 138 139  K 4.3 4.4 4.5  CL 106 99 101  CO2 26 30 29   GLUCOSE 115* 101* 106*  BUN 34* 37* 31*  CREATININE 1.39* 1.52* 1.38*  CALCIUM 8.2* 8.9 8.6*  GFRNONAA 46* 42* 47*  GFRAA 54* 48* 54*  ANIONGAP 8 9 9      Hematology Recent Labs  Lab 02/02/18 0613 02/04/18 0339 02/05/18 0451  WBC 9.5 12.4* 12.6*  RBC 3.76* 4.14* 3.94*  HGB 11.4* 12.4* 11.8*  HCT 36.3* 38.8* 37.1*  MCV 96.5 93.7 94.2  MCH 30.3 30.0 29.9  MCHC 31.4 32.0 31.8  RDW 16.0* 15.6* 15.6*  PLT 164 160 151    Cardiac Enzymes No results for input(s): TROPONINI in the last 168 hours.  No results for input(s): TROPIPOC in the last 168 hours.   BNP No results for input(s): BNP, PROBNP in the last 168 hours.   DDimer No results for input(s): DDIMER in the last 168  hours.   Radiology    No results found.  Cardiac Studies   RHC/LHC 02/01/18: Conclusions: 1. Severe 2-vessel CAD with 80-90% ostial/proximal LAD stenosis and chronic total occlusion of the mid RCA. 2. Mildly elevated left heart filling pressure. 3. Moderately elevated right heart filling pressure. 4. Severe pulmonary hypertension. 5. Low normal Fick CO/CI.  Recommendations: 1. Equalization of end-diastolic pressures with severe pulmonary hypertension suggestive of restrictive cardiomyopathy. Consider advanced HF consultation for optimization of volume status. 2. Aggressive secondary prevention and medical therapy. Once he is optimized from a HF standpoint, high risk PCI to the ostial/proximal LAD will need to be considered. 3. Close follow-up of renal function.  Recommend to resume rivaroxaban, at currently prescribed dose and frequency, after decision regarding PCI to LAD.  If PCI is planned, recommend concurrent antiplatelet therapy of clopidogrel 75mg  daily for 12 months.  Restart heparin infusion 2 hours after TR band removal today.  Right Heart Pressures RA (mean): 18 mmHg RV (S/EDP): 75/20 mmHg PA (S/D, mean): 75/40 (52) mmHg PCWP (mean): Unable to obtain.  Ao sat: 96% PA sat: 59%  Fick CO: 5.8 L/min Fick CI: 2.3 L/min/m^2   Echo 01/29/18:  Study Conclusions  - Left ventricle: The cavity size was normal. Systolic function was   mildly to moderately reduced. The estimated ejection fraction was   in the range of 40% to 45%. Diffuse hypokinesis. There is   akinesis of the inferior myocardium. - Ventricular septum: The contour showed diastolic flattening. - Aortic valve: Valve mobility was restricted. There was trivial   regurgitation. - Aorta: Ascending aortic diameter: 41 mm (S). - Ascending aorta: The ascending aorta was mildly dilated. - Mitral valve: Calcified annulus. There was mild regurgitation. - Right ventricle: The cavity size was severely dilated. Wall    thickness was normal. Systolic function was moderately reduced. - Right atrium: The atrium was severely dilated. - Pulmonary arteries: Systolic pressure was moderately increased.   PA peak pressure: 60 mm Hg (S).  Patient Profile     82 y.o. male with morbid obesity, permanent atrial fibrillation, hypertension, hyperlipidemia, COPD, GERD, bladder cancer s/p chemo/resection, CKD III here with acute on chronic systolic and diastolic heart failure.   Assessment & Plan    # Acute on chronic systolic and diastolic heart failure: # PAH with cor pulmonale: # Possible restrictive cardiomyopathy: Mostly right sided heart failure symptoms though LVEF reduced to 40-45%.  He has inferior akinesis and overall hypokinesis.  PASP was 60 mmHg on echo 01/29/18.  He was found to have equalization of end-diastolic pressures on left and right heart catheterization and severe pulmonary hypertension concerning for restrictive cardiomyopathy.  His right-sided heart failure is thought to be contributed to by obesity hypoventilation/OSA.  He will need an outpatient sleep study.  He is not a candidate for cardiac MRI due to his size.  He will likely be referred for PYP scan as an outpatient.  Increase metoprolol to 100mg  daily.  Resume oral torsemide.  # Longstanding persistent atrial fibrillation: He remains in atrial fibrillation.  His home Xarelto is on hold for likely left heart catheterization and PCI.  Rate poorly controlled.  Increase metoprolol to 100mg  daily.   # CAD: # Hyperlipidemia: CTO mid RCA with inferior akinesis on echo.  80-90% ostial/proximal LAD.  His cath was reviewed with Dr. Irish Lack who also discussed with Dr. Saunders Revel.  He isn't a surgical candidate.  Will go for high risk PCI of the ostial LAD with Dr. Irish Lack today.  Renal function improving today after holding diuretics.  He has been intolerant of statins and will be considered for PCSK9 inhibitor as an outpatient.   # Hypertension: Blood  pressures above goal.  Likely 2/2 anxiety about his procedure.  Continue doxazosin and metoprolol. Will increase metoprolol to 100mg .  # Acute on chronic kidney disease: Renal function improved after holding lasix yesterday.  Resume oral torsemide.      For questions or updates, please contact [place] Please consult [email or web address] for contact info under Cardiology/STEMI.      Signed, Skeet Latch, MD  02/05/2018, 10:31 AM

## 2018-02-05 NOTE — Progress Notes (Signed)
PROGRESS NOTE    John Stark  VQQ:[id number] DOB: Nov 20, 1935 DOA: 01/28/2018 PCP: Alroy Dust, L.Marlou Sa, MD    Brief Narrative: John Stark is a 82 y.o. male with medical history significant for CHF, HTN,  HLD, GERD, COPD, stage III chronic kidney disease permanent atrial fibrillation status post prior DCCV with recurrence, presented to the ED with complaints of shortness of breath of 2 weeks duration, 10 pound weight gain and worsening bilateral lower extremity swelling in the context of missing Lasix doses and eating salty food.  Admitted for decompensated CHF.  New cardiomyopathy noted.  Cardiology consulted.  Hospital course complicated by acute on chronic kidney injury.  Lasix discontinued.  S/P right and left heart cath and coronary angiography 8/9.  Cardiology adjusting medications.  Slowly improving.  Assessment & Plan:   Principal Problem:   Acute exacerbation of CHF (congestive heart failure) (HCC) Active Problems:   Overweight   Essential hypertension   DIASTOLIC HEART FAILURE, CHRONIC   Atrial fibrillation (HCC)   COPD (chronic obstructive pulmonary disease) (HCC)   Acute on chronic combined systolic and diastolic CHF (congestive heart failure) (HCC)   Elevated troponin   Coronary artery disease involving native coronary artery of native heart without angina pectoris   Acute on chronic combined systolic and diastolic CHF Likely precipitated by poor compliance to diuretics and low-salt diet and new cardiomyopathy.  He was diuresed with Lasix IV with improvement.  -6.7 L since admission.  Weight down by 7 kgs since admission.  Lasix discontinued 8/8 due to worsening renal insufficiency.  Xarelto held and heparin per pharmacy started 8/7 precath.  Aspirin 81 mg daily also started.   Underwent right and left heart cath and coronary angiography 02/01/2018   which showed severe 2 vessel coronary artery disease with 80-90% proximal LAD stenosis and chronic occlusion of the mid  RCA, severe pulmonary hypertension,equalization of end-diastolic pressures, restrictive cardiomyopathy, advanced heart failure consultation recommended They suspect main issue is PAH and RV failure due to COPD and OSA/OHS and may have a component of restriction.   After initial diuresis with Lasix patient has been switched to torsemide 40 mg by mouth daily Repeat cardiac cath 8/13 status post DES. Recommendation is to resume Xarelto on 8/14. Concurrent outpatient therapy with aspirin 81 mg daily for one month and Plavix 75 mg daily for 6 months. If there is bleeding issue.stop Plavix after a minimum of 1 month. Follow renal panel   Cardiomyopathy/CAD As per cardiology follow-up, has CTO mid RCA and 80-90% stenosis in ostial/proximal LAD, creatinine has stabilized and they favor PCI possibly for 8/13.  He will need to be loaded with Plavix prior to PCI and will eventually need to restart Xarelto.  Cannot tolerate statins and outpatient referral to lipid clinic for [place]. As per cardiology, not candidate for cardiac MRI due to size, will likely be referred for PYP scan as outpatient and will need outpatient sleep study to evaluate for OSA/OHS.  Pulmonary hypertension with cor pulmonale Severe pulmonary hypertension by cath 8/9.  Management as above.  Permanent atrial fibrillation Xarelto changed to IV heparin pre cardiac cath.  Continue beta-blockers (metoprolol changed to Toprol-XL 50 mg daily).  Albuterol switched to Xopenex.  TSH normal.  Controlled ventricular rate.  Resume Xarelto after PCI 8/13.  He is now on aspirin 81 mg and cardiology will load with Plavix prior to PCI  NSVT 6 beat NSVT noted on telemetry on 01/31/2018 at 2:32 PM.  Ischemic evaluation as above.  Continue  beta-blockers.  No further episodes noted.  Wheezing/COPD exacerbation versus cardiac asthma versus VCD Patient had mostly upper airway wheezing but not stridor on 8/7.  Otherwise no clinical bronchospasm.  Change IV  Solu-Medrol to oral prednisone taper.  Added PPI to Pepcid.  Improving.  Bronchospasm resolved.  Weaning prednisone.  Acute respiratory failure with hypoxia Likely multifactorial related to COPD exacerbation, decompensated CHF, severe pulmonary hypertension and cor pulmonale, OSA and OHS.  May need home oxygen.  Acute on stage III chronic kidney disease: ACEI was discontinued 8/7 given bump in his creatinine.  Baseline creatinine may be in the 1.2 range.  Creatinine has worsened from 1.48-1.83, likely due to diuresis.  IV Lasix discontinued 8/8.  Creatinine continues to gradually improve, down to  1.38, on 8/13 Continue to trend daily BMP.  Essential hypertension Continue metoprolol & doxazosin.  ACEI discontinued.  Mildly uncontrolled and fluctuating.  Morbid obesity/Body mass index is 40.04 kg/m.  Visual changes: As per cardiology, had these 6 weeks ago and suspected amaurosis fugax but stroke felt less likely due to being on Xarelto.  CT head 8/7 showed no acute findings but some concern for NPH which can be evaluated as outpatient.    Anemia Follow CBCs periodically.  Stable.  Hyperlipidemia:  Intolerant of statins.  Consider PCSK9 inhibitor as outpatient.   DVT prophylaxis: Xarelto discontinued and IV heparin started 8/7 Code Status: Full code.  Family Communication: None at bedside today. Disposition Plan: DC home pending clinical improvement and cardiology evaluation, may take several days.   Consultants:   Cardiology   Procedures:   ECHO  PROCEDURE:  Procedure(s): RIGHT/LEFT HEART CATH AND CORONARY ANGIOGRAPHY (N/A)  SURGEON:  Surgeon(s) and Role:    * End, Christopher, MD - Primary  FINDINGS: 1. 2-vessel CAD with 80-90% ostial/proximal LAD stenosis and CTO of the mid RCA. 2. Mildly elevated left heart filling pressure. 3. Moderately elevated right heart filling pressure. 4. Severe pulmonary hypertension. 5. Normal Fick  CO/CI.  RECOMMENDATIONS: 1. Equalization of end-diastolic pressures with severe pulmonary hypertension suggestive of restrictive cardiomyopathy.  Consider advanced HF consultation for optimization of volume status. 2. Aggressive secondary prevention and medical therapy.  Once he is optimized from a HF standpoint, high risk PCI to the ostial/proximal LAD will need to be considered. 3. Close f/u of renal function.  Antimicrobials:  Doxycycline discontinued after single dose on 8/5  Subjective: Denies complaints. States that he is having stents tomorrow. Leg swellings much improved Denies SOB or CP.  Objective: Vitals:   02/04/18 2045 02/05/18 0522 02/05/18 0847 02/05/18 0849  BP: (!) 143/97 (!) 146/92    Pulse: (!) 111 (!) 58    Resp: 20 18    Temp: 98.5 F (36.9 C) (!) 97.5 F (36.4 C)    TempSrc: Oral Oral    SpO2: 92% 99% 93% 93%  Weight:  130.2 kg    Height:        Intake/Output Summary (Last 24 hours) at 02/05/2018 0903 Last data filed at 02/05/2018 0011 Gross per 24 hour  Intake 594 ml  Output 400 ml  Net 194 ml   Filed Weights   02/03/18 0500 02/04/18 0411 02/05/18 0522  Weight: 134.5 kg 130.4 kg 130.2 kg    Examination:  General exam: Pleasant elderly male, moderately built and morbidly obese lying comfortably supine in bed in left lateral position. Respiratory system: Clear to auscultation without wheezing, rhonchi or crackles.  No increased work of breathing. Cardiovascular system: S1 and S2 heard, irregularly  irregular.  No JVD.  No ankle edema.  Telemetry personally reviewed: A. fib with controlled ventricular rate.  Stable Gastrointestinal system: Abdomen is nondistended, soft and nontender. No organomegaly or masses felt. Normal bowel sounds heard.  Stable Central nervous system: Alert and oriented. No focal neurological deficits.  Stable Extremities: Symmetric 5 x 5 power. Skin: No rashes, lesions or ulcers   Data Reviewed: I have personally reviewed  following labs and imaging studies  CBC: Recent Labs  Lab 01/31/18 0303 02/01/18 0539 02/02/18 0613 02/04/18 0339 02/05/18 0451  WBC 15.9* 11.4* 9.5 12.4* 12.6*  NEUTROABS  --   --   --  10.1* 10.1*  HGB 11.9* 11.4* 11.4* 12.4* 11.8*  HCT 37.6* 35.7* 36.3* 38.8* 37.1*  MCV 94.7 95.2 96.5 93.7 94.2  PLT 181 168 164 160 174   Basic Metabolic Panel: Recent Labs  Lab 02/01/18 0539 02/02/18 0613 02/03/18 0535 02/04/18 0339 02/05/18 0451  NA 140 141 140 138 139  K 4.5 4.6 4.3 4.4 4.5  CL 104 107 106 99 101  CO2 27 26 26 30 29   GLUCOSE 104* 90 115* 101* 106*  BUN 36* 33* 34* 37* 31*  CREATININE 1.55* 1.44* 1.39* 1.52* 1.38*  CALCIUM 8.5* 8.4* 8.2* 8.9 8.6*   GFR: Estimated Creatinine Clearance: 58.8 mL/min (A) (by C-G formula based on SCr of 1.38 mg/dL (H)).  Cardiac Enzymes: Recent Labs  Lab 01/29/18 0936  TROPONINI 0.09*     Radiology Studies: No results found.      Scheduled Meds: . aspirin EC  81 mg Oral Daily  . colesevelam  1,250 mg Oral BID WC  . doxazosin  4 mg Oral QHS  . famotidine  20 mg Oral Daily  . finasteride  5 mg Oral QHS  . ipratropium  0.5 mg Nebulization BID  . levalbuterol  0.63 mg Nebulization BID  . metoprolol succinate  50 mg Oral Daily  . mometasone-formoterol  2 puff Inhalation BID  . predniSONE  20 mg Oral Q breakfast  . sodium chloride flush  3 mL Intravenous Q12H  . sodium chloride flush  3 mL Intravenous Q12H  . torsemide  40 mg Oral Daily   Continuous Infusions: . sodium chloride    . sodium chloride    . sodium chloride 10 mL/hr at 02/05/18 0514  . heparin 1,300 Units/hr (02/04/18 0400)     LOS: 8 days    Time spent: 25 minutes.    Reyne Dumas, MD, FACP, FHM. [facility] Pager [phone]  If 7PM-7AM, please contact night-coverage [email or web address] Password [email or web address] 02/05/2018, 9:03 AM

## 2018-02-06 DIAGNOSIS — I5023 Acute on chronic systolic (congestive) heart failure: Secondary | ICD-10-CM

## 2018-02-06 LAB — COMPREHENSIVE METABOLIC PANEL
ALK PHOS: 43 U/L (ref 38–126)
ALT: 35 U/L (ref 0–44)
AST: 18 U/L (ref 15–41)
Albumin: 2.6 g/dL — ABNORMAL LOW (ref 3.5–5.0)
Anion gap: 8 (ref 5–15)
BILIRUBIN TOTAL: 1.2 mg/dL (ref 0.3–1.2)
BUN: 34 mg/dL — AB (ref 8–23)
CO2: 28 mmol/L (ref 22–32)
CREATININE: 1.39 mg/dL — AB (ref 0.61–1.24)
Calcium: 8.7 mg/dL — ABNORMAL LOW (ref 8.9–10.3)
Chloride: 100 mmol/L (ref 98–111)
GFR calc Af Amer: 54 mL/min — ABNORMAL LOW (ref >60)
GFR, EST NON AFRICAN AMERICAN: 46 mL/min — AB (ref >60)
Glucose, Bld: 100 mg/dL — ABNORMAL HIGH (ref 70–99)
Potassium: 4.4 mmol/L (ref 3.5–5.1)
Sodium: 136 mmol/L (ref 135–145)
TOTAL PROTEIN: 5.6 g/dL — AB (ref 6.5–8.1)

## 2018-02-06 LAB — CBC WITH DIFFERENTIAL/PLATELET
ABS IMMATURE GRANULOCYTES: 0.1 10*3/uL (ref 0.0–0.1)
Basophils Absolute: 0 10*3/uL (ref 0.0–0.1)
Basophils Relative: 0 %
Eosinophils Absolute: 0.1 10*3/uL (ref 0.0–0.7)
Eosinophils Relative: 1 %
HEMATOCRIT: 36.2 % — AB (ref 39.0–52.0)
Hemoglobin: 12 g/dL — ABNORMAL LOW (ref 13.0–17.0)
IMMATURE GRANULOCYTES: 1 %
LYMPHS ABS: 1.2 10*3/uL (ref 0.7–4.0)
Lymphocytes Relative: 10 %
MCH: 30.6 pg (ref 26.0–34.0)
MCHC: 33.1 g/dL (ref 30.0–36.0)
MCV: 92.3 fL (ref 78.0–100.0)
MONOS PCT: 10 %
Monocytes Absolute: 1.2 10*3/uL — ABNORMAL HIGH (ref 0.1–1.0)
NEUTROS ABS: 9.4 10*3/uL — AB (ref 1.7–7.7)
NEUTROS PCT: 78 %
Platelets: 151 10*3/uL (ref 150–400)
RBC: 3.92 MIL/uL — ABNORMAL LOW (ref 4.22–5.81)
RDW: 15.6 % — ABNORMAL HIGH (ref 11.5–15.5)
WBC: 12 10*3/uL — ABNORMAL HIGH (ref 4.0–10.5)

## 2018-02-06 MED ORDER — METOPROLOL SUCCINATE ER 50 MG PO TB24
150.0000 mg | ORAL_TABLET | Freq: Every day | ORAL | Status: DC
  Administered 2018-02-06 – 2018-02-08 (×3): 150 mg via ORAL
  Filled 2018-02-06 (×3): qty 3

## 2018-02-06 MED ORDER — TORSEMIDE 20 MG PO TABS: 40.0000 mg | ORAL_TABLET | Freq: Every day | ORAL | 0 refills | Status: DC

## 2018-02-06 MED ORDER — LEVALBUTEROL HCL 0.63 MG/3ML IN NEBU: 0.6300 mg | INHALATION_SOLUTION | Freq: Four times a day (QID) | RESPIRATORY_TRACT | Status: DC | PRN

## 2018-02-06 MED ORDER — METOPROLOL SUCCINATE ER 50 MG PO TB24: 150.0000 mg | ORAL_TABLET | Freq: Every day | ORAL | 0 refills | Status: DC

## 2018-02-06 MED ORDER — CLOPIDOGREL BISULFATE 75 MG PO TABS: 75.0000 mg | ORAL_TABLET | Freq: Every day | ORAL | 0 refills | Status: DC

## 2018-02-06 MED ORDER — ASPIRIN 81 MG PO CHEW: 81.0000 mg | CHEWABLE_TABLET | Freq: Every day | ORAL | Status: DC

## 2018-02-06 MED ORDER — METOPROLOL SUCCINATE ER 50 MG PO TB24: 100.0000 mg | ORAL_TABLET | Freq: Every day | ORAL | Status: DC

## 2018-02-06 MED FILL — Nitroglycerin IV Soln 100 MCG/ML in D5W: INTRA_ARTERIAL | Qty: 10 | Status: AC

## 2018-02-06 NOTE — NC FL2 (Signed)
[place] LEVEL OF CARE SCREENING TOOL     IDENTIFICATION  Patient Name: John Stark Birthdate: Apr 07, 1936 Sex: male Admission Date (Current Location): 01/28/2018  [facility] and [place] Number:  Herbalist and Address:  The . [facility], [place] [address] [place], [place] [address]      Provider Number: [id number]  Attending Physician Name and Address:  Nita Sells, MD  Relative Name and Phone Number:  Jafari Mckillop - wife, [phone]    Current Level of Care: Hospital Recommended Level of Care: [place] Prior Approval Number:    Date Approved/Denied:   PASRR Number: [id number](Eff. 08/12/12)  Discharge Plan: SNF    Current Diagnoses: Patient Active Problem List   Diagnosis Date Noted  . Coronary artery disease involving native coronary artery of native heart without angina pectoris   . Acute on chronic combined systolic and diastolic CHF (congestive heart failure) ([place])   . Elevated troponin   . COPD (chronic obstructive pulmonary disease) ([place]) 01/29/2018  . Acute exacerbation of CHF (congestive heart failure) ([place]) 01/28/2018  . Osteoarthritis of right knee 08/07/2012  . Atrial fibrillation ([place]) 06/05/2012  . DIASTOLIC HEART FAILURE, CHRONIC 06/15/2009  . HYPERLIPIDEMIA-MIXED 12/04/2008  . Overweight 12/04/2008  . Essential hypertension 12/04/2008  . GERD 12/04/2008  . OSTEOARTHRITIS 12/04/2008  . PALPITATIONS 12/04/2008    Orientation RESPIRATION BLADDER Height & Weight     Self, Time, Situation, Place  Normal Continent Weight: 288 lb 5.8 oz (130.8 kg) Height:  5\' 11"  (180.3 cm)  BEHAVIORAL SYMPTOMS/MOOD NEUROLOGICAL BOWEL NUTRITION STATUS      Continent Diet(Low sodium - Heart healthy)  AMBULATORY STATUS COMMUNICATION OF NEEDS Skin   Limited Assist(Min Guard) Verbally Other (Comment)(Abrasion left arm/leg; Cellulitis left leg; Ecchymosis right/left arm)                        Personal Care Assistance Level of Assistance  Bathing, Feeding, Dressing Bathing Assistance: Limited assistance Feeding assistance: Independent Dressing Assistance: Limited assistance     Functional Limitations Info  Sight, Hearing, Speech Sight Info: Impaired Hearing Info: Impaired(Hearing loss in right ear) Speech Info: Adequate    SPECIAL CARE FACTORS FREQUENCY  PT (By licensed PT)     PT Frequency: Evaluated 8/7 and a minimum of 3X per week therapy recommended during acute inpatient stay              Contractures Contractures Info: Not present    Additional Factors Info  Code Status, Allergies Code Status Info: Full Allergies Info: Crestor, Pravastatin, Simvastatin           Current Medications (02/06/2018):  This is the current hospital active medication list Current Facility-Administered Medications  Medication Dose Route Frequency Provider Last Rate Last Dose  . 0.9 %  sodium chloride infusion  250 mL Intravenous PRN Larae Grooms S, MD      . 0.9 %  sodium chloride infusion  250 mL Intravenous PRN Jettie Booze, MD      . acetaminophen (TYLENOL) tablet 650 mg  650 mg Oral Q4H PRN Jettie Booze, MD      . aspirin chewable tablet 81 mg  81 mg Oral Daily Jettie Booze, MD   81 mg at 02/06/18 0754  . clopidogrel (PLAVIX) tablet 75 mg  75 mg Oral Q breakfast Jettie Booze, MD   75 mg at 02/06/18 0754  . colesevelam [facility]) tablet 1,250 mg  1,250 mg  Oral BID WC Jettie Booze, MD   1,250 mg at 02/06/18 0754  . doxazosin (CARDURA) tablet 4 mg  4 mg Oral QHS Jettie Booze, MD   4 mg at 02/05/18 2118  . famotidine (PEPCID) tablet 20 mg  20 mg Oral Daily Jettie Booze, MD   20 mg at 02/06/18 0755  . finasteride (PROSCAR) tablet 5 mg  5 mg Oral QHS Jettie Booze, MD   5 mg at 02/05/18 2118  . levalbuterol (XOPENEX) nebulizer solution 0.63 mg  0.63 mg Nebulization Q6H PRN Reyne Dumas, MD      . metoprolol  succinate (TOPROL-XL) 24 hr tablet 150 mg  150 mg Oral QPC supper Skeet Latch, MD      . mometasone-formoterol [facility]) 200-5 MCG/ACT inhaler 2 puff  2 puff Inhalation BID Jettie Booze, MD   2 puff at 02/06/18 0920  . ondansetron (ZOFRAN) injection 4 mg  4 mg Intravenous Q6H PRN Jettie Booze, MD      . ondansetron [facility]) tablet 4 mg  4 mg Oral Q6H PRN Jettie Booze, MD      . polyethylene glycol (MIRALAX / GLYCOLAX) packet 17 g  17 g Oral Daily PRN Jettie Booze, MD      . predniSONE (DELTASONE) tablet 20 mg  20 mg Oral Q breakfast Jettie Booze, MD   20 mg at 02/06/18 0754  . rivaroxaban (XARELTO) tablet 20 mg  20 mg Oral Q supper [place]ford, Berg, RPH      . sodium chloride flush (NS) 0.9 % injection 3 mL  3 mL Intravenous Q12H Jettie Booze, MD   3 mL at 02/05/18 2119  . sodium chloride flush (NS) 0.9 % injection 3 mL  3 mL Intravenous PRN Larae Grooms S, MD      . sodium chloride flush (NS) 0.9 % injection 3 mL  3 mL Intravenous Q12H Larae Grooms S, MD      . sodium chloride flush (NS) 0.9 % injection 3 mL  3 mL Intravenous PRN Jettie Booze, MD      . torsemide [facility]) tablet 40 mg  40 mg Oral Daily Jettie Booze, MD   40 mg at 02/05/18 4825     Discharge Medications: Please see discharge summary for a list of discharge medications.  Relevant Imaging Results:  Relevant Lab Results:   Additional Information [phone]; 288 pounds  Sharlet Salina Mila Homer, [place]

## 2018-02-06 NOTE — Progress Notes (Signed)
Patient seen and examined and interviewed has 14 steps at home and hence will be unable to return home just yet-PT evaluation noted after I saw the patient this morning and they are recommending skilled facility please see d/c summary dated 8/15

## 2018-02-06 NOTE — Care Management Important Message (Signed)
Important Message  Patient Details  Name: John Stark MRN: [id number] Date of Birth: [birth year]   Medicare Important Message Given:  Yes    Shavone Nevers P [place] 02/06/2018, 9:14 AM

## 2018-02-06 NOTE — Discharge Summary (Addendum)
Physician Discharge Summary  John Stark LFY:[id number] DOB: Nov 09, 1935 DOA: 01/28/2018  PCP: Alroy Dust, L.Marlou Sa, MD  Admit date: 01/28/2018 Discharge date: 02/06/2018  Time spent: 45 minutes  Recommendations for Outpatient Follow-up:  1. Note medication changes metoprolol to metoprolol ER 150 daily 2. Adedd taking aspirin daily, Plavix and Xarelto for 1 month-then de-escalate in 6 months off of Plavix to just Xarelto and this can be guided by cardiology 3. Get labs basic metabolic panel in about 1 week  4. Will need outpatient exercise rehab and potential rehab 5. OP-Patient sleep study be coordinated by PCP 6. Will need cardiac follow-up in 7 to 10 days-may need PYP scan as OP 7. ACE inhibitor held this admission will need resumption of the same stone labs 8. Added flomax this admit ion addition to Cardura for Weak stream--consider adding Mybetriq and d/c cardura if no better in 3-5 d 9. D/c to snf  Discharge Diagnoses:  Principal Problem:   Acute exacerbation of CHF (congestive heart failure) (HCC) Active Problems:   Overweight   Essential hypertension   DIASTOLIC HEART FAILURE, CHRONIC   Atrial fibrillation (HCC)   COPD (chronic obstructive pulmonary disease) (HCC)   Acute on chronic combined systolic and diastolic CHF (congestive heart failure) (HCC)   Elevated troponin   Coronary artery disease involving native coronary artery of native heart without angina pectoris   Discharge Condition: improved  Diet recommendation: hh low salt  Filed Weights   02/04/18 0411 02/05/18 0522 02/06/18 0519  Weight: 130.4 kg 130.2 kg 130.8 kg    History of present illness:  43 m, known hfpef htn hld gerd copd stg ii ckdchr afib chad>6 s/p dccv in the past Admit with decomp CHF-new CM noted on echo Had L + R Cath Eventual DES done See below  Hospital Course:  Acute bimodal CHF-initially diuresed, Lasix held 8/8 secondary to renal insufficiency-cath 8/9 showed PAH RV failure-we will  need outpatient sleep study and evaluation-see below-total diuresis for this admission -15 L weight down from 136 to 127 kg Severe Pulm Htn-needs sleep study as cath showed elevated hemodynamics Cardiomyopathy-patient has CTO mid RCA and 80 to 90% stenosis-PCI performed 8/13 loaded with Plavix and going home on Xarelto Plavix aspirin as per above-as cannot tolerate statins will need outpatient referral to lipid clinic and will need outpatient PYP scan as cannot fit on table for cardiac MRI Perm Afib [place] >5-complicated by some SVT-on Xarelto-rates were faster and prior to discharge metoprolol was adjusted as above COPD-stable during hospital stay Acute resp failure-initially presented with acute hypoxic respiratory failure secondary to volume overload and diuresis as above AKI on ckd iii-see above-going home on Demadex needs outpatient reevaluation and labs htn Normocytic anemia Body mass index is 39.27 kg/m.-Life-threatening    Procedures:  Cardiac cath x2  DES on 8/13  Echo   Consultations:  Cardiology  Discharge Exam: Vitals:   02/06/18 0519 02/06/18 0711  BP: (!) 135/93 131/87  Pulse: 85 97  Resp: 11 18  Temp: 98 F (36.7 C) 97.8 F (36.6 C)  SpO2: 92% 94%    General: In good spirits sitting up in bed no new issues no JVD no bruit Cardiovascular: S1-S2 no murmur rub or gallop Respiratory: Clinically clear Abdomen soft nontender no rebound no guarding Mild lower extremity edema Neurologically intact Psych euthymic  Discharge Instructions   Discharge Instructions    AMB Referral to Cardiac Rehabilitation - Phase II   Complete by:  As directed    Diagnosis:  Coronary Stents NSTEMI     Diet - low sodium heart healthy   Complete by:  As directed    Discharge instructions   Complete by:  As directed    You will notice that your medications have changed significantly and you will need to increase the amount of metoprolol that you take--in addition you will be  on aspirin, Plavix and then Xarelto for at least 1 month--- after 1 month discontinue aspirin and subsequently talk to your cardiologist about what medications you should be on I would recommend you continue the Demadex at the dose that you have been on You will need labs in about 1 week If needed we will get home health to see you otherwise you will need to go to get rehab at a facility   Increase activity slowly   Complete by:  As directed      Allergies as of 02/07/2018      Reactions   Crestor [rosuvastatin Calcium] Other (See Comments)   Muscle aches    Pravastatin Other (See Comments)   Muscle aches    Simvastatin Other (See Comments)   Muscle aches       Medication List    STOP taking these medications   benazepril 10 MG tablet Commonly known as:  LOTENSIN   celecoxib 200 MG capsule Commonly known as:  CELEBREX   furosemide 40 MG tablet Commonly known as:  LASIX   metoprolol tartrate 50 MG tablet Commonly known as:  LOPRESSOR     TAKE these medications   amLODipine 5 MG tablet Commonly known as:  NORVASC Take 5 mg by mouth daily.   aspirin 81 MG chewable tablet Chew 1 tablet (81 mg total) by mouth daily.   CARDURA 4 MG tablet Generic drug:  doxazosin Take 4 mg by mouth at bedtime.   clopidogrel 75 MG tablet Commonly known as:  PLAVIX Take 1 tablet (75 mg total) by mouth daily.   cyclobenzaprine 5 MG tablet Commonly known as:  FLEXERIL Take 1 tablet (5 mg total) by mouth at bedtime and may repeat dose one time if needed.   famotidine 20 MG tablet Commonly known as:  PEPCID Take 1 tablet (20 mg total) by mouth daily.   Fluticasone-Salmeterol 250-50 MCG/DOSE Aepb Commonly known as:  ADVAIR Inhale 1 puff into the lungs 2 (two) times daily.   JUICE PLUS FIBRE PO Take 6 tablets by mouth daily. 2 nuts tablets, 2 fruit tablets, and 2 veggie tablets   meclizine 25 MG tablet Commonly known as:  ANTIVERT Take 25 mg by mouth 3 (three) times daily as needed  for dizziness.   metoprolol succinate 50 MG 24 hr tablet Commonly known as:  TOPROL-XL Take 3 tablets (150 mg total) by mouth daily after supper. Take with or immediately following a meal.   potassium chloride 10 MEQ CR capsule Commonly known as:  MICRO-K Take 10 mEq by mouth daily.   PROAIR HFA 108 (90 Base) MCG/ACT inhaler Generic drug:  albuterol Inhale 2 puffs into the lungs every 4 (four) hours as needed for shortness of breath or wheezing.   PROSCAR 5 MG tablet Generic drug:  finasteride Take 5 mg by mouth at bedtime.   tamsulosin 0.4 MG Caps capsule Commonly known as:  FLOMAX Take 1 capsule (0.4 mg total) by mouth daily after supper.   torsemide 20 MG tablet Commonly known as:  DEMADEX Take 2 tablets (40 mg total) by mouth daily.   vitamin B-12 100 MCG tablet  Commonly known as:  CYANOCOBALAMIN Take 100 mcg by mouth daily.   WELCHOL 625 MG tablet Generic drug:  colesevelam Take 1,250 mg by mouth 2 (two) times daily with a meal.   XARELTO 20 MG Tabs tablet Generic drug:  rivaroxaban TAKE 1 TABLET BY MOUTH ONCE DAILY      Allergies  Allergen Reactions  . Crestor [Rosuvastatin Calcium] Other (See Comments)    Muscle aches   . Pravastatin Other (See Comments)    Muscle aches   . Simvastatin Other (See Comments)    Muscle aches    Follow-up Information    Imogene Burn, PA-C Follow up.   Specialty:  Cardiology Why:  Cardiology hospital follow up on August 28th at 11:00. Please arrive 15 minutes early for check in.  Contact information: [address]  [place] [address] [phone]            The results of significant diagnostics from this hospitalization (including imaging, microbiology, ancillary and laboratory) are listed below for reference.    Significant Diagnostic Studies: Dg Chest 2 View  Result Date: 01/28/2018 CLINICAL DATA:  Shortness of breath.  History of COPD. EXAM: CHEST - 2 VIEW COMPARISON:  Chest x-ray dated  05/21/2012 and abdominal CT scan dated 12/20/2017 FINDINGS: There is new cardiomegaly with small bilateral pleural effusions. There is slight new pulmonary vascular prominence. Bilateral calcified granulomas. Tortuosity and calcification of the thoracic aorta. Neurostimulator wires in the midthoracic spine. No significant bone abnormality. Moderate hiatal hernia IMPRESSION: Findings consistent with congestive heart failure with new cardiomegaly. Pulmonary vascular congestion, and small pleural effusions. No discrete pulmonary edema. Electronically Signed   By: Lorriane Shire M.D.   On: 01/28/2018 16:33   Ct Head Wo Contrast  Result Date: 01/30/2018 CLINICAL DATA:  Brief LEFT visual field cut 6 weeks ago. Evaluate amaurosis fugax. History of carotid artery disease, hypertension, hyperlipidemia. EXAM: CT HEAD WITHOUT CONTRAST TECHNIQUE: Contiguous axial images were obtained from the base of the skull through the vertex without intravenous contrast. COMPARISON:  None. FINDINGS: BRAIN: No intraparenchymal hemorrhage, mass effect nor midline shift. Moderate to severe global parenchymal brain volume loss, moderate lobulated ventricular margin with mild sulcal effacement at the convexities. No hydrocephalus. Patchy supratentorial white matter hypodensities within normal range for patient's age, though non-specific are most compatible with chronic small vessel ischemic disease. No acute large vascular territory infarcts. No abnormal extra-axial fluid collections. Basal cisterns are patent. VASCULAR: Mild calcific atherosclerosis of the carotid siphons. SKULL: No skull fracture. No significant scalp soft tissue swelling. SINUSES/ORBITS: Trace paranasal sinus mucosal thickening. Subcentimeter frontoethmoidal osteomas. Mastoid air cells are well aerated.The included ocular globes and orbital contents are non-suspicious. Status post bilateral ocular lens implants. OTHER: None. IMPRESSION: 1. No acute intracranial process. 2.  Moderate very to severe parenchymal brain volume loss with suspected component of normal pressure hydrocephalus. 3. Moderate chronic small vessel ischemic changes. Electronically Signed   By: Elon Alas M.D.   On: 01/30/2018 16:54    Microbiology: No results found for this or any previous visit (from the past 240 hour(s)).   Labs: Basic Metabolic Panel: Recent Labs  Lab 02/02/18 0613 02/03/18 0535 02/04/18 0339 02/05/18 0451 02/06/18 0331  NA 141 140 138 139 136  K 4.6 4.3 4.4 4.5 4.4  CL 107 106 99 101 100  CO2 26 26 30 29 28   GLUCOSE 90 115* 101* 106* 100*  BUN 33* 34* 37* 31* 34*  CREATININE 1.44* 1.39* 1.52* 1.38* 1.39*  CALCIUM 8.4* 8.2* 8.9 8.6* 8.7*   Liver Function Tests: Recent Labs  Lab 02/06/18 0331  AST 18  ALT 35  ALKPHOS 43  BILITOT 1.2  PROT 5.6*  ALBUMIN 2.6*   No results for input(s): LIPASE, AMYLASE in the last 168 hours. No results for input(s): AMMONIA in the last 168 hours. CBC: Recent Labs  Lab 02/01/18 0539 02/02/18 0613 02/04/18 0339 02/05/18 0451 02/06/18 0331  WBC 11.4* 9.5 12.4* 12.6* 12.0*  NEUTROABS  --   --  10.1* 10.1* 9.4*  HGB 11.4* 11.4* 12.4* 11.8* 12.0*  HCT 35.7* 36.3* 38.8* 37.1* 36.2*  MCV 95.2 96.5 93.7 94.2 92.3  PLT 168 164 160 151 151   Cardiac Enzymes: No results for input(s): CKTOTAL, CKMB, CKMBINDEX, TROPONINI in the last 168 hours. BNP: BNP (last 3 results) Recent Labs    01/28/18 2039  BNP 1,523.4*    ProBNP (last 3 results) No results for input(s): PROBNP in the last 8760 hours.  CBG: No results for input(s): GLUCAP in the last 168 hours.     Signed:  Nita Sells MD   [facility] 02/06/2018, 11:12 AM

## 2018-02-06 NOTE — Progress Notes (Addendum)
CARDIAC REHAB PHASE I   PRE:  Rate/Rhythm: 95 afib    BP: sitting 143/92    SaO2: 92-94 RA  MODE:  Ambulation: 160 ft   POST:  Rate/Rhythm: 132 afib    BP: sitting 165/131, recheck 145/88     SaO2: 95 RA  Pt in bed, wanted assist to get to EOB. Used armrail. Apparently at home he relies on his wife much of the time with assistance. She apparently uses a walking stick herself. He was able to walk in hall with RW, slow, steady. Rest x3 for SOB and HR up to 130s. SAO2 maintained > 90 RA. Pt tried to use bed railing getting to recliner. Instructed him that the RW is than "cruising furniture"  He was able to navigate obstacles and get to recliner with many verbal cues, HR hit 140s. Ed completed with pt and wife. Much instruction in HF, daily wts, low sodium. He understands the importance of Plavix. Will refer to [facility]. Pt will need CRPII before Pulmonary Rehab.  [phone]   [place], ACSM 02/06/2018 9:35 AM

## 2018-02-06 NOTE — Progress Notes (Addendum)
Physical Therapy Treatment Patient Details Name: John Stark MRN: [id number] DOB: 01-18-1936 Today's Date: 02/06/2018    History of Present Illness Pt is an 82 y.o. male admitted 01/28/18 with CHF and SOB. Cath 8/9 showed severe 2 vessel CAD; deemed not a CABG candidate. Underwent successful ostial LAD PCI on 8/13. PMH includes CHF, HTN, atrial fibrillation   PT Comments    Pt slowly progressing with mobility. Ambwith RW and close min guard; remains limited by decreased activity tolerance and DOE. Pt able to ascend/descend 4 steps before needing to stop due to fatigue (has 14 steps he will have to climb at home). HR up to 153 with ambulation. Pt remains at increased risk for falls. Wife not able to provide consistent physical assist at home; pt will need to be at least supervision-level for mobility to allow safe return home. Discussed recommendation for short-term SNF-level therapies to maximize functional mobility and independence; pt and family in agreement with this. Will follow acutely.      Follow Up Recommendations  SNF;Supervision for mobility/OOB     Equipment Recommendations  None recommended by PT    Recommendations for Other Services       Precautions / Restrictions Precautions Precautions: Fall    Mobility  Bed Mobility               General bed mobility comments: Received sitting in recliner  Transfers Overall transfer level: Needs assistance Equipment used: Rolling walker (2 wheeled) Transfers: Sit to/from Stand Sit to Stand: Min guard            Ambulation/Gait Ambulation/Gait assistance: Min guard Gait Distance (Feet): 80 Feet Assistive device: Rolling walker (2 wheeled) Gait Pattern/deviations: Step-through pattern;Decreased stride length;Trunk flexed Gait velocity: Decreased Gait velocity interpretation: <1.8 ft/sec, indicate of risk for recurrent falls General Gait Details: Slow amb with RW and min guard for balance. DOE 2/4, required  standing rest break with cues for deep breathing   Stairs Stairs: Yes Stairs assistance: Min guard Stair Management: One rail Left;Step to pattern;Sideways Number of Stairs: 4 General stair comments: Ascend/descended 2 steps x2 trials with BUE support on single rail; min guard for balance. DOE 3/4 requiring 2x standing rest with lean onto rail   Wheelchair Mobility    Modified Rankin (Stroke Patients Only)       Balance Overall balance assessment: Needs assistance Sitting-balance support: Feet supported Sitting balance-Leahy Scale: Good       Standing balance-Leahy Scale: Poor Standing balance comment: reliance on UE support                            Cognition Arousal/Alertness: Awake/alert Behavior During Therapy: WFL for tasks assessed/performed Overall Cognitive Status: Within Functional Limits for tasks assessed                                        Exercises      General Comments General comments (skin integrity, edema, etc.): Wife and daughter present at beginning of session, both hopeful for SNF      Pertinent Vitals/Pain Pain Assessment: No/denies pain    Home Living                      Prior Function            PT Goals (current goals can now be found  in the care plan section) Acute Rehab PT Goals Patient Stated Goal: Agreeable to rehab prior to returning home; states ultimate goal is to "lose a lot of weight" PT Goal Formulation: With patient Time For Goal Achievement: 02/13/18 Potential to Achieve Goals: Good Progress towards PT goals: Progressing toward goals    Frequency    Min 3X/week      PT Plan Current plan remains appropriate    Co-evaluation              AM-PAC PT "6 Clicks" Daily Activity  Outcome Measure  Difficulty turning over in bed (including adjusting bedclothes, sheets and blankets)?: Unable Difficulty moving from lying on back to sitting on the side of the bed? :  Unable Difficulty sitting down on and standing up from a chair with arms (e.g., wheelchair, bedside commode, etc,.)?: A Little Help needed moving to and from a bed to chair (including a wheelchair)?: A Little Help needed walking in hospital room?: A Little Help needed climbing 3-5 steps with a railing? : A Little 6 Click Score: 14    End of Session Equipment Utilized During Treatment: Gait belt Activity Tolerance: Patient tolerated treatment well Patient left: in chair;with call bell/phone within reach Nurse Communication: Mobility status PT Visit Diagnosis: Other abnormalities of gait and mobility (R26.89);Muscle weakness (generalized) (M62.81)     Time: 0160-1093 PT Time Calculation (min) (ACUTE ONLY): 18 min  Charges:  $Gait Training: 8-22 mins                     Mabeline Caras, PT, DPT Acute Rehab Services  Pager: [phone] 02/06/2018, 1:07 PM

## 2018-02-06 NOTE — Clinical Social Work Placement (Addendum)
   CLINICAL SOCIAL WORK PLACEMENT  NOTE 02/06/18 - [facility] COMM[facility] CHOSEN AND CAN ACCEPT PATIENT PENDING INSURANCE AUTHORIZATION  Date:  02/06/2018  Patient Details  Name: KOLBE DELMONACO MRN: [id number] Date of Birth: Nov 25, 1935  Clinical Social Work is seeking post-discharge placement for this patient at the [place] level of care (*CSW will initial, date and re-position this form in  chart as items are completed):  Yes   Patient/family provided with [facility] Work Department's list of facilities offering this level of care within the geographic area requested by the patient (or if unable, by the patient's family).  Yes   Patient/family informed of their freedom to choose among providers that offer the needed level of care, that participate in Medicare, Medicaid or managed care program needed by the patient, have an available bed and are willing to accept the patient.  Yes   Patient/family informed of [facility]'s ownership interest in [facility] and [facility], as well as of the fact that they are under no obligation to receive care at these facilities.  PASRR submitted to EDS on       PASRR number received on       Existing PASRR number confirmed on 02/06/18     FL2 transmitted to all facilities in geographic area requested by pt/family on 02/06/18     FL2 transmitted to all facilities within larger geographic area on       Patient informed that his/her managed care company has contracts with or will negotiate with certain facilities, including the following:         CSW provided with facility preferences by family on 8/14 - Patient/family informed of bed offers received.  Patient chooses bed at  [facility] recommends and patient chooses bed at      Patient to be transferred to  [facility] on  .  Patient to be transferred to facility by       Patient family notified on   of transfer.  Name of family  member notified:        PHYSICIAN       Additional Comment:    _______________________________________________ Sable Feil, LCSW 02/06/2018, 3:55 PM

## 2018-02-06 NOTE — Progress Notes (Signed)
Progress Note  Patient Name: John Stark Date of Encounter: 02/06/2018  Primary Cardiologist: Lauree Chandler, MD   Subjective   Feeling well.  His breathing is improving but not back to baseline.    Inpatient Medications    Scheduled Meds: . aspirin  81 mg Oral Daily  . clopidogrel  75 mg Oral Q breakfast  . colesevelam  1,250 mg Oral BID WC  . doxazosin  4 mg Oral QHS  . famotidine  20 mg Oral Daily  . finasteride  5 mg Oral QHS  . metoprolol succinate  100 mg Oral QPC supper  . mometasone-formoterol  2 puff Inhalation BID  . predniSONE  20 mg Oral Q breakfast  . rivaroxaban  20 mg Oral Q supper  . sodium chloride flush  3 mL Intravenous Q12H  . sodium chloride flush  3 mL Intravenous Q12H  . torsemide  40 mg Oral Daily   Continuous Infusions: . sodium chloride    . sodium chloride     PRN Meds: sodium chloride, sodium chloride, acetaminophen, levalbuterol, ondansetron (ZOFRAN) IV, ondansetron **OR** [DISCONTINUED] ondansetron (ZOFRAN) IV, polyethylene glycol, sodium chloride flush, sodium chloride flush   Vital Signs    Vitals:   02/05/18 1946 02/05/18 2000 02/06/18 0519 02/06/18 0711  BP: 131/74  (!) 135/93 131/87  Pulse: (!) 113 95 85 97  Resp: 20 17 11 18   Temp:   98 F (36.7 C) 97.8 F (36.6 C)  TempSrc:   Oral   SpO2: 100% 96% 92% 94%  Weight:   130.8 kg   Height:        Intake/Output Summary (Last 24 hours) at 02/06/2018 1031 Last data filed at 02/06/2018 0900 Gross per 24 hour  Intake 869.37 ml  Output 2725 ml  Net -1855.63 ml   Filed Weights   02/04/18 0411 02/05/18 0522 02/06/18 0519  Weight: 130.4 kg 130.2 kg 130.8 kg    Telemetry    Atrial fibrillation.  Rate 90s-110s.  - Personally Reviewed  ECG    n/a - Personally Reviewed  Physical Exam   VS:  BP 131/87 (BP Location: Right Arm)   Pulse 97   Temp 97.8 F (36.6 C)   Resp 18   Ht 5\' 11"  (1.803 m)   Wt 130.8 kg   SpO2 94%   BMI 40.22 kg/m  , BMI Body mass index  is 40.22 kg/m. GENERAL: Chronically ill appearing morbidly obese man sitting on side of bed in no acute distress HEENT: Pupils equal round and reactive, fundi not visualized, oral mucosa unremarkable NECK:  JVP 1 cm above clavicle sitting upright.  Waveform within normal limits, carotid upstroke brisk and symmetric, no bruits LUNGS:  Clear to auscultation bilaterally HEART:  Tachycardic.  Irregularly irregular.  PMI not displaced or sustained,S1 and S2 within normal limits, no S3, no S4, no clicks, no rubs, no murmurs ABD:  Flat, positive bowel sounds normal in frequency in pitch, no bruits, no rebound, no guarding, no midline pulsatile mass, no hepatomegaly, no splenomegaly EXT:  2 plus pulses throughout, 1+ LE edema bilaterally, no cyanosis no clubbing SKIN:  No rashes no nodules NEURO:  Cranial nerves II through XII grossly intact, motor grossly intact throughout PSYCH:  Cognitively intact, oriented to person place and time   Labs    Chemistry Recent Labs  Lab 02/04/18 0339 02/05/18 0451 02/06/18 0331  NA 138 139 136  K 4.4 4.5 4.4  CL 99 101 100  CO2 30 29 28  GLUCOSE 101* 106* 100*  BUN 37* 31* 34*  CREATININE 1.52* 1.38* 1.39*  CALCIUM 8.9 8.6* 8.7*  PROT  --   --  5.6*  ALBUMIN  --   --  2.6*  AST  --   --  18  ALT  --   --  35  ALKPHOS  --   --  43  BILITOT  --   --  1.2  GFRNONAA 42* 47* 46*  GFRAA 48* 54* 54*  ANIONGAP 9 9 8      Hematology Recent Labs  Lab 02/04/18 0339 02/05/18 0451 02/06/18 0331  WBC 12.4* 12.6* 12.0*  RBC 4.14* 3.94* 3.92*  HGB 12.4* 11.8* 12.0*  HCT 38.8* 37.1* 36.2*  MCV 93.7 94.2 92.3  MCH 30.0 29.9 30.6  MCHC 32.0 31.8 33.1  RDW 15.6* 15.6* 15.6*  PLT 160 151 151    Cardiac Enzymes No results for input(s): TROPONINI in the last 168 hours.  No results for input(s): TROPIPOC in the last 168 hours.   BNP No results for input(s): BNP, PROBNP in the last 168 hours.   DDimer No results for input(s): DDIMER in the last 168  hours.   Radiology    No results found.  Cardiac Studies   LHC 02/06/18:  Ost LAD lesion is 80% stenosed.  Prox LAD lesion is 50% stenosed.  A drug-eluting stent was successfully placed using a STENT ORSIRO 3.0X26, postdilated to 3.75 mm.  Post intervention, there is a 0% residual stenosis.     Recommend to resume Rivaroxaban, at currently prescribed dose and frequency, on 02/06/18.  Recommend concurrent antiplatelet therapy of Aspirin 81mg  daily for 1 month and Clopidogrel 75mg  daily for 6 months.    RHC/LHC 02/01/18: Conclusions: 1. Severe 2-vessel CAD with 80-90% ostial/proximal LAD stenosis and chronic total occlusion of the mid RCA. 2. Mildly elevated left heart filling pressure. 3. Moderately elevated right heart filling pressure. 4. Severe pulmonary hypertension. 5. Low normal Fick CO/CI.  Recommendations: 1. Equalization of end-diastolic pressures with severe pulmonary hypertension suggestive of restrictive cardiomyopathy. Consider advanced HF consultation for optimization of volume status. 2. Aggressive secondary prevention and medical therapy. Once he is optimized from a HF standpoint, high risk PCI to the ostial/proximal LAD will need to be considered. 3. Close follow-up of renal function.  Recommend to resume rivaroxaban, at currently prescribed dose and frequency, after decision regarding PCI to LAD.  If PCI is planned, recommend concurrent antiplatelet therapy of clopidogrel 75mg  daily for 12 months.  Restart heparin infusion 2 hours after TR band removal today.  Right Heart Pressures RA (mean): 18 mmHg RV (S/EDP): 75/20 mmHg PA (S/D, mean): 75/40 (52) mmHg PCWP (mean): Unable to obtain.  Ao sat: 96% PA sat: 59%  Fick CO: 5.8 L/min Fick CI: 2.3 L/min/m^2   [place] 01/29/18:  Study Conclusions  - Left ventricle: The cavity size was normal. Systolic function was   mildly to moderately reduced. The estimated ejection fraction was   in the range of 40% to  45%. Diffuse hypokinesis. There is   akinesis of the inferior myocardium. - Ventricular septum: The contour showed diastolic flattening. - Aortic valve: Valve mobility was restricted. There was trivial   regurgitation. - Aorta: Ascending aortic diameter: 41 mm (S). - Ascending aorta: The ascending aorta was mildly dilated. - Mitral valve: Calcified annulus. There was mild regurgitation. - Right ventricle: The cavity size was severely dilated. Wall   thickness was normal. Systolic function was moderately reduced. - Right atrium: The  atrium was severely dilated. - Pulmonary arteries: Systolic pressure was moderately increased.   PA peak pressure: 60 mm Hg (S).  Patient Profile     82 y.o. male with morbid obesity, permanent atrial fibrillation, hypertension, hyperlipidemia, COPD, GERD, bladder cancer s/p chemo/resection, CKD III here with acute on chronic systolic and diastolic heart failure.   Assessment & Plan    # Acute on chronic systolic and diastolic heart failure: # PAH with cor pulmonale: # Possible restrictive cardiomyopathy: Mostly right sided heart failure symptoms though LVEF reduced to 40-45%.  He has inferior akinesis and overall hypokinesis.  PASP was 60 mmHg on echo 01/29/18.  He was found to have equalization of end-diastolic pressures on left and right heart catheterization and severe pulmonary hypertension concerning for restrictive cardiomyopathy.  His right-sided heart failure is thought to be contributed to by obesity hypoventilation/OSA.  He will need an outpatient sleep study.  He is not a candidate for cardiac MRI due to his size.  He will likely be referred for PYP scan as an outpatient.  Increase metoprolol to 150mg  daily.  Continue torsemide.  # Longstanding persistent atrial fibrillation: He remains in atrial fibrillation. Resume Xarelto.  Rate is better controlled but goes to the 130s with exertion.  Increase metoprolol to 150mg  daily.   # CAD: #  Hyperlipidemia: CTO mid RCA with inferior akinesis on echo.  80-90% ostial/proximal LAD.  He underwent successful ostial LAD PCI on 8/13.  Plan for 1 month of triple therapy then stop aspirin.  Continue clopidogrel and Xarelto for 6 months.  Then he can stop clopidogrel.  He has been intolerant of statins and will be considered for PCSK9 inhibitor as an outpatient.   # Hypertension: Blood pressures above goal.  Increase metoprolol as above. Continue doxazosin and metoprolol. His home benazepril was held 2/2 AKI and to make room for rate control.  He will need to restart an ACE-I as an outpatient if renal function remains stable.   # Acute on chronic kidney disease: Renal function stable on torsemide.  [facility] will sign off.   Medication Recommendations:  Resume ACE-I as an outpatient.  Increase metoprolol today. Other recommendations (labs, testing, etc):  Outpatient sleep study Follow up as an outpatient:  We will arrange in 7-10 days.  OK for discharge from a cardiac standpoint.      For questions or updates, please contact [place] Please consult [email or web address] for contact info under Cardiology/STEMI.      Signed, Skeet Latch, MD  02/06/2018, 10:31 AM

## 2018-02-06 NOTE — Clinical Social Work Note (Signed)
Clinical Social Work Assessment  Patient Details  Name: John Stark MRN: [id number] Date of Birth: [birth year]  Date of referral:  02/06/18               Reason for consult:  Facility Placement, Discharge Planning                Permission sought to share information with:  Family Supports Permission granted to share information::  Yes, Verbal Permission Granted  Name::     [name], Carrington Clamp [place]  Agency::     Relationship::  Wife, daughters  Contact Information:  Mrs. Frieson - [phone]; Lemmie Evens [phone], Darla [phone]  Housing/Transportation Living arrangements for the past 2 months:  [place] of Information:  Patient, Spouse, Adult Children Patient Interpreter Needed:  None Criminal Activity/Legal Involvement Pertinent to Current Situation/Hospitalization:  No - Comment as needed Significant Relationships:  Adult Children, Spouse Lives with:  Spouse Do you feel safe going back to the place where you live?  No(Patient in agreement with ST rehab before returning home) Need for family participation in patient care:  Yes (Comment)  Care giving concerns: Patient and wife live alone and are in agreement that [facility] rehab is  best for patient before he returns home.  Social Worker assessment / plan:  CSW talked with patient, his wife and daughter Darla at the bedside. Mr. Haskew was sitting up in a chair and was alert, oriented and agreeable to talking with CSW. When asked, patient responded that he has been to [facility] [facility] before at [facility] approx. 4 years or so ago. CSW advised by daughter that she lives in [place] and her sister Lemmie Evens lives in [place]. The recommendation of ST rehab by PT discussed, as well as the facility search process, and patient was provided with SNF list for [place] and surrounding counties. CSW advised that their preferences are: (1) [facility], as this is convenient for family to reach, (2) a  SNF in [place], and (3) [employer] or [employer]   Employment status:  Retired Engineer, maintenance (IT)) PT Recommendations:  [place] / Referral to community resources:  Skilled Nursing Facility(Patient provided with SNF list)  Patient/Family's Response to care:  Patient/family expressed no concerns regarding his care during hospitalization.  Patient/Family's Understanding of and Emotional Response to Diagnosis, Current Treatment, and Prognosis:  Patient appeared to have an understanding of his need for ST rehab and was an active participant in the facility placement conversation.  Emotional Assessment Appearance:  Appears stated age Attitude/Demeanor/Rapport:  Engaged Affect (typically observed):  Pleasant, Appropriate Orientation:  Oriented to Self, Oriented to Place, Oriented to  Time, Oriented to Situation Alcohol / Substance use:  Tobacco Use, Alcohol Use, Illicit Drugs(Patient reported that he quit smokiing, and does not drink or use illicit drugs) Psych involvement (Current and /or in the community):  No (Comment)  Discharge Needs  Concerns to be addressed:  Discharge Planning Concerns Readmission within the last 30 days:  No Current discharge risk:  None Barriers to Discharge:  [place], Dillard Pascal [place], [place] 02/06/2018, 3:45 PM

## 2018-02-06 NOTE — Care Management Note (Addendum)
Case Management Note  Patient Details  Name: John Stark MRN: [id number] Date of Birth: October 08, 1935  Subjective/Objective:   Per pt eval rec SNF, plan for dc tomorrwo, CSW aware.                  Action/Plan: DC to SNF 8/15.  Expected Discharge Date:                  Expected Discharge Plan:  [place]  In-House Referral:     Discharge planning Services  CM Consult  Post Acute Care Choice:    Choice offered to:  Patient  DME Arranged:    DME Agency:     HH Arranged:  Patient Refused [place] Agency:     Status of Service:  Completed, signed off  If discussed at [employer] of Stay Meetings, dates discussed:    Additional Comments:  Zenon Mayo, RN 02/06/2018, 3:14 PM

## 2018-02-07 LAB — URINALYSIS, ROUTINE W REFLEX MICROSCOPIC
BACTERIA UA: NONE SEEN
BILIRUBIN URINE: NEGATIVE
Glucose, UA: NEGATIVE mg/dL
Ketones, ur: NEGATIVE mg/dL
Nitrite: NEGATIVE
PH: 5 (ref 5.0–8.0)
PROTEIN: NEGATIVE mg/dL
Specific Gravity, Urine: 1.005 (ref 1.005–1.030)

## 2018-02-07 LAB — CBC WITH DIFFERENTIAL/PLATELET
ABS IMMATURE GRANULOCYTES: 0.1 10*3/uL (ref 0.0–0.1)
Basophils Absolute: 0 10*3/uL (ref 0.0–0.1)
Basophils Relative: 0 %
Eosinophils Absolute: 0.2 10*3/uL (ref 0.0–0.7)
Eosinophils Relative: 1 %
HEMATOCRIT: 38.2 % — AB (ref 39.0–52.0)
Hemoglobin: 12.6 g/dL — ABNORMAL LOW (ref 13.0–17.0)
IMMATURE GRANULOCYTES: 1 %
LYMPHS ABS: 1.2 10*3/uL (ref 0.7–4.0)
Lymphocytes Relative: 9 %
MCH: 30.2 pg (ref 26.0–34.0)
MCHC: 33 g/dL (ref 30.0–36.0)
MCV: 91.6 fL (ref 78.0–100.0)
MONO ABS: 1.5 10*3/uL — AB (ref 0.1–1.0)
MONOS PCT: 11 %
NEUTROS ABS: 10.5 10*3/uL — AB (ref 1.7–7.7)
NEUTROS PCT: 78 %
Platelets: 153 10*3/uL (ref 150–400)
RBC: 4.17 MIL/uL — ABNORMAL LOW (ref 4.22–5.81)
RDW: 15.5 % (ref 11.5–15.5)
WBC: 13.5 10*3/uL — ABNORMAL HIGH (ref 4.0–10.5)

## 2018-02-07 MED ORDER — TAMSULOSIN HCL 0.4 MG PO CAPS
0.4000 mg | ORAL_CAPSULE | Freq: Every day | ORAL | Status: DC
  Administered 2018-02-07 – 2018-02-08 (×2): 0.4 mg via ORAL
  Filled 2018-02-07 (×2): qty 1

## 2018-02-07 MED ORDER — TAMSULOSIN HCL 0.4 MG PO CAPS: 0.4000 mg | ORAL_CAPSULE | Freq: Every day | ORAL | Status: AC

## 2018-02-07 NOTE — Progress Notes (Signed)
CARDIAC REHAB PHASE I   PRE:  Rate/Rhythm: 101 afib    BP: sitting 148/79    SaO2: 93 RA  MODE:  Ambulation: 190 ft   POST:  Rate/Rhythm: 144 afib max at end    BP: sitting 169/99     SaO2: 94 RA  Pt stands well. Fairly steady with RW. HR stable in 100s during first part of walk. However pt began to fatigue at 80 ft. Took rest stops every 30 ft on return to room, stating his whole body and especially knees were tired. HR increased during this to 130s, max 144 once sitting. Gave pt reminders of managing HF at home.  [id number]   [place], ACSM 02/07/2018 10:42 AM

## 2018-02-08 LAB — CBC WITH DIFFERENTIAL/PLATELET
Abs Immature Granulocytes: 0.1 10*3/uL (ref 0.0–0.1)
BASOS ABS: 0 10*3/uL (ref 0.0–0.1)
BASOS PCT: 0 %
EOS PCT: 1 %
Eosinophils Absolute: 0.2 10*3/uL (ref 0.0–0.7)
HCT: 39.2 % (ref 39.0–52.0)
HEMOGLOBIN: 13 g/dL (ref 13.0–17.0)
Immature Granulocytes: 1 %
Lymphocytes Relative: 10 %
Lymphs Abs: 1.3 10*3/uL (ref 0.7–4.0)
MCH: 30.3 pg (ref 26.0–34.0)
MCHC: 33.2 g/dL (ref 30.0–36.0)
MCV: 91.4 fL (ref 78.0–100.0)
MONO ABS: 1.3 10*3/uL — AB (ref 0.1–1.0)
Monocytes Relative: 10 %
Neutro Abs: 10.6 10*3/uL — ABNORMAL HIGH (ref 1.7–7.7)
Neutrophils Relative %: 78 %
PLATELETS: 163 10*3/uL (ref 150–400)
RBC: 4.29 MIL/uL (ref 4.22–5.81)
RDW: 15.3 % (ref 11.5–15.5)
WBC: 13.6 10*3/uL — ABNORMAL HIGH (ref 4.0–10.5)

## 2018-02-08 NOTE — Progress Notes (Signed)
CARDIAC REHAB PHASE I   PRE:  Rate/Rhythm: 84 afib    BP: sitting 127/81    SaO2: 94 RA  MODE:  Ambulation: 70 ft   POST:  Rate/Rhythm: 122 afib    BP: sitting 156/98     SaO2: 93 RA  Pt stood independently and walked with RW and gait belt. C/o his knees being weak which limited his distance. Sts his legs (probably quads) feel strong. Return to recliner, sat a little early due to fatigue and obstacles. He is frustrated by wait for SNF. Sts he has no questions regarding his heart. [phone]  [place], ACSM 02/08/2018 11:23 AM

## 2018-02-08 NOTE — Clinical Social Work Placement (Signed)
   CLINICAL SOCIAL WORK PLACEMENT  NOTE 02/08/18 - DISCHARGED TO [facility] VIA AMBULANCE  Date:  02/08/2018  Patient Details  Name: John Stark MRN: [id number] Date of Birth: 1936/04/18  Clinical Social Work is seeking post-discharge placement for this patient at the [place] level of care (*CSW will initial, date and re-position this form in  chart as items are completed):  Yes   Patient/family provided with [place] Work Department's list of facilities offering this level of care within the geographic area requested by the patient (or if unable, by the patient's family).  Yes   Patient/family informed of their freedom to choose among providers that offer the needed level of care, that participate in Medicare, Medicaid or managed care program needed by the patient, have an available bed and are willing to accept the patient.  Yes   Patient/family informed of [facility]'s ownership interest in [facility] and [facility], as well as of the fact that they are under no obligation to receive care at these facilities.  PASRR submitted to EDS on       PASRR number received on       Existing PASRR number confirmed on 02/06/18     FL2 transmitted to all facilities in geographic area requested by pt/family on 02/06/18     FL2 transmitted to all facilities within larger geographic area on       Patient informed that his/her managed care company has contracts with or will negotiate with certain facilities, including the following:         YES - Patient/family informed of bed offers received.  Patient chooses bed at  [facility], [facility]     Physician recommends and patient chooses bed at      Patient to be transferred to  [facility] on  02/08/18.  Patient to be transferred to facility by  ambulance     Patient family notified on  02/08/18 of transfer.  Name of family member notified:   Patient contacted his wife  John Stark on 8/16 while CSW in the room to advise her that insurance authorization received and he would be discharging to facility this afternoon.     PHYSICIAN       Additional Comment:    _______________________________________________ Sable Feil, LCSW 02/08/2018, 3:30 PM

## 2018-02-08 NOTE — Discharge Summary (Signed)
Physician Discharge Summary  John Stark:[id number] DOB: [birth year] DOA: 01/28/2018 Patient seen and stable since 8/15--no new issues or changes to med list etc PCP: Alroy Dust, L.Marlou Sa, MD  Admit date: 01/28/2018 Discharge date: 02/08/2018  Time spent: 45 minutes  Recommendations for Outpatient Follow-up:  1. Note medication changes metoprolol to metoprolol ER 150 daily 2. Adedd taking aspirin daily, Plavix and Xarelto for 1 month-then de-escalate in 6 months off of Plavix to just Xarelto and this can be guided by cardiology 3. Get labs basic metabolic panel in about 1 week  4. Will need outpatient exercise rehab and potential rehab 5. OP-Patient sleep study be coordinated by PCP 6. Will need cardiac follow-up in 7 to 10 days-may need PYP scan as OP 7. ACE inhibitor held this admission will need resumption of the same stone labs 8. Added flomax this admit ion addition to Cardura for Weak stream--consider adding Mybetriq and d/c cardura if no better in 3-5 d 9. D/c to snf  Discharge Diagnoses:  Principal Problem:   Acute exacerbation of CHF (congestive heart failure) (HCC) Active Problems:   Overweight   Essential hypertension   DIASTOLIC HEART FAILURE, CHRONIC   Atrial fibrillation (HCC)   COPD (chronic obstructive pulmonary disease) (HCC)   Acute on chronic combined systolic and diastolic CHF (congestive heart failure) (HCC)   Elevated troponin   Coronary artery disease involving native coronary artery of native heart without angina pectoris   Discharge Condition: improved  Diet recommendation: hh low salt  Filed Weights   02/06/18 0519 02/07/18 0546 02/08/18 0414  Weight: 130.8 kg 127.7 kg 127.4 kg    History of present illness:  55 m, known hfpef htn hld gerd copd stg ii ckdchr afib chad>6 s/p dccv in the past Admit with decomp CHF-new CM noted on echo Had L + R Cath Eventual DES done See below  Hospital Course:  Acute bimodal CHF-initially diuresed, Lasix held  8/8 secondary to renal insufficiency-cath 8/9 showed PAH RV failure-we will need outpatient sleep study and evaluation-see below-total diuresis for this admission -15 L weight down from 136 to 127 kg Severe Pulm Htn-needs sleep study as cath showed elevated hemodynamics Cardiomyopathy-patient has CTO mid RCA and 80 to 90% stenosis-PCI performed 8/13 loaded with Plavix and going home on Xarelto Plavix aspirin as per above-as cannot tolerate statins will need outpatient referral to lipid clinic and will need outpatient PYP scan as cannot fit on table for cardiac MRI Perm Afib [place] >5-complicated by some SVT-on Xarelto-rates were faster and prior to discharge metoprolol was adjusted as above COPD-stable during hospital stay Acute resp failure-initially presented with acute hypoxic respiratory failure secondary to volume overload and diuresis as above AKI on ckd iii-see above-going home on Demadex needs outpatient reevaluation and labs htn Normocytic anemia Body mass index is 39.17 kg/m.-Life-threatening    Procedures:  Cardiac cath x2  DES on 8/13  Echo   Consultations:  Cardiology  Discharge Exam: Vitals:   02/08/18 0736 02/08/18 1245  BP: (!) 128/111 131/82  Pulse: 95 90  Resp: 12 20  Temp: 98.2 F (36.8 C) 98.8 F (37.1 C)  SpO2: 95%     General: In good spirits sitting up in bed no new issues no JVD no bruit Cardiovascular: S1-S2 no murmur rub or gallop Respiratory: Clinically clear Abdomen soft nontender no rebound no guarding Mild lower extremity edema Neurologically intact Psych euthymic  Discharge Instructions   Discharge Instructions    AMB Referral to Cardiac Rehabilitation - Phase  II   Complete by:  As directed    Diagnosis:   Coronary Stents NSTEMI     Diet - low sodium heart healthy   Complete by:  As directed    Discharge instructions   Complete by:  As directed    You will notice that your medications have changed significantly and you will need  to increase the amount of metoprolol that you take--in addition you will be on aspirin, Plavix and then Xarelto for at least 1 month--- after 1 month discontinue aspirin and subsequently talk to your cardiologist about what medications you should be on I would recommend you continue the Demadex at the dose that you have been on You will need labs in about 1 week If needed we will get home health to see you otherwise you will need to go to get rehab at a facility   Increase activity slowly   Complete by:  As directed      Allergies as of 02/08/2018      Reactions   Crestor [rosuvastatin Calcium] Other (See Comments)   Muscle aches    Pravastatin Other (See Comments)   Muscle aches    Simvastatin Other (See Comments)   Muscle aches       Medication List    STOP taking these medications   benazepril 10 MG tablet Commonly known as:  LOTENSIN   celecoxib 200 MG capsule Commonly known as:  CELEBREX   furosemide 40 MG tablet Commonly known as:  LASIX   metoprolol tartrate 50 MG tablet Commonly known as:  LOPRESSOR     TAKE these medications   amLODipine 5 MG tablet Commonly known as:  NORVASC Take 5 mg by mouth daily. Notes to patient:  Lowers blood pressure Decreases chest pain    aspirin 81 MG chewable tablet Chew 1 tablet (81 mg total) by mouth daily. Notes to patient:  Prevents clotting in stent and heart attack   CARDURA 4 MG tablet Generic drug:  doxazosin Take 4 mg by mouth at bedtime.   clopidogrel 75 MG tablet Commonly known as:  PLAVIX Take 1 tablet (75 mg total) by mouth daily. Notes to patient:  Prevents clotting in stent and heart attack   cyclobenzaprine 5 MG tablet Commonly known as:  FLEXERIL Take 1 tablet (5 mg total) by mouth at bedtime and may repeat dose one time if needed. Notes to patient:  Muscle relaxant to help sleep    famotidine 20 MG tablet Commonly known as:  PEPCID Take 1 tablet (20 mg total) by mouth daily. Notes to patient:  Treats  acid reflux Prevents heartburn   Fluticasone-Salmeterol 250-50 MCG/DOSE Aepb Commonly known as:  ADVAIR Inhale 1 puff into the lungs 2 (two) times daily. Notes to patient:  Prevents wheezing and shortness of breath   JUICE PLUS FIBRE PO Take 6 tablets by mouth daily. 2 nuts tablets, 2 fruit tablets, and 2 veggie tablets   meclizine 25 MG tablet Commonly known as:  ANTIVERT Take 25 mg by mouth 3 (three) times daily as needed for dizziness. Notes to patient:  As needed for dizziness   metoprolol succinate 50 MG 24 hr tablet Commonly known as:  TOPROL-XL Take 3 tablets (150 mg total) by mouth daily after supper. Take with or immediately following a meal. Notes to patient:  Decreases work of the heart  Lowers blood pressure and heart rate    potassium chloride 10 MEQ CR capsule Commonly known as:  MICRO-K Take 10 mEq  by mouth daily. Notes to patient:  Potassium replacement    PROAIR HFA 108 (90 Base) MCG/ACT inhaler Generic drug:  albuterol Inhale 2 puffs into the lungs every 4 (four) hours as needed for shortness of breath or wheezing. Notes to patient:  To treat wheezing and shortness of breath as needed    PROSCAR 5 MG tablet Generic drug:  finasteride Take 5 mg by mouth at bedtime.   tamsulosin 0.4 MG Caps capsule Commonly known as:  FLOMAX Take 1 capsule (0.4 mg total) by mouth daily after supper.   torsemide 20 MG tablet Commonly known as:  DEMADEX Take 2 tablets (40 mg total) by mouth daily.   vitamin B-12 100 MCG tablet Commonly known as:  CYANOCOBALAMIN Take 100 mcg by mouth daily.   WELCHOL 625 MG tablet Generic drug:  colesevelam Take 1,250 mg by mouth 2 (two) times daily with a meal.   XARELTO 20 MG Tabs tablet Generic drug:  rivaroxaban TAKE 1 TABLET BY MOUTH ONCE DAILY      Allergies  Allergen Reactions  . Crestor [Rosuvastatin Calcium] Other (See Comments)    Muscle aches   . Pravastatin Other (See Comments)    Muscle aches   . Simvastatin  Other (See Comments)    Muscle aches    Follow-up Information    Imogene Burn, PA-C Follow up.   Specialty:  Cardiology Why:  Cardiology hospital follow up on August 28th at 11:00. Please arrive 15 minutes early for check in.  Contact information: [address] [place] [address] [phone]            The results of significant diagnostics from this hospitalization (including imaging, microbiology, ancillary and laboratory) are listed below for reference.    Significant Diagnostic Studies: Dg Chest 2 View  Result Date: 01/28/2018 CLINICAL DATA:  Shortness of breath.  History of COPD. EXAM: CHEST - 2 VIEW COMPARISON:  Chest x-ray dated 05/21/2012 and abdominal CT scan dated 12/20/2017 FINDINGS: There is new cardiomegaly with small bilateral pleural effusions. There is slight new pulmonary vascular prominence. Bilateral calcified granulomas. Tortuosity and calcification of the thoracic aorta. Neurostimulator wires in the midthoracic spine. No significant bone abnormality. Moderate hiatal hernia IMPRESSION: Findings consistent with congestive heart failure with new cardiomegaly. Pulmonary vascular congestion, and small pleural effusions. No discrete pulmonary edema. Electronically Signed   By: Lorriane Shire M.D.   On: 01/28/2018 16:33   Ct Head Wo Contrast  Result Date: 01/30/2018 CLINICAL DATA:  Brief LEFT visual field cut 6 weeks ago. Evaluate amaurosis fugax. History of carotid artery disease, hypertension, hyperlipidemia. EXAM: CT HEAD WITHOUT CONTRAST TECHNIQUE: Contiguous axial images were obtained from the base of the skull through the vertex without intravenous contrast. COMPARISON:  None. FINDINGS: BRAIN: No intraparenchymal hemorrhage, mass effect nor midline shift. Moderate to severe global parenchymal brain volume loss, moderate lobulated ventricular margin with mild sulcal effacement at the convexities. No hydrocephalus. Patchy supratentorial white matter  hypodensities within normal range for patient's age, though non-specific are most compatible with chronic small vessel ischemic disease. No acute large vascular territory infarcts. No abnormal extra-axial fluid collections. Basal cisterns are patent. VASCULAR: Mild calcific atherosclerosis of the carotid siphons. SKULL: No skull fracture. No significant scalp soft tissue swelling. SINUSES/ORBITS: Trace paranasal sinus mucosal thickening. Subcentimeter frontoethmoidal osteomas. Mastoid air cells are well aerated.The included ocular globes and orbital contents are non-suspicious. Status post bilateral ocular lens implants. OTHER: None. IMPRESSION: 1. No acute intracranial process. 2. Moderate  very to severe parenchymal brain volume loss with suspected component of normal pressure hydrocephalus. 3. Moderate chronic small vessel ischemic changes. Electronically Signed   By: Elon Alas M.D.   On: 01/30/2018 16:54    Microbiology: No results found for this or any previous visit (from the past 240 hour(s)).   Labs: Basic Metabolic Panel: Recent Labs  Lab 02/02/18 0613 02/03/18 0535 02/04/18 0339 02/05/18 0451 02/06/18 0331  NA 141 140 138 139 136  K 4.6 4.3 4.4 4.5 4.4  CL 107 106 99 101 100  CO2 26 26 30 29 28   GLUCOSE 90 115* 101* 106* 100*  BUN 33* 34* 37* 31* 34*  CREATININE 1.44* 1.39* 1.52* 1.38* 1.39*  CALCIUM 8.4* 8.2* 8.9 8.6* 8.7*   Liver Function Tests: Recent Labs  Lab 02/06/18 0331  AST 18  ALT 35  ALKPHOS 43  BILITOT 1.2  PROT 5.6*  ALBUMIN 2.6*   No results for input(s): LIPASE, AMYLASE in the last 168 hours. No results for input(s): AMMONIA in the last 168 hours. CBC: Recent Labs  Lab 02/04/18 0339 02/05/18 0451 02/06/18 0331 02/07/18 0401 02/08/18 0452  WBC 12.4* 12.6* 12.0* 13.5* 13.6*  NEUTROABS 10.1* 10.1* 9.4* 10.5* 10.6*  HGB 12.4* 11.8* 12.0* 12.6* 13.0  HCT 38.8* 37.1* 36.2* 38.2* 39.2  MCV 93.7 94.2 92.3 91.6 91.4  PLT 160 151 151 153 163    Cardiac Enzymes: No results for input(s): CKTOTAL, CKMB, CKMBINDEX, TROPONINI in the last 168 hours. BNP: BNP (last 3 results) Recent Labs    01/28/18 2039  BNP 1,523.4*    ProBNP (last 3 results) No results for input(s): PROBNP in the last 8760 hours.  CBG: No results for input(s): GLUCAP in the last 168 hours.     Signed:  Nita Sells MD   [facility] 02/08/2018, 3:07 PM

## 2018-02-08 NOTE — Progress Notes (Signed)
Called report to Ferguson at [facility].  Transport services called transport patient.

## 2018-02-08 NOTE — Progress Notes (Signed)
Physical Therapy Treatment Patient Details Name: John Stark MRN: [id number] DOB: 20-Oct-1935 Today's Date: 02/08/2018    History of Present Illness Pt is an 82 y.o. male admitted 01/28/18 with CHF and SOB. Cath 8/9 showed severe 2 vessel CAD; deemed not a CABG candidate. Underwent successful ostial LAD PCI on 8/13. PMH includes CHF, HTN, atrial fibrillation   PT Comments    Pt progressing with mobility. Remains limited by generalized weakness, fatigue, and decreased activity tolerance. Today's session focused on BLE therex and ambulation. Will continue to follow acutely.   Follow Up Recommendations  SNF;Supervision for mobility/OOB     Equipment Recommendations  None recommended by PT    Recommendations for Other Services       Precautions / Restrictions Precautions Precautions: Fall    Mobility  Bed Mobility               General bed mobility comments: Received sitting in recliner  Transfers Overall transfer level: Needs assistance Equipment used: Rolling walker (2 wheeled) Transfers: Sit to/from Stand Sit to Stand: Min guard         General transfer comment: Performed 8x sit<>stand with min guard; initially able to with single UE support, requiring bilat UE support with fatigue  Ambulation/Gait Ambulation/Gait assistance: Min guard Gait Distance (Feet): (hallway) Assistive device: Rolling walker (2 wheeled) Gait Pattern/deviations: Step-through pattern;Decreased stride length;Trunk flexed Gait velocity: Decreased Gait velocity interpretation: <1.8 ft/sec, indicate of risk for recurrent falls General Gait Details: Slow amb with RW and min guard for balance. DOE 2/4, required 2x standing rest break due to c/o BLE fatigue    Stairs             Wheelchair Mobility    Modified Rankin (Stroke Patients Only)       Balance Overall balance assessment: Needs assistance Sitting-balance support: Feet supported Sitting balance-Leahy Scale: Good        Standing balance-Leahy Scale: Poor Standing balance comment: reliance on UE support                            Cognition Arousal/Alertness: Awake/alert Behavior During Therapy: WFL for tasks assessed/performed Overall Cognitive Status: Within Functional Limits for tasks assessed                                        Exercises General Exercises - Lower Extremity Long Arc Quad: AROM;15 reps;Seated;Both(w/ 5-sec hold) Hip Flexion/Marching: AROM;15 reps;Both;Seated    General Comments        Pertinent Vitals/Pain Pain Assessment: No/denies pain    Home Living                      Prior Function            PT Goals (current goals can now be found in the care plan section) Acute Rehab PT Goals Patient Stated Goal: Agreeable to rehab prior to returning home; states ultimate goal is to "lose a lot of weight" PT Goal Formulation: With patient Time For Goal Achievement: 02/13/18 Potential to Achieve Goals: Good Progress towards PT goals: Progressing toward goals    Frequency    Min 3X/week      PT Plan Current plan remains appropriate    Co-evaluation              AM-PAC PT "6 Clicks" Daily Activity  Outcome Measure  Difficulty turning over in bed (including adjusting bedclothes, sheets and blankets)?: Unable Difficulty moving from lying on back to sitting on the side of the bed? : Unable Difficulty sitting down on and standing up from a chair with arms (e.g., wheelchair, bedside commode, etc,.)?: Unable Help needed moving to and from a bed to chair (including a wheelchair)?: A Little Help needed walking in hospital room?: A Little Help needed climbing 3-5 steps with a railing? : A Little 6 Click Score: 12    End of Session Equipment Utilized During Treatment: Gait belt Activity Tolerance: Patient tolerated treatment well Patient left: in chair;with call bell/phone within reach Nurse Communication: Mobility  status PT Visit Diagnosis: Other abnormalities of gait and mobility (R26.89);Muscle weakness (generalized) (M62.81)     Time: 1452-1510 PT Time Calculation (min) (ACUTE ONLY): 18 min  Charges:  $Therapeutic Exercise: 8-22 mins                     Mabeline Caras, PT, DPT Acute Rehab Services  Pager: [place] 02/08/2018, 5:03 PM

## 2018-02-08 NOTE — Progress Notes (Signed)
No changes-d/c to SNF when ready

## 2018-02-12 ENCOUNTER — Encounter: Payer: Medicare Other | Admitting: Vascular Surgery

## 2018-02-19 ENCOUNTER — Encounter: Payer: Self-pay | Admitting: Physician Assistant

## 2018-02-20 ENCOUNTER — Ambulatory Visit: Payer: Medicare Other | Admitting: Physician Assistant

## 2018-03-06 ENCOUNTER — Other Ambulatory Visit: Payer: Self-pay

## 2018-03-06 ENCOUNTER — Observation Stay (HOSPITAL_COMMUNITY)
Admission: EM | Admit: 2018-03-06 | Discharge: 2018-03-09 | Disposition: A | Discharge status: Home / Self Care | Payer: Medicare Other | Attending: Internal Medicine | Admitting: Internal Medicine

## 2018-03-06 ENCOUNTER — Encounter (HOSPITAL_COMMUNITY): Payer: Self-pay | Admitting: Emergency Medicine

## 2018-03-06 DIAGNOSIS — Z96651 Presence of right artificial knee joint: Secondary | ICD-10-CM | POA: Diagnosis not present

## 2018-03-06 DIAGNOSIS — I5043 Acute on chronic combined systolic (congestive) and diastolic (congestive) heart failure: Secondary | ICD-10-CM | POA: Insufficient documentation

## 2018-03-06 DIAGNOSIS — J449 Chronic obstructive pulmonary disease, unspecified: Secondary | ICD-10-CM | POA: Diagnosis not present

## 2018-03-06 DIAGNOSIS — Z7901 Long term (current) use of anticoagulants: Secondary | ICD-10-CM | POA: Insufficient documentation

## 2018-03-06 DIAGNOSIS — Z8551 Personal history of malignant neoplasm of bladder: Secondary | ICD-10-CM | POA: Insufficient documentation

## 2018-03-06 DIAGNOSIS — Z7982 Long term (current) use of aspirin: Secondary | ICD-10-CM | POA: Insufficient documentation

## 2018-03-06 DIAGNOSIS — N179 Acute kidney failure, unspecified: Secondary | ICD-10-CM | POA: Diagnosis not present

## 2018-03-06 DIAGNOSIS — Z9221 Personal history of antineoplastic chemotherapy: Secondary | ICD-10-CM | POA: Diagnosis not present

## 2018-03-06 DIAGNOSIS — Z7902 Long term (current) use of antithrombotics/antiplatelets: Secondary | ICD-10-CM | POA: Diagnosis not present

## 2018-03-06 DIAGNOSIS — I480 Paroxysmal atrial fibrillation: Secondary | ICD-10-CM

## 2018-03-06 DIAGNOSIS — G629 Polyneuropathy, unspecified: Secondary | ICD-10-CM | POA: Insufficient documentation

## 2018-03-06 DIAGNOSIS — Z87891 Personal history of nicotine dependence: Secondary | ICD-10-CM | POA: Diagnosis not present

## 2018-03-06 DIAGNOSIS — E785 Hyperlipidemia, unspecified: Secondary | ICD-10-CM | POA: Diagnosis present

## 2018-03-06 DIAGNOSIS — N289 Disorder of kidney and ureter, unspecified: Secondary | ICD-10-CM

## 2018-03-06 DIAGNOSIS — I5042 Chronic combined systolic (congestive) and diastolic (congestive) heart failure: Secondary | ICD-10-CM | POA: Diagnosis not present

## 2018-03-06 DIAGNOSIS — R748 Abnormal levels of other serum enzymes: Secondary | ICD-10-CM

## 2018-03-06 DIAGNOSIS — I2510 Atherosclerotic heart disease of native coronary artery without angina pectoris: Secondary | ICD-10-CM | POA: Insufficient documentation

## 2018-03-06 DIAGNOSIS — Z79899 Other long term (current) drug therapy: Secondary | ICD-10-CM | POA: Insufficient documentation

## 2018-03-06 DIAGNOSIS — I10 Essential (primary) hypertension: Secondary | ICD-10-CM | POA: Diagnosis present

## 2018-03-06 DIAGNOSIS — R339 Retention of urine, unspecified: Secondary | ICD-10-CM | POA: Diagnosis present

## 2018-03-06 DIAGNOSIS — I4891 Unspecified atrial fibrillation: Secondary | ICD-10-CM | POA: Diagnosis present

## 2018-03-06 DIAGNOSIS — Z9189 Other specified personal risk factors, not elsewhere classified: Secondary | ICD-10-CM

## 2018-03-06 DIAGNOSIS — M6282 Rhabdomyolysis: Secondary | ICD-10-CM | POA: Diagnosis present

## 2018-03-06 DIAGNOSIS — N183 Chronic kidney disease, stage 3 (moderate): Secondary | ICD-10-CM | POA: Diagnosis not present

## 2018-03-06 DIAGNOSIS — I482 Chronic atrial fibrillation: Secondary | ICD-10-CM | POA: Diagnosis not present

## 2018-03-06 DIAGNOSIS — N401 Enlarged prostate with lower urinary tract symptoms: Secondary | ICD-10-CM | POA: Diagnosis not present

## 2018-03-06 DIAGNOSIS — I714 Abdominal aortic aneurysm, without rupture: Secondary | ICD-10-CM | POA: Insufficient documentation

## 2018-03-06 DIAGNOSIS — E860 Dehydration: Secondary | ICD-10-CM | POA: Insufficient documentation

## 2018-03-06 DIAGNOSIS — I130 Hypertensive heart and chronic kidney disease with heart failure and stage 1 through stage 4 chronic kidney disease, or unspecified chronic kidney disease: Secondary | ICD-10-CM | POA: Insufficient documentation

## 2018-03-06 LAB — BASIC METABOLIC PANEL
Anion gap: 12 (ref 5–15)
BUN: 39 mg/dL — ABNORMAL HIGH (ref 8–23)
CO2: 24 mmol/L (ref 22–32)
Calcium: 9 mg/dL (ref 8.9–10.3)
Chloride: 100 mmol/L (ref 98–111)
Creatinine, Ser: 2.36 mg/dL — ABNORMAL HIGH (ref 0.61–1.24)
GFR calc Af Amer: 28 mL/min — ABNORMAL LOW (ref >60)
GFR calc non Af Amer: 24 mL/min — ABNORMAL LOW (ref >60)
Glucose, Bld: 104 mg/dL — ABNORMAL HIGH (ref 70–99)
Potassium: 3.3 mmol/L — ABNORMAL LOW (ref 3.5–5.1)
Sodium: 136 mmol/L (ref 135–145)

## 2018-03-06 LAB — CBC WITH DIFFERENTIAL/PLATELET
Abs Immature Granulocytes: 0.1 10*3/uL (ref 0.0–0.1)
Basophils Absolute: 0.1 10*3/uL (ref 0.0–0.1)
Basophils Relative: 1 %
Eosinophils Absolute: 0 10*3/uL (ref 0.0–0.7)
Eosinophils Relative: 0 %
HCT: 38.6 % — ABNORMAL LOW (ref 39.0–52.0)
Hemoglobin: 12.2 g/dL — ABNORMAL LOW (ref 13.0–17.0)
Immature Granulocytes: 1 %
Lymphocytes Relative: 10 %
Lymphs Abs: 1 10*3/uL (ref 0.7–4.0)
MCH: 29.9 pg (ref 26.0–34.0)
MCHC: 31.6 g/dL (ref 30.0–36.0)
MCV: 94.6 fL (ref 78.0–100.0)
Monocytes Absolute: 1.3 10*3/uL — ABNORMAL HIGH (ref 0.1–1.0)
Monocytes Relative: 13 %
Neutro Abs: 7.6 10*3/uL (ref 1.7–7.7)
Neutrophils Relative %: 75 %
Platelets: 194 10*3/uL (ref 150–400)
RBC: 4.08 MIL/uL — ABNORMAL LOW (ref 4.22–5.81)
RDW: 15 % (ref 11.5–15.5)
WBC: 10.2 10*3/uL (ref 4.0–10.5)

## 2018-03-06 LAB — CK: Total CK: 1206 U/L — ABNORMAL HIGH (ref 49–397)

## 2018-03-06 MED ORDER — MOMETASONE FURO-FORMOTEROL FUM 200-5 MCG/ACT IN AERO
2.0000 | INHALATION_SPRAY | Freq: Two times a day (BID) | RESPIRATORY_TRACT | Status: DC
  Administered 2018-03-06 – 2018-03-09 (×6): 2 via RESPIRATORY_TRACT
  Filled 2018-03-06: qty 8.8

## 2018-03-06 MED ORDER — LACTATED RINGERS IV SOLN
INTRAVENOUS | Status: DC
  Administered 2018-03-06 – 2018-03-08 (×3): via INTRAVENOUS

## 2018-03-06 MED ORDER — IPRATROPIUM-ALBUTEROL 0.5-2.5 (3) MG/3ML IN SOLN
3.0000 mL | Freq: Four times a day (QID) | RESPIRATORY_TRACT | Status: DC
  Administered 2018-03-06: 3 mL via RESPIRATORY_TRACT
  Filled 2018-03-06: qty 3

## 2018-03-06 MED ORDER — ASPIRIN 81 MG PO CHEW: 81.0000 mg | CHEWABLE_TABLET | Freq: Every day | ORAL | Status: DC

## 2018-03-06 MED ORDER — ACETAMINOPHEN 650 MG RE SUPP: 650.0000 mg | Freq: Four times a day (QID) | RECTAL | Status: DC | PRN

## 2018-03-06 MED ORDER — ACETAMINOPHEN 325 MG PO TABS
650.0000 mg | ORAL_TABLET | Freq: Four times a day (QID) | ORAL | Status: DC | PRN
  Administered 2018-03-06: 650 mg via ORAL
  Filled 2018-03-06: qty 2

## 2018-03-06 MED ORDER — FINASTERIDE 5 MG PO TABS
5.0000 mg | ORAL_TABLET | Freq: Every day | ORAL | Status: DC
  Administered 2018-03-06 – 2018-03-08 (×3): 5 mg via ORAL
  Filled 2018-03-06 (×3): qty 1

## 2018-03-06 MED ORDER — CLOPIDOGREL BISULFATE 75 MG PO TABS
75.0000 mg | ORAL_TABLET | Freq: Every evening | ORAL | Status: DC
  Administered 2018-03-06 – 2018-03-08 (×3): 75 mg via ORAL
  Filled 2018-03-06 (×3): qty 1

## 2018-03-06 MED ORDER — TAMSULOSIN HCL 0.4 MG PO CAPS
0.4000 mg | ORAL_CAPSULE | Freq: Every day | ORAL | Status: DC
  Administered 2018-03-06 – 2018-03-08 (×3): 0.4 mg via ORAL
  Filled 2018-03-06 (×3): qty 1

## 2018-03-06 MED ORDER — FAMOTIDINE 20 MG PO TABS
20.0000 mg | ORAL_TABLET | Freq: Every day | ORAL | Status: DC
  Administered 2018-03-07 – 2018-03-09 (×3): 20 mg via ORAL
  Filled 2018-03-06 (×3): qty 1

## 2018-03-06 MED ORDER — AMLODIPINE BESYLATE 5 MG PO TABS
5.0000 mg | ORAL_TABLET | Freq: Every day | ORAL | Status: DC
  Administered 2018-03-07: 5 mg via ORAL
  Filled 2018-03-06: qty 1

## 2018-03-06 MED ORDER — IPRATROPIUM-ALBUTEROL 0.5-2.5 (3) MG/3ML IN SOLN: 3.0000 mL | Freq: Four times a day (QID) | RESPIRATORY_TRACT | Status: DC | PRN

## 2018-03-06 MED ORDER — RIVAROXABAN 20 MG PO TABS
20.0000 mg | ORAL_TABLET | Freq: Every day | ORAL | Status: DC
  Administered 2018-03-06: 20 mg via ORAL
  Filled 2018-03-06: qty 1

## 2018-03-06 MED ORDER — ALBUTEROL SULFATE (2.5 MG/3ML) 0.083% IN NEBU
2.5000 mg | INHALATION_SOLUTION | RESPIRATORY_TRACT | Status: DC | PRN
Start: 2018-03-06 — End: 2018-03-09

## 2018-03-06 MED ORDER — ONDANSETRON HCL 4 MG PO TABS: 4.0000 mg | ORAL_TABLET | Freq: Four times a day (QID) | ORAL | Status: DC | PRN

## 2018-03-06 MED ORDER — ONDANSETRON HCL 4 MG/2ML IJ SOLN: 4.0000 mg | Freq: Four times a day (QID) | INTRAMUSCULAR | Status: DC | PRN

## 2018-03-06 MED ORDER — DOCUSATE SODIUM 100 MG PO CAPS
100.0000 mg | ORAL_CAPSULE | Freq: Two times a day (BID) | ORAL | Status: DC
  Administered 2018-03-06 – 2018-03-09 (×6): 100 mg via ORAL
  Filled 2018-03-06 (×6): qty 1

## 2018-03-06 MED ORDER — METOPROLOL SUCCINATE ER 50 MG PO TB24
150.0000 mg | ORAL_TABLET | Freq: Every day | ORAL | Status: DC
  Administered 2018-03-06: 150 mg via ORAL
  Filled 2018-03-06: qty 1

## 2018-03-06 MED ORDER — SODIUM CHLORIDE 0.9 % IV BOLUS
500.0000 mL | Freq: Once | INTRAVENOUS | Status: AC
  Administered 2018-03-06: 500 mL via INTRAVENOUS

## 2018-03-06 MED ORDER — DOXAZOSIN MESYLATE 4 MG PO TABS
4.0000 mg | ORAL_TABLET | Freq: Every day | ORAL | Status: DC
  Administered 2018-03-06 – 2018-03-08 (×3): 4 mg via ORAL
  Filled 2018-03-06 (×2): qty 2
  Filled 2018-03-06 (×3): qty 1
  Filled 2018-03-06: qty 2

## 2018-03-06 NOTE — Evaluation (Signed)
Physical Therapy Evaluation Patient Details Name: RICHARD RITCHEY MRN: [id number] DOB: May 04, 1936 Today's Date: 03/06/2018   History of Present Illness  Pt is an 82 y/o male admitted after falling at home and was unable to get up. Pt found to have ARF and mild rhabdomyolisis. PMH includes COPD, CHF, a fib, HTN, AAA, and back surgery.   Clinical Impression  Pt admitted secondary to problem above with deficits below. Mobility limited to chair secondary to reports of burning sensation in bilateral knees. Pt requiring mod A for mobility using RW. Pt reports wife has been unable to physically assist as needed at home and pt with falls at home. Feel pt is at increased risk for falls and will need increased assist at d/c. Will continue to follow acutely to maximize functional mobility independence and safety.     Follow Up Recommendations SNF;Supervision/Assistance - 24 hour    Equipment Recommendations  None recommended by PT    Recommendations for Other Services OT consult     Precautions / Restrictions Precautions Precautions: Fall Restrictions Weight Bearing Restrictions: No      Mobility  Bed Mobility Overal bed mobility: Needs Assistance Bed Mobility: Supine to Sit     Supine to sit: Min assist     General bed mobility comments: Min A for trunk elevation. Increased time and effort required to perform.   Transfers Overall transfer level: Needs assistance Equipment used: Rolling walker (2 wheeled) Transfers: Sit to/from [facility] Sit to Stand: Mod assist Stand pivot transfers: Mod assist       General transfer comment: Mod A for lift assist and steadying. Verbal cues for safe hand placement. Mod A for steadying during stand pivot to chair. Pt reporting increased burning in knees so further mobility limited.   Ambulation/Gait                Stairs            Wheelchair Mobility    Modified Rankin (Stroke Patients Only)        Balance Overall balance assessment: Needs assistance Sitting-balance support: No upper extremity supported;Feet supported Sitting balance-Leahy Scale: Good     Standing balance support: Bilateral upper extremity supported;During functional activity Standing balance-Leahy Scale: Poor Standing balance comment: Reliant on BUE support                              Pertinent Vitals/Pain Pain Assessment: Faces Faces Pain Scale: Hurts whole lot Pain Location: Bilat knee s Pain Descriptors / Indicators: Burning Pain Intervention(s): Limited activity within patient's tolerance;Monitored during session;Repositioned    Home Living Family/patient expects to be discharged to:: Skilled nursing facility Living Arrangements: Spouse/significant other Available Help at Discharge: Family Type of Home: House Home Access: Stairs to enter   Technical brewer of Steps: 2 Home Layout: Two level Home Equipment: Walker - 2 wheels      Prior Function Level of Independence: Needs assistance   Gait / Transfers Assistance Needed: Reports he needed assist to go up the steps and to get in and out of bed. Pt reports wife with difficulty assisting pt.   ADL's / Homemaking Assistance Needed: Required assist for ADLs.         Hand Dominance   Dominant Hand: Right    Extremity/Trunk Assessment   Upper Extremity Assessment Upper Extremity Assessment: Defer to OT evaluation    Lower Extremity Assessment Lower Extremity Assessment: Generalized weakness  Cervical / Trunk Assessment Cervical / Trunk Assessment: Kyphotic  Communication   Communication: HOH  Cognition Arousal/Alertness: Awake/alert Behavior During Therapy: WFL for tasks assessed/performed Overall Cognitive Status: Within Functional Limits for tasks assessed                                        General Comments      Exercises     Assessment/Plan    PT Assessment Patient needs continued  PT services  PT Problem List Decreased strength;Decreased balance;Decreased activity tolerance;Decreased mobility;Decreased knowledge of precautions;Pain       PT Treatment Interventions DME instruction;Gait training;Functional mobility training;Therapeutic activities;Therapeutic exercise;Stair training;Balance training;Patient/family education    PT Goals (Current goals can be found in the Care Plan section)  Acute Rehab PT Goals Patient Stated Goal: to be able to walk better  PT Goal Formulation: With patient Time For Goal Achievement: 03/20/18 Potential to Achieve Goals: Good    Frequency Min 3X/week   Barriers to discharge        Co-evaluation               AM-PAC PT "6 Clicks" Daily Activity  Outcome Measure Difficulty turning over in bed (including adjusting bedclothes, sheets and blankets)?: A Little Difficulty moving from lying on back to sitting on the side of the bed? : Unable Difficulty sitting down on and standing up from a chair with arms (e.g., wheelchair, bedside commode, etc,.)?: Unable Help needed moving to and from a bed to chair (including a wheelchair)?: A Lot Help needed walking in hospital room?: A Lot Help needed climbing 3-5 steps with a railing? : Total 6 Click Score: 10    End of Session Equipment Utilized During Treatment: Gait belt Activity Tolerance: Patient limited by pain Patient left: in chair;with call bell/phone within reach;with chair alarm set;with nursing/sitter in room Nurse Communication: Mobility status PT Visit Diagnosis: Unsteadiness on feet (R26.81);History of falling (Z91.81);Muscle weakness (generalized) (M62.81);Repeated falls (R29.6);Pain Pain - Right/Left: (bilateral ) Pain - part of body: Knee    Time: 9935-7017 PT Time Calculation (min) (ACUTE ONLY): 17 min   Charges:   PT Evaluation $PT Eval Moderate Complexity: 1 Mod          Leighton Ruff, PT, DPT  Acute Rehabilitation Services  Pager: [phone] Office: [phone]   Rudean Hitt 03/06/2018, 6:13 PM

## 2018-03-06 NOTE — ED Notes (Signed)
Pt given urinal and instructed that a urine sample is needed, pt states he is unable to go at this time.

## 2018-03-06 NOTE — ED Provider Notes (Signed)
[facility] EMERGENCY DEPARTMENT Provider Note   CSN: [id number] Arrival date & time: 03/06/18  1127     History   Chief Complaint No chief complaint on file.   HPI John Stark is a 82 y.o. male.  HPI Patient presents to the emergency department with bilateral leg issues over the last several years that he states led to his fall last night.  The patient states he fell on the floor landing on his knees last night.  He states that his legs are likely gave out.  Patient states that he was able to crawl to the bedroom last night but his wife was unable to help get him into the bed.  Patient states the leg and slipped on the floor last night.  Patient states that he feels generally weak.  The patient states he was recently discharged from nursing facility this past Sunday.  The patient denies chest pain, shortness of breath, headache,blurred vision, neck pain, fever, cough,  numbness, dizziness, anorexia, edema, abdominal pain, nausea, vomiting, diarrhea, rash, back pain, dysuria, hematemesis, bloody stool, near syncope, or syncope. Past Medical History:  Diagnosis Date  . AAA (abdominal aortic aneurysm) (HCC)    a. infrarenal abdominal aortic aneurysm by CT 11/2017 4.7 x 3.9 cm versus 4.4 x 4 1 cm  . Asthma    "as a child"  . Bladder cancer (HCC)   . Carotid artery disease (HCC)    a. carotid duplex 2019 with minimal plaque, no significant stenosis.  . CKD (chronic kidney disease), stage III (HCC)   . COPD (chronic obstructive pulmonary disease) (HCC)   . Edema    FEET/LEGS  . Enlarged prostate   . GERD (gastroesophageal reflux disease)   . Hearing loss in right ear   . Hiatal hernia    By CT  . HLD (hyperlipidemia)   . HTN (hypertension)   . Neuropathy   . Osteoarthrosis, unspecified whether generalized or localized, unspecified site   . Overweight(278.02)   . Permanent atrial fibrillation (HCC)    a. Xarelto started 05/22/12.  . Right knee pain    awaiting knee replacement    Patient Active Problem List   Diagnosis Date Noted  . Coronary artery disease involving native coronary artery of native heart without angina pectoris   . Acute on chronic combined systolic and diastolic CHF (congestive heart failure) (HCC)   . Elevated troponin   . COPD (chronic obstructive pulmonary disease) (HCC) 01/29/2018  . Acute exacerbation of CHF (congestive heart failure) (HCC) 01/28/2018  . Osteoarthritis of right knee 08/07/2012  . Atrial fibrillation (HCC) 06/05/2012  . DIASTOLIC HEART FAILURE, CHRONIC 06/15/2009  . HYPERLIPIDEMIA-MIXED 12/04/2008  . Overweight 12/04/2008  . Essential hypertension 12/04/2008  . GERD 12/04/2008  . OSTEOARTHRITIS 12/04/2008  . PALPITATIONS 12/04/2008    Past Surgical History:  Procedure Laterality Date  . BACK SURGERY     20 11  . CARDIOVERSION  06/18/2012   Procedure: CARDIOVERSION;  Surgeon: Josue Hector, MD;  Location: [facility];  Service: Cardiovascular;  Laterality: N/A;  . CATARACT EXTRACTION W/PHACO Right 09/07/2016   Procedure: CATARACT EXTRACTION PHACO AND INTRAOCULAR LENS PLACEMENT ([place]);  Surgeon: Eulogio Bear, MD;  Location: [facility];  Service: Ophthalmology;  Laterality: Right;  [place] 42.9 AP% 10.0 CDE 4.52 Fluid pack lot # [id number]  . CATARACT EXTRACTION W/PHACO Left 10/12/2016   Procedure: CATARACT EXTRACTION PHACO AND INTRAOCULAR LENS PLACEMENT ([facility]);  Surgeon: Eulogio Bear, MD;  Location: [facility];  Service:  Ophthalmology;  Laterality: Left;  [place] 34.2 AP% 9.0 CDE 3.17 Fluid pack lot # [id number]  . COLONOSCOPY    . CORONARY STENT INTERVENTION N/A 02/05/2018   Procedure: CORONARY STENT INTERVENTION;  Surgeon: Jettie Booze, MD;  Location: [facility];  Service: Cardiovascular;  Laterality: N/A;  . JOINT REPLACEMENT    . RIGHT/LEFT HEART CATH AND CORONARY ANGIOGRAPHY N/A 02/01/2018   Procedure: RIGHT/LEFT HEART CATH AND CORONARY ANGIOGRAPHY;  Surgeon: Nelva Bush,  MD;  Location: [facility];  Service: Cardiovascular;  Laterality: N/A;  . SPINAL CORD STIMULATOR INSERTION N/A 02/18/2016   Procedure: LUMBAR SPINAL CORD STIMULATOR INSERTION;  Surgeon: Clydell Hakim, MD;  Location: [facility];  Service: Neurosurgery;  Laterality: N/A;  LUMBAR SPINAL CORD STIMULATOR INSERTION  . TOTAL KNEE ARTHROPLASTY Right 08/07/2012   Procedure: TOTAL KNEE ARTHROPLASTY;  Surgeon: Kerin Salen, MD;  Location: [facility];  Service: Orthopedics;  Laterality: Right;  . TRANSURETHRAL RESECTION OF BLADDER TUMOR N/A 05/04/2017   Procedure: TRANSURETHRAL RESECTION OF BLADDER TUMOR (TURBT)/ INTRAVESICAL CHEMOTHERAPY INSTILLATION;  Surgeon: Kathie Rhodes, MD;  Location: [facility];  Service: Urology;  Laterality: N/A;        Home Medications    Prior to Admission medications   Medication Sig Start Date End Date Taking? Authorizing Provider  amLODipine (NORVASC) 5 MG tablet Take 5 mg by mouth daily.     [provider]  aspirin 81 MG chewable tablet Chew 1 tablet (81 mg total) by mouth daily. 02/07/18   Nita Sells, MD  clopidogrel (PLAVIX) 75 MG tablet Take 1 tablet (75 mg total) by mouth daily. 02/06/18   Nita Sells, MD  cyclobenzaprine (FLEXERIL) 5 MG tablet Take 1 tablet (5 mg total) by mouth at bedtime and may repeat dose one time if needed. Patient not taking: Reported on 01/28/2018 12/20/17   Recardo Evangelist, PA-C  doxazosin (CARDURA) 4 MG tablet Take 4 mg by mouth at bedtime.     [provider]  famotidine (PEPCID) 20 MG tablet Take 1 tablet (20 mg total) by mouth daily. 08/03/17   Burnell Blanks, MD  finasteride (PROSCAR) 5 MG tablet Take 5 mg by mouth at bedtime.     [provider]  Fluticasone-Salmeterol (ADVAIR) 250-50 MCG/DOSE AEPB Inhale 1 puff into the lungs 2 (two) times daily.    [provider]  meclizine (ANTIVERT) 25 MG tablet Take 25 mg by mouth 3 (three) times daily as needed for dizziness.     [provider]  metoprolol succinate (TOPROL-XL) 50 MG 24 hr tablet Take 3 tablets (150 mg total) by mouth daily after supper. Take with or immediately following a meal. 02/06/18   Nita Sells, MD  Nutritional Supplements (JUICE PLUS FIBRE PO) Take 6 tablets by mouth daily. 2 nuts tablets, 2 fruit tablets, and 2 veggie tablets    [provider]  potassium chloride (MICRO-K) 10 MEQ CR capsule Take 10 mEq by mouth daily.     [provider]  PROAIR HFA 108 [phone] Base) MCG/ACT inhaler Inhale 2 puffs into the lungs every 4 (four) hours as needed for shortness of breath or wheezing.  04/20/16   [provider]  tamsulosin (FLOMAX) 0.4 MG CAPS capsule Take 1 capsule (0.4 mg total) by mouth daily after supper. 02/07/18   Nita Sells, MD  torsemide (DEMADEX) 20 MG tablet Take 2 tablets (40 mg total) by mouth daily. 02/07/18   Nita Sells, MD  vitamin B-12 (CYANOCOBALAMIN) 100 MCG  tablet Take 100 mcg by mouth daily.    [provider]  [facility] 625 MG tablet Take 1,250 mg by mouth 2 (two) times daily with a meal.  07/16/13   [provider]  XARELTO 20 MG TABS tablet TAKE 1 TABLET BY MOUTH ONCE DAILY 07/09/17   Burnell Blanks, MD    Family History Family History  Problem Relation Age of Onset  . Cancer Other   . Heart attack Brother 36       had been on O2 for 10 years before this - COPD/tobacco    Social History Social History   Tobacco Use  . Smoking status: Former Smoker    Packs/day: 1.00    Years: 20.00    Pack years: 20.00    Types: Cigarettes    Last attempt to quit: 05/22/1977    Years since quitting: 40.8  . Smokeless tobacco: Never Used  . Tobacco comment: Quit 1978  Substance Use Topics  . Alcohol use: No  . Drug use: No     Allergies   Crestor [rosuvastatin calcium]; Pravastatin; and Simvastatin   Review of Systems Review of Systems All other systems negative except as documented in the  HPI. All pertinent positives and negatives as reviewed in the HPI.  Physical Exam Updated Vital Signs BP 115/70   Pulse 93   Temp 98.1 F (36.7 C) (Oral)   Resp 16   Ht 5\' 11"  (1.803 m)   Wt 117.9 kg   SpO2 96%   BMI 36.26 kg/m   Physical Exam  Constitutional: He is oriented to person, place, and time. He appears well-developed and well-nourished. No distress.  HENT:  Head: Normocephalic and atraumatic.  Mouth/Throat: Oropharynx is clear and moist.  Eyes: Pupils are equal, round, and reactive to light.  Neck: Normal range of motion. Neck supple.  Cardiovascular: Normal rate, regular rhythm and normal heart sounds. Exam reveals no gallop and no friction rub.  No murmur heard. Pulmonary/Chest: Effort normal and breath sounds normal. No respiratory distress. He has no wheezes.  Abdominal: Soft. Bowel sounds are normal. He exhibits no distension. There is no tenderness.  Neurological: He is alert and oriented to person, place, and time. He exhibits normal muscle tone. Coordination normal.  Skin: Skin is warm and dry. Capillary refill takes less than 2 seconds. No rash noted. No erythema.  Psychiatric: He has a normal mood and affect. His behavior is normal.  Nursing note and vitals reviewed.    ED Treatments / Results  Labs (all labs ordered are listed, but only abnormal results are displayed) Labs Reviewed  BASIC METABOLIC PANEL - Abnormal; Notable for the following components:      Result Value   Potassium 3.3 (*)    Glucose, Bld 104 (*)    BUN 39 (*)    Creatinine, Ser 2.36 (*)    GFR calc non Af Amer 24 (*)    GFR calc Af Amer 28 (*)    All other components within normal limits  CBC WITH DIFFERENTIAL/PLATELET - Abnormal; Notable for the following components:   RBC 4.08 (*)    Hemoglobin 12.2 (*)    HCT 38.6 (*)    Monocytes Absolute 1.3 (*)    All other components within normal limits  CK - Abnormal; Notable for the following components:   Total CK 1,206 (*)     All other components within normal limits  URINALYSIS, ROUTINE W REFLEX MICROSCOPIC    EKG None  Radiology  No results found.  Procedures Procedures (including critical care time)  Medications Ordered in ED Medications  sodium chloride 0.9 % bolus 500 mL (has no administration in time range)     Initial Impression / Assessment and Plan / ED Course  I have reviewed the triage vital signs and the nursing notes.  Pertinent labs & imaging results that were available during my care of the patient were reviewed by me and considered in my medical decision making (see chart for details).    Patient is most likely needing admission to make sure that his CK clears and his renal function does improve somewhat.  Patient's weakness has been an ongoing issue over the last several years.  The patient states he has had trouble with his legs.   Final Clinical Impressions(s) / ED Diagnoses   Final diagnoses:  Elevated CK  Renal impairment    ED Discharge Orders    None       Dalia Heading, PA-C 03/06/18 1516    Fredia Sorrow, MD 03/06/18 1759

## 2018-03-06 NOTE — ED Triage Notes (Signed)
Pt began experiencing bilat leg weakness last night and slipped to the floor around 1900 and layed on the floor until he was able to crawl for help this morning around 0900. Pt complaining of generalized weakness and is normally able to ambulate with a walker. Pt was just d/c'd from nursing facility Sunday.

## 2018-03-06 NOTE — H&P (Signed)
History and Physical    John Stark IRS:[id number] DOB: 08-29-1935 DOA: 03/06/2018  PCP: Alroy Dust, L.Marlou Sa, MD Consultants:  Angelena Form - cardiology; [place] - urology; [facility] - pain center Patient coming from:  Home - lives with wife; NOK: Wife, [phone]  Chief Complaint: B leg weakness  HPI: John Stark is a 82 y.o. male with medical history significant of afib on Xarelto; HTN; HLD; COPD; stage 3 CKD; bladder cancer; and AAA presenting with B leg weakness.  He has an ongoing problem with joint pain - hips, knees, ankles.  He has had a few isolated incidents where his legs collapse on him without any pain and he falls to the ground.  He had one of these episodes last night at suppertime; "I was just totally useless, I could hardly lift my head off the floor."  It wouldn't improve.  He was able to get upstairs but couldn't get into bed and so he slept in the floor.  He crawled to the bathroom this AM (on his belly) and then he called 911.    He was recently discharged from [facility] following admission from 8/5-16 for acute CHF exacerbation; he also had a stent placed.  His discharge from there was about 10 days ago.  His wife has been helping him up the stairs every night, sometimes with rest breaks, since returning from rehab.  His wife was not satisfied with his dismissal from rehab - he couldn't get in the house from the car and she has had to provide significant support.  "And that's all he's done since he left rehab is to sit and I help him to get upstairs one time a day.  It was every night for those 10 nights.  So no, he didn't come home with total functional abilities."  His wife is thinking he might need to be placed somewhere else.  ED Course:  Chronic knee and hip pain.  Occasionally uses walker/cane.  Last night, knees gave out.  He crawled into the bedroom.  His wife couldn't get him into bed so he slept on the floor.  They called EMS about noon.  He is now with  acute renal failure, mildly elevated CK.    Review of Systems: As per HPI; otherwise review of systems reviewed and negative.   Ambulatory Status:  Ambulates with a walker only since returning from rehab, and minimally at that  Past Medical History:  Diagnosis Date  . AAA (abdominal aortic aneurysm) ([place])    a. infrarenal abdominal aortic aneurysm by CT 11/2017 4.7 x 3.9 cm versus 4.4 x 4 1 cm  . Asthma    "as a child"  . Bladder cancer ([place])   . Carotid artery disease ([place])    a. carotid duplex 2019 with minimal plaque, no significant stenosis.  . CKD (chronic kidney disease), stage III ([place])   . COPD (chronic obstructive pulmonary disease) ([place])   . Edema    FEET/LEGS  . Enlarged prostate   . GERD (gastroesophageal reflux disease)   . Hearing loss in right ear   . Hiatal hernia    By CT  . HLD (hyperlipidemia)   . HTN (hypertension)   . Neuropathy   . Osteoarthrosis, unspecified whether generalized or localized, unspecified site   . Overweight(278.02)   . Permanent atrial fibrillation ([place])    a. Xarelto started 05/22/12.  . Right knee pain    awaiting knee replacement    Past Surgical History:  Procedure Laterality  Date  . BACK SURGERY     2011  . CARDIOVERSION  06/18/2012   Procedure: CARDIOVERSION;  Surgeon: Josue Hector, MD;  Location: [facility] ENDOSCOPY;  Service: Cardiovascular;  Laterality: N/A;  . CATARACT EXTRACTION W/PHACO Right 09/07/2016   Procedure: CATARACT EXTRACTION PHACO AND INTRAOCULAR LENS PLACEMENT ([place]);  Surgeon: Eulogio Bear, MD;  Location: [facility] ORS;  Service: Ophthalmology;  Laterality: Right;  [place] 42.9 AP% 10.0 CDE 4.52 Fluid pack lot # [id number]  . CATARACT EXTRACTION W/PHACO Left 10/12/2016   Procedure: CATARACT EXTRACTION PHACO AND INTRAOCULAR LENS PLACEMENT (I[facility]);  Surgeon: Eulogio Bear, MD;  Location: [facility] ORS;  Service: Ophthalmology;  Laterality: Left;  [place] 34.2 AP% 9.0 CDE 3.17 Fluid pack lot # [id number]  . COLONOSCOPY    .  CORONARY STENT INTERVENTION N/A 02/05/2018   Procedure: CORONARY STENT INTERVENTION;  Surgeon: Jettie Booze, MD;  Location: [facility] CV LAB;  Service: Cardiovascular;  Laterality: N/A;  . JOINT REPLACEMENT    . RIGHT/LEFT HEART CATH AND CORONARY ANGIOGRAPHY N/A 02/01/2018   Procedure: RIGHT/LEFT HEART CATH AND CORONARY ANGIOGRAPHY;  Surgeon: Nelva Bush, MD;  Location: [place] CV LAB;  Service: Cardiovascular;  Laterality: N/A;  . SPINAL CORD STIMULATOR INSERTION N/A 02/18/2016   Procedure: LUMBAR SPINAL CORD STIMULATOR INSERTION;  Surgeon: Clydell Hakim, MD;  Location: [place] NEURO ORS;  Service: Neurosurgery;  Laterality: N/A;  LUMBAR SPINAL CORD STIMULATOR INSERTION  . TOTAL KNEE ARTHROPLASTY Right 08/07/2012   Procedure: TOTAL KNEE ARTHROPLASTY;  Surgeon: Kerin Salen, MD;  Location: [place];  Service: Orthopedics;  Laterality: Right;  . TRANSURETHRAL RESECTION OF BLADDER TUMOR N/A 05/04/2017   Procedure: TRANSURETHRAL RESECTION OF BLADDER TUMOR (TURBT)/ INTRAVESICAL CHEMOTHERAPY INSTILLATION;  Surgeon: Kathie Rhodes, MD;  Location: [facility] ORS;  Service: Urology;  Laterality: N/A;    Social History   Socioeconomic History  . Marital status: Married    Spouse name: Not on file  . Number of children: 2  . Years of education: Not on file  . Highest education level: Not on file  Occupational History  . Occupation: Probation officer [employer]  Social Needs  . Financial resource strain: Not on file  . Food insecurity:    Worry: Not on file    Inability: Not on file  . Transportation needs:    Medical: Not on file    Non-medical: Not on file  Tobacco Use  . Smoking status: Former Smoker    Packs/day: 1.00    Years: 20.00    Pack years: 20.00    Types: Cigarettes    Last attempt to quit: 05/22/1977    Years since quitting: 40.8  . Smokeless tobacco: Never Used  . Tobacco comment: Quit 1978  Substance and Sexual Activity  . Alcohol use: No  . Drug use: No  . Sexual  activity: Not on file  Lifestyle  . Physical activity:    Days per week: Not on file    Minutes per session: Not on file  . Stress: Not on file  Relationships  . Social connections:    Talks on phone: Not on file    Gets together: Not on file    Attends religious service: Not on file    Active member of club or organization: Not on file    Attends meetings of clubs or organizations: Not on file    Relationship status: Not on file  . Intimate partner violence:    Fear of current or ex partner: Not on file  Emotionally abused: Not on file    Physically abused: Not on file    Forced sexual activity: Not on file  Other Topics Concern  . Not on file  Social History Narrative   Retired. Married. Regularly exercises.     Allergies  Allergen Reactions  . Crestor [Rosuvastatin Calcium] Other (See Comments)    Muscle aches   . Pravastatin Other (See Comments)    Muscle aches   . Simvastatin Other (See Comments)    Muscle aches     Family History  Problem Relation Age of Onset  . Cancer Other   . Heart attack Brother 61       had been on O2 for 10 years before this - COPD/tobacco    Prior to Admission medications   Medication Sig Start Date End Date Taking? Authorizing Provider  amLODipine (NORVASC) 5 MG tablet Take 5 mg by mouth daily.     [provider]  aspirin 81 MG chewable tablet Chew 1 tablet (81 mg total) by mouth daily. 02/07/18   Nita Sells, MD  clopidogrel (PLAVIX) 75 MG tablet Take 1 tablet (75 mg total) by mouth daily. 02/06/18   Nita Sells, MD  cyclobenzaprine (FLEXERIL) 5 MG tablet Take 1 tablet (5 mg total) by mouth at bedtime and may repeat dose one time if needed. Patient not taking: Reported on 01/28/2018 12/20/17   Recardo Evangelist, PA-C  doxazosin (CARDURA) 4 MG tablet Take 4 mg by mouth at bedtime.     [provider]  famotidine (PEPCID) 20 MG tablet Take 1 tablet (20 mg total) by mouth daily. 08/03/17   Burnell Blanks, MD  finasteride (PROSCAR) 5 MG tablet Take 5 mg by mouth at bedtime.     [provider]  Fluticasone-Salmeterol (ADVAIR) 250-50 MCG/DOSE AEPB Inhale 1 puff into the lungs 2 (two) times daily.    [provider]  meclizine (ANTIVERT) 25 MG tablet Take 25 mg by mouth 3 (three) times daily as needed for dizziness.    [provider]  metoprolol succinate (TOPROL-XL) 50 MG 24 hr tablet Take 3 tablets (150 mg total) by mouth daily after supper. Take with or immediately following a meal. 02/06/18   Nita Sells, MD  Nutritional Supplements (JUICE PLUS FIBRE PO) Take 6 tablets by mouth daily. 2 nuts tablets, 2 fruit tablets, and 2 veggie tablets    [provider]  potassium chloride (MICRO-K) 10 MEQ CR capsule Take 10 mEq by mouth daily.     [provider]  PROAIR HFA 108 [phone] Base) MCG/ACT inhaler Inhale 2 puffs into the lungs every 4 (four) hours as needed for shortness of breath or wheezing.  04/20/16   [provider]  tamsulosin (FLOMAX) 0.4 MG CAPS capsule Take 1 capsule (0.4 mg total) by mouth daily after supper. 02/07/18   Nita Sells, MD  torsemide (DEMADEX) 20 MG tablet Take 2 tablets (40 mg total) by mouth daily. 02/07/18   Nita Sells, MD  vitamin B-12 (CYANOCOBALAMIN) 100 MCG tablet Take 100 mcg by mouth daily.    [provider]  [facility] 625 MG tablet Take 1,250 mg by mouth 2 (two) times daily with a meal.  07/16/13   [provider]  XARELTO 20 MG TABS tablet TAKE 1 TABLET BY MOUTH ONCE DAILY 07/09/17   Burnell Blanks, MD    Physical Exam: Vitals:   03/06/18 1515 03/06/18 1545 03/06/18 1600 03/06/18 1615  BP: (!) 116/93 122/67 118/77 110/66  Pulse: 92 90 96 (!) 106  Resp:  20  14  Temp:      TempSrc:      SpO2: 97% 96% 94% 93%  Weight:      Height:         General:  Appears calm and comfortable and is NAD; he appears quite sedentary Eyes:  PERRL, EOMI, normal  lids, iris ENT:  grossly normal hearing, lips & tongue, mmm; appropriate dentition Neck:  no LAD, masses or thyromegaly Cardiovascular:  RRR, no m/r/g. No LE edema.  Respiratory:   CTA bilaterally with no wheezes/rales/rhonchi.  Normal respiratory effort. Abdomen:  soft, NT, ND, NABS Skin:  no rash or induration seen on limited exam Musculoskeletal:  grossly normal tone BUE/BLE, good ROM, no bony abnormality Psychiatric:  grossly normal mood and affect, speech fluent and appropriate, AOx3 Neurologic:  CN 2-12 grossly intact, moves all extremities in coordinated fashion, sensation intact    Radiological Exams on Admission: No results found.  EKG: Independently reviewed.  NSR with rate 84; nonspecific ST changes with no evidence of acute ischemia   Labs on Admission: I have personally reviewed the available labs and imaging studies at the time of the admission.  Pertinent labs:   K+ 3.3 Glucose 104 BUN 39/Creatinine 2.36/GFR 24; 34/1.39/46 CK 1206 WBC 10.2 Hgb 12.2  Assessment/Plan Principal Problem:   Acute renal failure (ARF) (HCC) Active Problems:   Dyslipidemia   Essential hypertension   Chronic combined systolic and diastolic CHF (congestive heart failure) (HCC)   Atrial fibrillation (HCC)   COPD (chronic obstructive pulmonary disease) (HCC)   Rhabdomyolysis   Sedentary lifestyle   Urinary retention   Acute renal failure  -Baseline creatinine is 1.39.   -Today's creatinine is 2.36  -Likely due to prerenal failure secondary to dehydration and continuation of ARB, diruetics. -Hold diuretics for now -Gentle IVF hydration -Follow up renal function by BMP -Avoid ACEI and NSAIDs  Mild rhabdo -This is most likely related to acute renal failure and spending all night in the floor -However, he is taking Welchol which can increase the CK level -Statins and Welchol can also contribute to myalgias and could theoretically be contributing to his immobility -This was  discussed with Dr. Irish Lack, who recommends holding lipid lowering therapies at this time -Will hydrate overnight (gently) and recheck CK level in the AM  Chronic CHF/CAD -Recent stent on 8/13 -Patient discussed with Dr. Irish Lack and he says that it is reasonable to stop ASA at this time to decrease his overall bleeding risk -Will continue Plavix (5 additional months) and Xarelto -Patient is without angina at this time -8/6 echo with EF 25-05%, diastolic flattening -CHF appears to be compensated; will need to be cautious with use of IVF -Hold Demadex while hydrating  Afib on Xarelto -Rate controlled with Cardura -Continue Xarelto - although he is close to the cutoff due to his current creatinine clearance  HTN -Continue Norvasc; Cardura; Toprol XL -Hold Lotensin  Urinary retention -Patient required an indwelling foley during his time in rehab and for several days after discharge -It was discontinued about a week ago -He has not voided yet today -If he is found to have recurrent retention and is unable to void, another foley will need to be placed. -Continue Cardura, Proscar, Flomax  HLD -As noted above, his Welchol may have contributed to his elevated CK -He has previously been unable to tolerate statins -Lipid lowering therapy may be contributing to myalgias -Hold lipid-lowering therapy for now  COPD -Continue Advair (formulary substitution) -prn Albuterol -Will add standing Duonebs, but consider Spiriva as an outpatient  Sedentary lifestyle -His wife reports that he spent the majority of his time in rehab sitting in a wheelchair -Since being home, he continues to be very sedentary -Strongly suggest that his legs were unable to support his weight due to obesity/deconditioning/lack of activity -He appears very likely to need placement -PT/SW consults requested   DVT prophylaxis: Xarelto Code Status:  Full - confirmed with patient/family Family Communication: Wife  present throughout evaluation  Disposition Plan:  Likely to need SNF once clinically improved Consults called: Cardiology by telephone only; SW, PT  Admission status: It is my clinical opinion that referral for OBSERVATION is reasonable and necessary in this patient based on the above information provided. The aforementioned taken together are felt to place the patient at high risk for further clinical deterioration. However it is anticipated that the patient may be medically stable for discharge from the hospital within 24 to 48 hours.    Karmen Bongo MD [facility]  If note is complete, please contact covering daytime or nighttime physician. [email or web address] Password [facility] [address]  03/06/2018, 4:37 PM

## 2018-03-07 DIAGNOSIS — I480 Paroxysmal atrial fibrillation: Secondary | ICD-10-CM | POA: Diagnosis not present

## 2018-03-07 DIAGNOSIS — N289 Disorder of kidney and ureter, unspecified: Secondary | ICD-10-CM

## 2018-03-07 DIAGNOSIS — I5042 Chronic combined systolic (congestive) and diastolic (congestive) heart failure: Secondary | ICD-10-CM | POA: Diagnosis not present

## 2018-03-07 DIAGNOSIS — N179 Acute kidney failure, unspecified: Secondary | ICD-10-CM | POA: Diagnosis not present

## 2018-03-07 DIAGNOSIS — R748 Abnormal levels of other serum enzymes: Secondary | ICD-10-CM

## 2018-03-07 LAB — BASIC METABOLIC PANEL
Anion gap: 12 (ref 5–15)
BUN: 41 mg/dL — AB (ref 8–23)
CO2: 22 mmol/L (ref 22–32)
CREATININE: 2.25 mg/dL — AB (ref 0.61–1.24)
Calcium: 8.3 mg/dL — ABNORMAL LOW (ref 8.9–10.3)
Chloride: 102 mmol/L (ref 98–111)
GFR calc Af Amer: 30 mL/min — ABNORMAL LOW (ref >60)
GFR, EST NON AFRICAN AMERICAN: 26 mL/min — AB (ref >60)
GLUCOSE: 99 mg/dL (ref 70–99)
POTASSIUM: 3.1 mmol/L — AB (ref 3.5–5.1)
SODIUM: 136 mmol/L (ref 135–145)

## 2018-03-07 LAB — CBC
HEMATOCRIT: 34.6 % — AB (ref 39.0–52.0)
Hemoglobin: 11.6 g/dL — ABNORMAL LOW (ref 13.0–17.0)
MCH: 30.5 pg (ref 26.0–34.0)
MCHC: 33.5 g/dL (ref 30.0–36.0)
MCV: 91.1 fL (ref 78.0–100.0)
Platelets: 183 10*3/uL (ref 150–400)
RBC: 3.8 MIL/uL — ABNORMAL LOW (ref 4.22–5.81)
RDW: 15.1 % (ref 11.5–15.5)
WBC: 10.3 10*3/uL (ref 4.0–10.5)

## 2018-03-07 LAB — CK: Total CK: 1234 U/L — ABNORMAL HIGH (ref 49–397)

## 2018-03-07 LAB — GLUCOSE, CAPILLARY: Glucose-Capillary: 127 mg/dL — ABNORMAL HIGH (ref 70–99)

## 2018-03-07 MED ORDER — POTASSIUM CHLORIDE CRYS ER 20 MEQ PO TBCR
40.0000 meq | EXTENDED_RELEASE_TABLET | Freq: Two times a day (BID) | ORAL | Status: AC
  Administered 2018-03-07 (×2): 40 meq via ORAL
  Filled 2018-03-07 (×2): qty 2

## 2018-03-07 MED ORDER — RIVAROXABAN 15 MG PO TABS
15.0000 mg | ORAL_TABLET | Freq: Every day | ORAL | Status: DC
  Administered 2018-03-07 – 2018-03-08 (×2): 15 mg via ORAL
  Filled 2018-03-07 (×2): qty 1

## 2018-03-07 NOTE — Evaluation (Signed)
Occupational Therapy Evaluation Patient Details Name: John Stark MRN: [id number] DOB: [birth year] Today's Date: 03/07/2018    History of Present Illness Pt is an 82 y/o male admitted after falling at home and was unable to get up. Pt found to have ARF and mild rhabdomyolisis. PMH includes COPD, CHF, a fib, HTN, AAA, and back surgery.    Clinical Impression   Pt was admitted as above currently demonstrating deficits in his ability to perform functional mobility and transfers related to ADL's and self care tasks (See OT Problem list below). He is currently overall Mod-Max A LB ADL's/toileting and set-up-Min A UB ADL's. He should benefit from acute OT to assist in maximizing independence with ADL's for anticipated return home with PRN family assist.    Follow Up Recommendations  SNF;Supervision/Assistance - 24 hour    Equipment Recommendations  Other (comment)(Defer to next venue)    Recommendations for Other Services       Precautions / Restrictions Precautions Precautions: Fall Restrictions Weight Bearing Restrictions: No      Mobility Bed Mobility Overal bed mobility: Needs Assistance Bed Mobility: Supine to Sit     Supine to sit: Min assist     General bed mobility comments: Min A for trunk elevation. Increased time and effort required to perform.   Transfers Overall transfer level: Needs assistance Equipment used: Rolling walker (2 wheeled) Transfers: Sit to/from [facility] Sit to Stand: Mod assist Stand pivot transfers: Mod assist;+2 physical assistance;+2 safety/equipment       General transfer comment: Mod A for lift assist and steadying. Verbal cues for safe hand placement. Mod A for steadying during stand pivot to chair. Pt reporting increased burning in knees so further mobility limited.     Balance Overall balance assessment: Needs assistance Sitting-balance support: No upper extremity supported;Feet supported Sitting balance-Leahy  Scale: Good     Standing balance support: Bilateral upper extremity supported;During functional activity Standing balance-Leahy Scale: Poor Standing balance comment: Reliant on BUE support; fear of falling and h/o falls                           ADL either performed or assessed with clinical judgement   ADL Overall ADL's : Needs assistance/impaired Eating/Feeding: Set up;Sitting   Grooming: Wash/dry hands;Wash/dry face;Oral care;Set up;Bed level   Upper Body Bathing: Set up;Bed level   Lower Body Bathing: Moderate assistance;Bed level   Upper Body Dressing : Sitting;Min guard   Lower Body Dressing: +2 for physical assistance;Sit to/from stand;Moderate assistance   Toilet Transfer: Moderate assistance;+2 for physical assistance;Stand-pivot;BSC   Toileting- Clothing Manipulation and Hygiene: +2 for physical assistance;Sit to/from stand;Maximal assistance       Functional mobility during ADLs: Moderate assistance;+2 for physical assistance;+2 for safety/equipment General ADL Comments: Pt was seen for OT assessment followed by ADL retraiing session to include grooming, simulated toilet transfer, bed mobility, bathing and dressing at bed level and sitting EOB in preparation for increased participation in ADL's. Pt currently requires Mod A +2 for SPT from EOB to 3:1 and chair and Mod- max A LB ADL's. Pt is agreeable to SNF Rehab as he is currently requiring increased assistance.     Vision Patient Visual Report: No change from baseline Vision Assessment?: No apparent visual deficits     Perception     Praxis      Pertinent Vitals/Pain Pain Assessment: No/denies pain Faces Pain Scale: No hurt     Hand Dominance Right  Extremity/Trunk Assessment Upper Extremity Assessment Upper Extremity Assessment: Generalized weakness(Overall 3/5 bilaterally)   Lower Extremity Assessment Lower Extremity Assessment: Defer to PT evaluation   Cervical / Trunk  Assessment Cervical / Trunk Assessment: Kyphotic   Communication Communication Communication: HOH   Cognition Arousal/Alertness: Awake/alert Behavior During Therapy: WFL for tasks assessed/performed Overall Cognitive Status: No family/caregiver present to determine baseline cognitive functioning                                 General Comments: Pt states "Where am I?" thinking he was at a SNF Rehab initially. Then asked "Well what hospital am I at then?" Otherwise appeard appropriate for answers/question.   General Comments       Exercises     Shoulder Instructions      Home Living Family/patient expects to be discharged to:: Skilled nursing facility Living Arrangements: Spouse/significant other Available Help at Discharge: Family Type of Home: House Home Access: Stairs to enter [employer] of Steps: 2 Entrance Stairs-Rails: Left Home Layout: Two level;1/2 bath on main level Alternate Level Stairs-Number of Steps: 12             Home Equipment: Walker - 2 wheels;Shower seat;Grab bars - tub/shower;Adaptive equipment;Hand held shower head;Cane - quad;Walker - 4 wheels Adaptive Equipment: Reacher;Long-handled shoe horn;Long-handled sponge Additional Comments: Pt plans to d/c to SNF after acute stay      Prior Functioning/Environment Level of Independence: Needs assistance  Gait / Transfers Assistance Needed: Reports he needed assist to go up the steps and to get in and out of bed. Pt reports wife with difficulty assisting pt. Pt reports that he uses [facility] for household mobility, rollator when out. ADL's / Homemaking Assistance Needed: Required assist for ADLs.             OT Problem List: Decreased strength;Decreased knowledge of use of DME or AE;Pain;Impaired balance (sitting and/or standing);Decreased activity tolerance      OT Treatment/Interventions: Self-care/ADL training;DME and/or AE instruction;Therapeutic activities;Patient/family  education;Therapeutic exercise;Balance training(Balance training for/during ADL's)    OT Goals(Current goals can be found in the care plan section) Acute Rehab OT Goals Patient Stated Goal: Be able to walk better OT Goal Formulation: With patient Time For Goal Achievement: 03/21/18 Potential to Achieve Goals: Fair  OT Frequency: Min 2X/week   Barriers to D/C:    Pt with bed/bath on second floor and currently unable to ambulate safely w/ h/o falls       Co-evaluation              AM-PAC PT "6 Clicks" Daily Activity     Outcome Measure Help from another person eating meals?: None Help from another person taking care of personal grooming?: A Little Help from another person toileting, which includes using toliet, bedpan, or urinal?: A Lot Help from another person bathing (including washing, rinsing, drying)?: A Lot Help from another person to put on and taking off regular upper body clothing?: A Little Help from another person to put on and taking off regular lower body clothing?: A Lot 6 Click Score: 16   End of Session Equipment Utilized During Treatment: Gait belt;Rolling walker Nurse Communication: Mobility status  Activity Tolerance: Patient tolerated treatment well Patient left: in chair;with call bell/phone within reach;with chair alarm set  OT Visit Diagnosis: Unsteadiness on feet (R26.81);Muscle weakness (generalized) (M62.81);History of falling (Z91.81);Pain Pain - Right/Left: (Bilateral LE's) Pain - part of body: Leg;Knee  Time: 9244-6286 OT Time Calculation (min): 48 min Charges:  OT General Charges $OT Visit: 1 Visit OT Evaluation $OT Eval Moderate Complexity: 1 Mod OT Treatments $Self Care/Home Management : 23-37 mins   Loyalty Arentz Beth Dixon, OTR/L 03/07/2018, 10:27am

## 2018-03-07 NOTE — Progress Notes (Signed)
Physical Therapy Treatment Patient Details Name: John Stark MRN: [id number] DOB: 11-25-1935 Today's Date: 03/07/2018    History of Present Illness Pt is an 82 y/o male admitted after falling at home and was unable to get up. Pt found to have ARF and mild rhabdomyolisis. PMH includes COPD, CHF, a fib, HTN, AAA, and back surgery.     PT Comments    Patient with slow progress towards physical therapy goals. Increased ambulation distance in room to 20 feet using walker and min assist (chair follow utilized). Continues with bilateral knee pain with burning sensation, knee instability, gross weakness/debility, and poor endurance. SNF remains appropriate recommendation to maximize functional independence and decrease caregiver burden.    Follow Up Recommendations  SNF;Supervision/Assistance - 24 hour     Equipment Recommendations  None recommended by PT    Recommendations for Other Services OT consult     Precautions / Restrictions Precautions Precautions: Fall Restrictions Weight Bearing Restrictions: No    Mobility  Bed Mobility               General bed mobility comments: OOB in chair  Transfers Overall transfer level: Needs assistance Equipment used: Rolling walker (2 wheeled) Transfers: Sit to/from [facility] Sit to Stand: Mod assist         General transfer comment: Mod assist to boost up to standing.   Ambulation/Gait Ambulation/Gait assistance: Min assist;+2 safety/equipment Gait Distance (Feet): 20 Feet Assistive device: Rolling walker (2 wheeled) Gait Pattern/deviations: Step-through pattern;Decreased stride length;Trunk flexed Gait velocity: decreased Gait velocity interpretation: <1.31 ft/sec, indicative of household ambulator General Gait Details: patient with slow, unsteady gait with knee instability. verbal cues for walker proximity. chair follow utilized   Marine scientist [place]  (Stroke Patients Only)       Balance Overall balance assessment: Needs assistance Sitting-balance support: No upper extremity supported;Feet supported Sitting balance-Leahy Scale: Good     Standing balance support: Bilateral upper extremity supported;During functional activity Standing balance-Leahy Scale: Poor Standing balance comment: Reliant on BUE support                             Cognition Arousal/Alertness: Awake/alert Behavior During Therapy: WFL for tasks assessed/performed Overall Cognitive Status: Within Functional Limits for tasks assessed                                        Exercises      General Comments        Pertinent Vitals/Pain Pain Assessment: Faces Faces Pain Scale: Hurts even more Pain Location: bilateral knees with weightbearing Pain Descriptors / Indicators: Burning Pain Intervention(s): Monitored during session    Home Living                      Prior Function            PT Goals (current goals can now be found in the care plan section) Acute Rehab PT Goals Patient Stated Goal: to be able to walk better  PT Goal Formulation: With patient Time For Goal Achievement: 03/20/18 Potential to Achieve Goals: Good Progress towards PT goals: Progressing toward goals    Frequency    Min 3X/week      PT Plan Current plan remains  appropriate    Co-evaluation              AM-PAC PT "6 Clicks" Daily Activity  Outcome Measure  Difficulty turning over in bed (including adjusting bedclothes, sheets and blankets)?: A Little Difficulty moving from lying on back to sitting on the side of the bed? : Unable Difficulty sitting down on and standing up from a chair with arms (e.g., wheelchair, bedside commode, etc,.)?: Unable Help needed moving to and from a bed to chair (including a wheelchair)?: A Lot Help needed walking in hospital room?: A Lot Help needed climbing 3-5 steps with a railing? :  Total 6 Click Score: 10    End of Session Equipment Utilized During Treatment: Gait belt Activity Tolerance: Patient tolerated treatment well Patient left: in chair;with call bell/phone within reach;with chair alarm set;with family/visitor present Nurse Communication: Mobility status PT Visit Diagnosis: Unsteadiness on feet (R26.81);History of falling (Z91.81);Muscle weakness (generalized) (M62.81);Repeated falls (R29.6);Pain Pain - Right/Left: (bilateral ) Pain - part of body: Knee     Time: 8979-1504 PT Time Calculation (min) (ACUTE ONLY): 16 min  Charges:  $Gait Training: 8-22 mins                    Ellamae Sia, PT, DPT Acute Rehabilitation Services Pager [phone] Office [phone]    Willy Eddy 03/07/2018, 3:28 PM

## 2018-03-07 NOTE — Care Management Note (Signed)
Case Management Note  Patient Details  Name: John Stark MRN: [id number] Date of Birth: [birth year]  Subjective/Objective:    Presents with ARF, Rhabdo. Hx ofafib / Xarelto; HTN; HLD; COPD; stage 3 CKD; bladder cancer; and AAA   He was recently discharged from [facility] following admission from 8/5-16 for acute CHF exacerbation   Resides with wife, [name]. Recently d/c from [facility].   [name] (Spouse) [name] (Daughter)    [phone]      PCP: Donnie Coffin  Action/Plan: Per PT/OT evaluations/ recommendation: SNF. Pt agreeble to SNF placement...CSW aware and following .NCM will continue to monitor for transitional needs. Expected Discharge Date:                  Expected Discharge Plan:  Skilled Nursing Facility  In-House Referral:  Clinical Social Work  Discharge planning Services  CM Consult  Post Acute Care Choice:    Choice offered to:  Patient  DME Arranged:    DME Agency:     HH Arranged:    [place] Agency:     Status of Service:  In process, will continue to follow  If discussed at Long Length of Stay Meetings, dates discussed:    Additional Comments:  Sharin Mons, RN 03/07/2018, 2:17 PM

## 2018-03-07 NOTE — Care Management Note (Signed)
Case Management Note  Patient Details  Name: John Stark MRN: [id number] Date of Birth: [birth year]  Subjective/Objective:  Admitted with ARF/ rehado. Previously admitted on 01/28/18 with CHF and SOB. Cath 8/9 showed severe 2 vessel CAD; deemed not a CABG candidate. Underwent successful ostial LAD PCI on 8/13. PMH includes CHF, HTN, atrial fibrillation .Resides with wife, John Stark. Recently d/c from SNF, [facility]. Pt states own rolator and cane.      John Stark (Spouse) John Stark (Daughter)     [phone]      John Stark       Action/Plan: Per PT/OT evaluations/ recommendations: SNF. Pt agreeable to SNF placement...CSW aware and following. NCM will continue to monitor for transitional care needs.  Expected Discharge Date:  02/08/18               Expected Discharge Plan:  Home/Self Care  In-House Referral:     Discharge planning Services  CM Consult  Post Acute Care Choice:    Choice offered to:  Patient  DME Arranged:    DME Agency:     HH Arranged:   [place] Agency:     Status of Service:  In process, will continue to follow  If discussed at Long Length of Stay Meetings, dates discussed:    Additional Comments:  Sharin Mons, RN 03/07/2018, 2:07 PM

## 2018-03-07 NOTE — NC FL2 (Signed)
[place] MEDICAID FL2 LEVEL OF CARE SCREENING TOOL     IDENTIFICATION  Patient Name: LORNE WINKELS Birthdate: March 28, 1936 Sex: male Admission Date (Current Location): 03/06/2018  [facility] and [place] Number:  Herbalist and Address:  The . [facility] [place], [place] [address], [place], [place] [address]      Provider Number: [id number]  Attending Physician Name and Address:  Hosie Poisson, MD  Relative Name and Phone Number:  Caileb Rhue - wife; [phone]    Current Level of Care: Hospital Recommended Level of Care: [place] Prior Approval Number:    Date Approved/Denied:   PASRR Number: [id number]  Discharge Plan: SNF    Current Diagnoses: Patient Active Problem List   Diagnosis Date Noted  . Acute renal failure (ARF) ([place]) 03/06/2018  . Rhabdomyolysis 03/06/2018  . Sedentary lifestyle 03/06/2018  . Urinary retention 03/06/2018  . Coronary artery disease involving native coronary artery of native heart without angina pectoris   . Acute on chronic combined systolic and diastolic CHF (congestive heart failure) ([place])   . Elevated troponin   . COPD (chronic obstructive pulmonary disease) ([place]) 01/29/2018  . Acute exacerbation of CHF (congestive heart failure) ([place]) 01/28/2018  . Osteoarthritis of right knee 08/07/2012  . Atrial fibrillation ([place]) 06/05/2012  . Chronic combined systolic and diastolic CHF (congestive heart failure) ([place]) 06/15/2009  . Dyslipidemia 12/04/2008  . Overweight 12/04/2008  . Essential hypertension 12/04/2008  . GERD 12/04/2008  . OSTEOARTHRITIS 12/04/2008  . PALPITATIONS 12/04/2008    Orientation RESPIRATION BLADDER Height & Weight     Self, Time, Situation, Place  Normal Continent Weight: 276 lb 14.4 oz (125.6 kg) Height:  5\' 11"  (180.3 cm)  BEHAVIORAL SYMPTOMS/MOOD NEUROLOGICAL BOWEL NUTRITION STATUS      Continent Diet(Heart healthy)  AMBULATORY STATUS COMMUNICATION OF NEEDS Skin    Total Care(Patient did not ambulate with PT during eval on 9/11) Verbally Skin abrasions(Abrasions on ankle, foot, let (right/left))                       Personal Care Assistance Level of Assistance  Bathing, Feeding, Dressing Bathing Assistance: Maximum assistance(Upper body assistance with set-up; Lower body mod assist) Feeding assistance: Independent(Assistance with set-up) Dressing Assistance: Maximum assistance(Upper body min assist; Lower body plus 2 assist)     Functional Limitations Info  Sight, Hearing, Speech Sight Info: Adequate Hearing Info: Impaired Speech Info: Adequate    SPECIAL CARE FACTORS FREQUENCY  PT (By licensed PT), OT (By licensed OT)     PT Frequency: Evaluated 9/11 and a minimum of 3X per week therapy recommended during acute inpatient stay OT Frequency: Evaluated 9/12 and a minimum of 2X per week therapy recommended during acute inpatient stay            Contractures Contractures Info: Not present    Additional Factors Info  Code Status, Allergies Code Status Info: Full Allergies Info: Crestor, Pravastatin, Simvastatin           Current Medications (03/07/2018):  This is the current hospital active medication list Current Facility-Administered Medications  Medication Dose Route Frequency Provider Last Rate Last Dose  . acetaminophen (TYLENOL) tablet 650 mg  650 mg Oral Q6H PRN Karmen Bongo, MD   650 mg at 03/06/18 2148   Or  . acetaminophen (TYLENOL) suppository 650 mg  650 mg Rectal Q6H PRN Karmen Bongo, MD      . albuterol (PROVENTIL) (2.5 MG/3ML) 0.083% nebulizer solution 2.5 mg  2.5 mg Nebulization Q2H PRN Karmen Bongo, MD      . clopidogrel (PLAVIX) tablet 75 mg  75 mg Oral QPM Karmen Bongo, MD   75 mg at 03/07/18 1608  . docusate sodium (COLACE) capsule 100 mg  100 mg Oral BID Karmen Bongo, MD   100 mg at 03/07/18 0957  . doxazosin (CARDURA) tablet 4 mg  4 mg Oral QHS Karmen Bongo, MD   4 mg at 03/06/18 2148  .  famotidine (PEPCID) tablet 20 mg  20 mg Oral Daily Karmen Bongo, MD   20 mg at 03/07/18 0957  . finasteride (PROSCAR) tablet 5 mg  5 mg Oral QHS Karmen Bongo, MD   5 mg at 03/06/18 2148  . ipratropium-albuterol (DUONEB) 0.5-2.5 (3) MG/3ML nebulizer solution 3 mL  3 mL Nebulization Q6H PRN Karmen Bongo, MD      . lactated ringers infusion   Intravenous Continuous Karmen Bongo, MD 50 mL/hr at 03/06/18 1710    . mometasone-formoterol (DULERA) 200-5 MCG/ACT inhaler 2 puff  2 puff Inhalation BID Karmen Bongo, MD   2 puff at 03/07/18 [phone]  . ondansetron (ZOFRAN) tablet 4 mg  4 mg Oral Q6H PRN Karmen Bongo, MD       Or  . ondansetron [facility]) injection 4 mg  4 mg Intravenous Q6H PRN Karmen Bongo, MD      . potassium chloride SA (K-DUR,KLOR-CON) CR tablet 40 mEq  40 mEq Oral BID Hosie Poisson, MD   40 mEq at 03/07/18 1607  . Rivaroxaban (XARELTO) tablet 15 mg  15 mg Oral Q supper Hosie Poisson, MD   15 mg at 03/07/18 1608  . tamsulosin (FLOMAX) capsule 0.4 mg  0.4 mg Oral QPC supper Karmen Bongo, MD   0.4 mg at 03/07/18 1608     Discharge Medications: Please see discharge summary for a list of discharge medications.  Relevant Imaging Results:  Relevant Lab Results:   Additional Information ss#[id number].   Sable Feil, LCSW

## 2018-03-07 NOTE — Progress Notes (Signed)
PROGRESS NOTE    John Stark  ZYS:[id number] DOB: [birth year] DOA: 03/06/2018 PCP: Alroy Dust, L.Marlou Sa, MD    Brief Narrative:  John Stark is a 82 y.o. male with medical history significant of afib on Xarelto; HTN; HLD; COPD; stage 3 CKD; bladder cancer; and AAA presenting with B leg weakness. He was recently discharged from [facility] following admission from 8/5-16 for acute CHF exacerbation.     Assessment & Plan:   Principal Problem:   Acute renal failure (ARF) (HCC) Active Problems:   Dyslipidemia   Essential hypertension   Chronic combined systolic and diastolic CHF (congestive heart failure) (HCC)   Atrial fibrillation (HCC)   COPD (chronic obstructive pulmonary disease) (HCC)   Rhabdomyolysis   Sedentary lifestyle   Urinary retention   Acute renal failure:  Possibly secondary to mild rhabdomyolysis from dehydration and hypoperfusion from hypotension.  Baseline creatinine around 1.3, admitted with a creatinine of 2.36.  Gently hydrate as he has a h/o of chf and repeat renal parameters in am. Holding diuretics at this time.  Avoid nephrotoxins .    Mild rhabdomyolysis from spending the night on the floor.  Holding welchol and hydrating gently.    H/o chronic and diastolic heart failure:  Continue with plavix and xarelto, the dose of xarelto decreased to 15mg  daily.  Last echo showed LVEF of 40 to 45%.  Holding diuretics for AKI now.    Atrial fibrillation on Xarelto: Rate controlled with metoprolol.   Hypertension: bp parameters are borderline.  Holding norvasc and metoprolol today.    COPD: No wheezing heard.    Hyperlipidemia:  Was on welchol at home, which is being held for rhabdomyolysis.   H/o urinary retention Resume Proscar, Flomax, and Cardura.      DVT prophylaxis: xarelto.  Code Status:  FULL CODE.  Family Communication: none at bedside.  Disposition Plan: pending PT evaluation.    Consultants:   Physical  therapy.    Procedures: none.    Antimicrobials:  None.    Subjective: Pt reports fatigue and generalized weakness.   Objective: Vitals:   03/06/18 2249 03/07/18 0513 03/07/18 0842 03/07/18 0920  BP:  101/67  95/70  Pulse: 72 85  74  Resp: 20 18  18   Temp:  98.7 F (37.1 C)  97.8 F (36.6 C)  TempSrc:    Oral  SpO2: 95% 96% 93% 92%  Weight:      Height:        Intake/Output Summary (Last 24 hours) at 03/07/2018 1345 Last data filed at 03/07/2018 1100 Gross per 24 hour  Intake 805.53 ml  Output 975 ml  Net -169.47 ml   Filed Weights   03/06/18 1133 03/06/18 1651  Weight: 117.9 kg 125.6 kg    Examination:  General exam: Appears calm and comfortable  Respiratory system: Clear to auscultation. Respiratory effort normal.  Cardiovascular system: S1 & S2 heard, RRR. No JVD, murmurs, Gastrointestinal system: Abdomen is nondistended, soft and nontender. No organomegaly or masses felt. Normal bowel sounds heard. Central nervous system: Alert and oriented. Able to move all extremities.  Extremities: Symmetric 5 x 5 power. Skin: No rashes, lesions or ulcers Psychiatry:  Mood & affect appropriate.     Data Reviewed: I have personally reviewed following labs and imaging studies  CBC: Recent Labs  Lab 03/06/18 1229 03/07/18 0547  WBC 10.2 10.3  NEUTROABS 7.6  --   HGB 12.2* 11.6*  HCT 38.6* 34.6*  MCV 94.6 91.1  PLT  194 847   Basic Metabolic Panel: Recent Labs  Lab 03/06/18 1229 03/07/18 0547  NA 136 136  K 3.3* 3.1*  CL 100 102  CO2 24 22  GLUCOSE 104* 99  BUN 39* 41*  CREATININE 2.36* 2.25*  CALCIUM 9.0 8.3*   GFR: Estimated Creatinine Clearance: 35.3 mL/min (A) (by C-G formula based on SCr of 2.25 mg/dL (H)). Liver Function Tests: No results for input(s): AST, ALT, ALKPHOS, BILITOT, PROT, ALBUMIN in the last 168 hours. No results for input(s): LIPASE, AMYLASE in the last 168 hours. No results for input(s): AMMONIA in the last 168  hours. Coagulation Profile: No results for input(s): INR, PROTIME in the last 168 hours. Cardiac Enzymes: Recent Labs  Lab 03/06/18 1229 03/07/18 0547  CKTOTAL 1,206* 1,234*   BNP (last 3 results) No results for input(s): PROBNP in the last 8760 hours. HbA1C: No results for input(s): HGBA1C in the last 72 hours. CBG: No results for input(s): GLUCAP in the last 168 hours. Lipid Profile: No results for input(s): CHOL, HDL, LDLCALC, TRIG, CHOLHDL, LDLDIRECT in the last 72 hours. Thyroid Function Tests: No results for input(s): TSH, T4TOTAL, FREET4, T3FREE, THYROIDAB in the last 72 hours. Anemia Panel: No results for input(s): VITAMINB12, FOLATE, FERRITIN, TIBC, IRON, RETICCTPCT in the last 72 hours. Sepsis Labs: No results for input(s): PROCALCITON, LATICACIDVEN in the last 168 hours.  No results found for this or any previous visit (from the past 240 hour(s)).       Radiology Studies: No results found.      Scheduled Meds: . clopidogrel  75 mg Oral QPM  . docusate sodium  100 mg Oral BID  . doxazosin  4 mg Oral QHS  . famotidine  20 mg Oral Daily  . finasteride  5 mg Oral QHS  . mometasone-formoterol  2 puff Inhalation BID  . rivaroxaban  15 mg Oral Q supper  . tamsulosin  0.4 mg Oral QPC supper   Continuous Infusions: . lactated ringers 50 mL/hr at 03/06/18 1710     LOS: 0 days    Time spent: 35 minutes.    Hosie Poisson, MD [facility] Pager [phone]  If 7PM-7AM, please contact night-coverage [email or web address] Password [email or web address] 03/07/2018, 1:45 PM

## 2018-03-08 DIAGNOSIS — N179 Acute kidney failure, unspecified: Secondary | ICD-10-CM | POA: Diagnosis not present

## 2018-03-08 DIAGNOSIS — I480 Paroxysmal atrial fibrillation: Secondary | ICD-10-CM | POA: Diagnosis not present

## 2018-03-08 DIAGNOSIS — R748 Abnormal levels of other serum enzymes: Secondary | ICD-10-CM | POA: Diagnosis not present

## 2018-03-08 DIAGNOSIS — I5042 Chronic combined systolic (congestive) and diastolic (congestive) heart failure: Secondary | ICD-10-CM | POA: Diagnosis not present

## 2018-03-08 LAB — URINALYSIS, ROUTINE W REFLEX MICROSCOPIC
BILIRUBIN URINE: NEGATIVE
Glucose, UA: NEGATIVE mg/dL
Ketones, ur: NEGATIVE mg/dL
Nitrite: NEGATIVE
Protein, ur: 30 mg/dL — AB
SPECIFIC GRAVITY, URINE: 1.012 (ref 1.005–1.030)
WBC, UA: >50 WBC/hpf — ABNORMAL HIGH (ref 0–5)
pH: 6 (ref 5.0–8.0)

## 2018-03-08 LAB — BASIC METABOLIC PANEL
ANION GAP: 12 (ref 5–15)
BUN: 39 mg/dL — ABNORMAL HIGH (ref 8–23)
CALCIUM: 8.4 mg/dL — AB (ref 8.9–10.3)
CHLORIDE: 100 mmol/L (ref 98–111)
CO2: 24 mmol/L (ref 22–32)
Creatinine, Ser: 2 mg/dL — ABNORMAL HIGH (ref 0.61–1.24)
GFR calc non Af Amer: 30 mL/min — ABNORMAL LOW (ref >60)
GFR, EST AFRICAN AMERICAN: 35 mL/min — AB (ref >60)
Glucose, Bld: 144 mg/dL — ABNORMAL HIGH (ref 70–99)
POTASSIUM: 3.7 mmol/L (ref 3.5–5.1)
Sodium: 136 mmol/L (ref 135–145)

## 2018-03-08 LAB — CK: Total CK: 564 U/L — ABNORMAL HIGH (ref 49–397)

## 2018-03-08 LAB — GLUCOSE, CAPILLARY: GLUCOSE-CAPILLARY: 135 mg/dL — AB (ref 70–99)

## 2018-03-08 NOTE — Clinical Social Work Placement (Addendum)
   CLINICAL SOCIAL WORK PLACEMENT  NOTE *03/08/18 - Patient will discharge to [facility] on Saturday, 9/14. *9/13 - Facility responses provided to patient and wife  Date:  03/08/2018  Patient Details  Name: John Stark MRN: [id number] Date of Birth: 12-Apr-1936  Clinical Social Work is seeking post-discharge placement for this patient at the [place] level of care (*CSW will initial, date and re-position this form in  chart as items are completed):  No(Patient's wife had SNF list with her during room visit on 9/12)   Patient/family provided with [place] Work Department's list of facilities offering this level of care within the geographic area requested by the patient (or if unable, by the patient's family).  Yes   Patient/family informed of their freedom to choose among providers that offer the needed level of care, that participate in Medicare, Medicaid or managed care program needed by the patient, have an available bed and are willing to accept the patient.  Yes   Patient/family informed of [facility]'s ownership interest in [facility] and [facility], as well as of the fact that they are under no obligation to receive care at these facilities.  PASRR submitted to EDS on       PASRR number received on       Existing PASRR number confirmed on 03/07/18     FL2 transmitted to all facilities in geographic area requested by pt/family on 03/07/18     FL2 transmitted to all facilities within larger geographic area on       Patient informed that his/her managed care company has contracts with or will negotiate with certain facilities, including the following:        Yes(Patient provided with facility responses)   Patient/family informed of bed offers received on 03/08/18.  Patient chooses bed at  [facility]  Physician recommends and patient chooses bed at      Patient to be transferred to  [facility] on   03/09/18.  Patient to be transferred to facility by  wife.     Patient family notified on   of transfer.  Name of family member notified:        PHYSICIAN       Additional Comment:    _______________________________________________ John Feil, LCSW 03/08/2018, 2:15 PM

## 2018-03-08 NOTE — Clinical Social Work Note (Signed)
Clinical Social Work Assessment  Patient Details  Name: John Stark MRN: [id number] Date of Birth: Nov 11, 1935  Date of referral:  03/06/18               Reason for consult:  Facility Placement, Discharge Planning                Permission sought to share information with:  Family Supports Permission granted to share information::  Yes, Verbal Permission Granted  Name::     John Stark, John Stark, and John Stark::     Relationship::  Wife and daughters  Contact Information:  John Stark - [phone], John Stark - [phone]  Housing/Transportation Living arrangements for the past 2 months:  [place] of Information:  Patient, Spouse Patient Interpreter Needed:  None Criminal Activity/Legal Involvement Pertinent to Current Situation/Hospitalization:  No - Comment as needed Significant Relationships:  Adult Children, Spouse Lives with:  Spouse Do you feel safe going back to the place where you live?  No Need for family participation in patient care:  Yes (Comment)  Care giving concerns: Patient and wife in agreement that for patient's safety, ST rehab is the best discharge plan before returning home.   Social Worker assessment / plan: CSW talked with patient and wife at the bedside. During the conversation daughter John Stark came to see parents. John Stark was sitting up in bed and was alert, oriented and agreeable to talking with CSW regarding his discharge plan. John Stark was in the room and was pleasant and also agreeable to SNF. CSW given information regarding recent d/c from hospital to a SNF and both patient and wife reported that the therapy was not good at that facility and both voiced that this is not an option for rehab at discharge. Patient also reported that he was not happy with the wait for an ambulance last admission. CSW provided with facility preferences for rehab post discharge and when asked, wife still had SNF list from last  hospitalization. Patient informed that he is in his co-pay days with his insurance and patient/wife expressed understanding.  Employment status:  Retired Research officer, political party) PT Recommendations:  [place] / Referral to community resources:  [place] [facility](Wife informed CSW that she has SNF list)  Patient/Family's Response to care:  No concerns expressed by patient or wife regarding care during hospitalization.  Patient/Family's Understanding of and Emotional Response to Diagnosis, Current Treatment, and Prognosis: Patient and wife expressed understanding of the need for ST rehab.  Emotional Assessment Appearance:  Appears stated age Attitude/Demeanor/Rapport:  Engaged Affect (typically observed):  Pleasant, Appropriate Orientation:  Oriented to Self, Oriented to Place, Oriented to  Time, Oriented to Situation Alcohol / Substance use:  Tobacco Use, Alcohol Use, Illicit Drugs(Patient reported that he quit smoking and does not drink or use illicit drug) Psych involvement (Current and /or in the community):  No (Comment)  Discharge Needs  Concerns to be addressed:  Discharge Planning Concerns Readmission within the last 30 days:  Yes Current discharge risk:  None Barriers to Discharge:  Continued Medical Work up   [employer] John Homer, LCSW 03/08/2018, 10:59 AM

## 2018-03-08 NOTE — Progress Notes (Signed)
Patient is admitted for a fall and is on a low bed. Patient refuses to have all four rails up. RN educated patient on safety. Patient agreed and insists on having one rail down "even if I have to bust my head to do it myself". RN put a rail down per patient request. Applied yellow socks, yellow arm band and turned bed alarm on. RN will continue to monitor patient.  Ermalinda Memos, RN

## 2018-03-08 NOTE — Progress Notes (Signed)
PROGRESS NOTE    JAXTON CASALE  XTK:[id number] DOB: 02-21-1936 DOA: 03/06/2018 PCP: Alroy Dust, L.Marlou Sa, MD    Brief Narrative:  John Stark is a 82 y.o. male with medical history significant of afib on Xarelto; HTN; HLD; COPD; stage 3 CKD; bladder cancer; and AAA presenting with B leg weakness. He was recently discharged from [facility] following admission from 8/5-16 for acute CHF exacerbation.     Assessment & Plan:   Principal Problem:   Acute renal failure (ARF) (HCC) Active Problems:   Dyslipidemia   Essential hypertension   Chronic combined systolic and diastolic CHF (congestive heart failure) (HCC)   Atrial fibrillation (HCC)   COPD (chronic obstructive pulmonary disease) (HCC)   Rhabdomyolysis   Sedentary lifestyle   Urinary retention   Acute renal failure:  Possibly secondary to mild rhabdomyolysis from dehydration and hypoperfusion from hypotension.  Baseline creatinine around 1.3, admitted with a creatinine of 2.36, improved to 2 today,not back to baseline yet.  Holding diuretics at this time.  Avoid nephrotoxins.  Check renal parameters in am.    Mild rhabdomyolysis from spending the night on the floor.  Holding welchol and hydrating gently. Repeat CK levels show much improvement.    H/o chronic and diastolic heart failure:  Continue with plavix and xarelto, the dose of xarelto decreased to 15mg  daily.  Last echo showed LVEF of 40 to 45%.  Holding diuretics for AKI now.    Atrial fibrillation on Xarelto: Rate controlled with metoprolol.   Hypertension: bp parameters are normal.  Holding norvasc and metoprolol for another 24 hours, will restart them tomorrow.  Continue to monitor.    COPD: No wheezing heard. Resume nebs a needed.    Hyperlipidemia:  Was on welchol at home, which is being held for rhabdomyolysis.   H/o urinary retention Resume Proscar, Flomax, and Cardura.      DVT prophylaxis: xarelto.  Code Status:  FULL  CODE.  Family Communication: none at bedside.  Disposition Plan: SNF when bed available.    Consultants:   Physical therapy.    Procedures: none.    Antimicrobials:  None.    Subjective: Pt reports puffiness of his hands and legs. Continues to report generalized weakness and fatigue but better than yesterday, though not back to baseline yet.   Objective: Vitals:   03/07/18 2102 03/08/18 0502 03/08/18 0835 03/08/18 0918  BP:  113/74 118/81   Pulse:  91 90   Resp:  18 18   Temp:  98.1 F (36.7 C) 98.4 F (36.9 C)   TempSrc:   Oral   SpO2: 91% 95% 96% 94%  Weight:      Height:        Intake/Output Summary (Last 24 hours) at 03/08/2018 1236 Last data filed at 03/08/2018 0900 Gross per 24 hour  Intake 1020 ml  Output 1301 ml  Net -281 ml   Filed Weights   03/06/18 1133 03/06/18 1651  Weight: 117.9 kg 125.6 kg    Examination:  General exam: Appears calm and comfortable not in distress.  Respiratory system: Clear to auscultation. Respiratory effort normal. No wheezing or rhonchi.  Cardiovascular system: S1 & S2 heard, RRR. No JVD, murmurs, Gastrointestinal system: Abdomen is soft NT ND BS+ Central nervous system: Alert and oriented. Able to move all extremities.  Extremities: Symmetric 5 x 5 power. Skin: No rashes, lesions or ulcers Psychiatry:  Mood & affect appropriate.     Data Reviewed: I have personally reviewed following labs  and imaging studies  CBC: Recent Labs  Lab 03/06/18 1229 03/07/18 0547  WBC 10.2 10.3  NEUTROABS 7.6  --   HGB 12.2* 11.6*  HCT 38.6* 34.6*  MCV 94.6 91.1  PLT 194 264   Basic Metabolic Panel: Recent Labs  Lab 03/06/18 1229 03/07/18 0547 03/08/18 0900  NA 136 136 136  K 3.3* 3.1* 3.7  CL 100 102 100  CO2 24 22 24   GLUCOSE 104* 99 144*  BUN 39* 41* 39*  CREATININE 2.36* 2.25* 2.00*  CALCIUM 9.0 8.3* 8.4*   GFR: Estimated Creatinine Clearance: 39.8 mL/min (A) (by C-G formula based on SCr of 2 mg/dL (H)). Liver  Function Tests: No results for input(s): AST, ALT, ALKPHOS, BILITOT, PROT, ALBUMIN in the last 168 hours. No results for input(s): LIPASE, AMYLASE in the last 168 hours. No results for input(s): AMMONIA in the last 168 hours. Coagulation Profile: No results for input(s): INR, PROTIME in the last 168 hours. Cardiac Enzymes: Recent Labs  Lab 03/06/18 1229 03/07/18 0547 03/08/18 0900  CKTOTAL 1,206* 1,234* 564*   BNP (last 3 results) No results for input(s): PROBNP in the last 8760 hours. HbA1C: No results for input(s): HGBA1C in the last 72 hours. CBG: Recent Labs  Lab 03/07/18 1625  GLUCAP 127*   Lipid Profile: No results for input(s): CHOL, HDL, LDLCALC, TRIG, CHOLHDL, LDLDIRECT in the last 72 hours. Thyroid Function Tests: No results for input(s): TSH, T4TOTAL, FREET4, T3FREE, THYROIDAB in the last 72 hours. Anemia Panel: No results for input(s): VITAMINB12, FOLATE, FERRITIN, TIBC, IRON, RETICCTPCT in the last 72 hours. Sepsis Labs: No results for input(s): PROCALCITON, LATICACIDVEN in the last 168 hours.  No results found for this or any previous visit (from the past 240 hour(s)).       Radiology Studies: No results found.      Scheduled Meds: . clopidogrel  75 mg Oral QPM  . docusate sodium  100 mg Oral BID  . doxazosin  4 mg Oral QHS  . famotidine  20 mg Oral Daily  . finasteride  5 mg Oral QHS  . mometasone-formoterol  2 puff Inhalation BID  . rivaroxaban  15 mg Oral Q supper  . tamsulosin  0.4 mg Oral QPC supper   Continuous Infusions: . lactated ringers 50 mL/hr at 03/07/18 1941     LOS: 0 days    Time spent: 35 minutes.    Hosie Poisson, MD [facility] Pager [phone]  If 7PM-7AM, please contact night-coverage [email or web address] Password [facility] 03/08/2018, 12:36 PM

## 2018-03-08 NOTE — Clinical Social Work Note (Addendum)
Patient medically stable for discharge and will be transferred to [place] center with an [facility] on Saturday 03/09/18.  MD contacted and advised that patient can discharge. Patient and wife aware and in agreement with discharge to [place]. Per [place], admissions director, patient will be in room 213 and the number for report is [phone]. Weekend SW advised.  Lille Karim Givens, MSW, LCSW Licensed Clinical Social Worker [place] [phone]

## 2018-03-08 NOTE — Care Management Obs Status (Signed)
[place] NOTIFICATION   Patient Details  Name: John Stark MRN: [id number] Date of Birth: 1935-12-03   Medicare Observation Status Notification Given:  Yes    Bethena Roys, RN 03/08/2018, 4:00 PM

## 2018-03-09 DIAGNOSIS — I480 Paroxysmal atrial fibrillation: Secondary | ICD-10-CM | POA: Diagnosis not present

## 2018-03-09 DIAGNOSIS — I5042 Chronic combined systolic (congestive) and diastolic (congestive) heart failure: Secondary | ICD-10-CM | POA: Diagnosis not present

## 2018-03-09 DIAGNOSIS — R748 Abnormal levels of other serum enzymes: Secondary | ICD-10-CM | POA: Diagnosis not present

## 2018-03-09 DIAGNOSIS — N179 Acute kidney failure, unspecified: Secondary | ICD-10-CM | POA: Diagnosis not present

## 2018-03-09 LAB — BASIC METABOLIC PANEL
ANION GAP: 9 (ref 5–15)
BUN: 30 mg/dL — ABNORMAL HIGH (ref 8–23)
CALCIUM: 8.3 mg/dL — AB (ref 8.9–10.3)
CHLORIDE: 103 mmol/L (ref 98–111)
CO2: 25 mmol/L (ref 22–32)
CREATININE: 1.68 mg/dL — AB (ref 0.61–1.24)
GFR calc non Af Amer: 37 mL/min — ABNORMAL LOW (ref >60)
GFR, EST AFRICAN AMERICAN: 43 mL/min — AB (ref >60)
GLUCOSE: 118 mg/dL — AB (ref 70–99)
Potassium: 3.8 mmol/L (ref 3.5–5.1)
Sodium: 137 mmol/L (ref 135–145)

## 2018-03-09 LAB — CK: Total CK: 282 U/L (ref 49–397)

## 2018-03-09 LAB — GLUCOSE, CAPILLARY
GLUCOSE-CAPILLARY: 116 mg/dL — AB (ref 70–99)
Glucose-Capillary: 116 mg/dL — ABNORMAL HIGH (ref 70–99)

## 2018-03-09 MED ORDER — DOCUSATE SODIUM 100 MG PO CAPS: 100.0000 mg | ORAL_CAPSULE | Freq: Two times a day (BID) | ORAL | 0 refills | Status: DC | PRN

## 2018-03-09 MED ORDER — TORSEMIDE 20 MG PO TABS: 40.0000 mg | ORAL_TABLET | Freq: Every day | ORAL | 0 refills | Status: DC

## 2018-03-09 NOTE — Clinical Social Work Note (Addendum)
Per CSW note on 9/13 CSW has bed set up for pt at [facility] with 5 day LOG pending insurance auth--CSW confirmed today that [facility] will take pt. However, pt is now stating he is not going to [place] and is staying here at the hospital until Monday so he can go to [facility]. CSW explained to RN that even if pt stays until Monday, on Monday [place] will have to submit for insurance and it could take up to two days to get that authorization. RN reiterated to pt that if he stays after d/c he will be responsible for the additional bill. Pt is requesting number for [facility]-- number provided. RN spoke with MD. CSW continuing to follow if pt will d/c today.  12:46- Per RN now pt is agreeable to go. CSW called pt's spouse and she states pt is not going to [place]. Pt's spouse states MD told her pt could stay until Monday. Pt's spouse was very upset with CSW. CSW explained to pt's spouse that pt is d/c and staying after d/c will result in a additional bill. Pt's spouse states "yall better get your story straight, Im coming up there."   Loletha Grayer, [place]

## 2018-03-09 NOTE — Clinical Social Work Note (Signed)
Pt can d/c to [place] today.   [place], [place]

## 2018-03-09 NOTE — Clinical Social Work Note (Signed)
Clinical Social Worker facilitated patient discharge including contacting patient family and facility to confirm patient discharge plans.  Clinical information faxed to facility and family agreeable with plan.  CSW arranged ambulance transport via [facility] to [facility] (room 213) .  RN to call [phone] for report prior to discharge.  Clinical Social Worker will sign off for now as social work intervention is no longer needed. Please consult [place] again if new need arises.  [place], [place]

## 2018-03-09 NOTE — Progress Notes (Signed)
Attempt to call John Stark to provide report. No answer. No voicemail. Patient provided discharge instructions via AVS. Wife providing transport to [place] via personal vehicle. Patient escorted out via wheelchair. Bartholomew Crews, RN

## 2018-03-09 NOTE — Discharge Summary (Signed)
Physician Discharge Summary  John Stark AVW:[id number] DOB: 1935/09/08 DOA: 03/06/2018  PCP: Alroy Dust, L.Marlou Sa, MD  Admit date: 03/06/2018 Discharge date: 03/09/2018  Admitted From: HOME.  Disposition:  [facility].   Recommendations for Outpatient Follow-up:  1. Follow up with PCP in 1-2 weeks 2. Please obtain BMP/CBC in one week 3. We have held your ACE inhibitor for your AKI, please restart it once your renal parameters stay stable.  4. Because of your recent AKI, we have held your diuretic, resume it after 2 days.   Discharge Condition:stable.  CODE STATUS: FULL CODE.  Diet recommendation: Heart Healthy  Brief/Interim Summary: John Stark a 82 y.o.malewith medical history significant ofafib on Xarelto; HTN; HLD; COPD; stage 3 CKD; bladder cancer; and AAA presenting with B leg weakness. He was recently discharged from [facility] following admission from 8/5-16 for acute CHF exacerbation.   Discharge Diagnoses:  Principal Problem:   Acute renal failure (ARF) (HCC) Active Problems:   Dyslipidemia   Essential hypertension   Chronic combined systolic and diastolic CHF (congestive heart failure) (HCC)   Atrial fibrillation (HCC)   COPD (chronic obstructive pulmonary disease) (HCC)   Rhabdomyolysis   Sedentary lifestyle   Urinary retention   Acute renal failure:  Possibly secondary to mild rhabdomyolysis from dehydration and hypoperfusion from hypotension.  Baseline creatinine around 1.3, admitted with a creatinine of 2.36, improved to 2  To 1.6 today.  Holding diuretics and benazepril for now, resume torsemide in 2 days and benazepril as per your PCP.     Mild rhabdomyolysis from spending the night on the floor.  Hydrated. Resolved.    H/o chronic and diastolic heart failure:  Continue with plavix and xarelto,  Last echo showed LVEF of 40 to 45%.  Holding diuretics for AKI now, resume it after two days.    Atrial fibrillation on  Xarelto: Rate controlled with metoprolol.   Hypertension: bp parameters are normal.  Restart amlodipine on discharge.    COPD: No wheezing heard. Resume nebs a needed.    Hyperlipidemia:  Was on welchol at home, which is being held for rhabdomyolysis but now CK levels wnl, resume on discharge.   H/o urinary retention Resume Proscar, Flomax, and Cardura.    Discharge Instructions  Discharge Instructions    Diet - low sodium heart healthy   Complete by:  As directed    Discharge instructions   Complete by:  As directed    Please follow up with PCP in one week.  We have held your Benazepril for AKI, and restart torsemide on Tuesday.  Please check your BMP on Monday.     Allergies as of 03/09/2018      Reactions   Crestor [rosuvastatin Calcium] Other (See Comments)   Muscle aches    Pravastatin Other (See Comments)   Muscle aches    Simvastatin Other (See Comments)   Muscle aches       Medication List    STOP taking these medications   benazepril 20 MG tablet Commonly known as:  LOTENSIN     TAKE these medications   amLODipine 5 MG tablet Commonly known as:  NORVASC Take 5 mg by mouth daily.   aspirin 81 MG chewable tablet Chew 1 tablet (81 mg total) by mouth daily.   CARDURA 4 MG tablet Generic drug:  doxazosin Take 4 mg by mouth at bedtime.   clopidogrel 75 MG tablet Commonly known as:  PLAVIX Take 1 tablet (75 mg total) by  mouth daily. What changed:  when to take this   docusate sodium 100 MG capsule Commonly known as:  COLACE Take 1 capsule (100 mg total) by mouth 2 (two) times daily as needed for mild constipation.   famotidine 20 MG tablet Commonly known as:  PEPCID Take 1 tablet (20 mg total) by mouth daily.   Fluticasone-Salmeterol 250-50 MCG/DOSE Aepb Commonly known as:  ADVAIR Inhale 1 puff into the lungs 2 (two) times daily.   JUICE PLUS FIBRE PO Take 6 tablets by mouth daily. 2 nuts tablets, 2 fruit tablets, and 2 veggie  tablets   meclizine 25 MG tablet Commonly known as:  ANTIVERT Take 25 mg by mouth 3 (three) times daily as needed for dizziness.   metoprolol succinate 50 MG 24 hr tablet Commonly known as:  TOPROL-XL Take 3 tablets (150 mg total) by mouth daily after supper. Take with or immediately following a meal.   potassium chloride 10 MEQ CR capsule Commonly known as:  MICRO-K Take 10 mEq by mouth daily.   PROAIR HFA 108 (90 Base) MCG/ACT inhaler Generic drug:  albuterol Inhale 2 puffs into the lungs every 4 (four) hours as needed for shortness of breath or wheezing.   PROSCAR 5 MG tablet Generic drug:  finasteride Take 5 mg by mouth at bedtime.   tamsulosin 0.4 MG Caps capsule Commonly known as:  FLOMAX Take 1 capsule (0.4 mg total) by mouth daily after supper.   torsemide 20 MG tablet Commonly known as:  DEMADEX Take 2 tablets (40 mg total) by mouth daily. Start taking on:  03/12/2018 What changed:  These instructions start on 03/12/2018. If you are unsure what to do until then, ask your doctor or other care provider.   vitamin B-12 100 MCG tablet Commonly known as:  CYANOCOBALAMIN Take 100 mcg by mouth daily.   WELCHOL 625 MG tablet Generic drug:  colesevelam Take 1,250 mg by mouth 2 (two) times daily with a meal.   XARELTO 20 MG Tabs tablet Generic drug:  rivaroxaban TAKE 1 TABLET BY MOUTH ONCE DAILY What changed:    how much to take  when to take this      Follow-up Information    Mit[place], L.Marlou Sa, MD. Schedule an appointment as soon as possible for a visit in 1 week(s).   Specialty:  Family Medicine Contact information: [address] [place] [address] [phone]        Burnell Blanks, MD .   Specialty:  Cardiology Contact information: [address] [place] [address] [phone]          Allergies  Allergen Reactions  . Crestor [Rosuvastatin Calcium] Other (See Comments)    Muscle aches   .  Pravastatin Other (See Comments)    Muscle aches   . Simvastatin Other (See Comments)    Muscle aches     Consultations:  Physical therapy   Procedures/Studies:  No results found.    Subjective: No chest pain or sob.   Discharge Exam: Vitals:   03/09/18 0856 03/09/18 1015  BP:  111/90  Pulse:  76  Resp:  18  Temp:  97.8 F (36.6 C)  SpO2: 92% 95%   Vitals:   03/08/18 2033 03/09/18 0419 03/09/18 0856 03/09/18 1015  BP: 124/79 111/78  111/90  Pulse: 99 83  76  Resp: 20 20  18   Temp: 98 F (36.7 C) 98.2 F (36.8 C)  97.8 F (36.6 C)  TempSrc: Oral  Oral  Oral  SpO2: 93% 93% 92% 95%  Weight:      Height:        General: Pt is alert, awake, not in acute distress Cardiovascular: RRR, S1/S2 +, no rubs, no gallops Respiratory: CTA bilaterally, no wheezing, no rhonchi Abdominal: Soft, NT, ND, bowel sounds + Extremities: no edema, no cyanosis    The results of significant diagnostics from this hospitalization (including imaging, microbiology, ancillary and laboratory) are listed below for reference.     Microbiology: No results found for this or any previous visit (from the past 240 hour(s)).   Labs: BNP (last 3 results) Recent Labs    01/28/18 2039  BNP 0,947.0*   Basic Metabolic Panel: Recent Labs  Lab 03/06/18 1229 03/07/18 0547 03/08/18 0900 03/09/18 0937  NA 136 136 136 137  K 3.3* 3.1* 3.7 3.8  CL 100 102 100 103  CO2 24 22 24 25   GLUCOSE 104* 99 144* 118*  BUN 39* 41* 39* 30*  CREATININE 2.36* 2.25* 2.00* 1.68*  CALCIUM 9.0 8.3* 8.4* 8.3*   Liver Function Tests: No results for input(s): AST, ALT, ALKPHOS, BILITOT, PROT, ALBUMIN in the last 168 hours. No results for input(s): LIPASE, AMYLASE in the last 168 hours. No results for input(s): AMMONIA in the last 168 hours. CBC: Recent Labs  Lab 03/06/18 1229 03/07/18 0547  WBC 10.2 10.3  NEUTROABS 7.6  --   HGB 12.2* 11.6*  HCT 38.6* 34.6*  MCV 94.6 91.1  PLT 194 183   Cardiac  Enzymes: Recent Labs  Lab 03/06/18 1229 03/07/18 0547 03/08/18 0900 03/09/18 0937  CKTOTAL 1,206* 1,234* 564* 282   BNP: Invalid input(s): POCBNP CBG: Recent Labs  Lab 03/07/18 1625 03/08/18 2111 03/09/18 0748 03/09/18 1209  GLUCAP 127* 135* 116* 116*   D-Dimer No results for input(s): DDIMER in the last 72 hours. Hgb A1c No results for input(s): HGBA1C in the last 72 hours. Lipid Profile No results for input(s): CHOL, HDL, LDLCALC, TRIG, CHOLHDL, LDLDIRECT in the last 72 hours. Thyroid function studies No results for input(s): TSH, T4TOTAL, T3FREE, THYROIDAB in the last 72 hours.  Invalid input(s): FREET3 Anemia work up No results for input(s): VITAMINB12, FOLATE, FERRITIN, TIBC, IRON, RETICCTPCT in the last 72 hours. Urinalysis    Component Value Date/Time   COLORURINE YELLOW 03/08/2018 1630   APPEARANCEUR CLOUDY (A) 03/08/2018 1630   LABSPEC 1.012 03/08/2018 1630   PHURINE 6.0 03/08/2018 1630   GLUCOSEU NEGATIVE 03/08/2018 1630   HGBUR MODERATE (A) 03/08/2018 1630   BILIRUBINUR NEGATIVE 03/08/2018 1630   KETONESUR NEGATIVE 03/08/2018 1630   PROTEINUR 30 (A) 03/08/2018 1630   UROBILINOGEN 0.2 07/30/2012 1426   NITRITE NEGATIVE 03/08/2018 1630   LEUKOCYTESUR LARGE (A) 03/08/2018 1630   Sepsis Labs Invalid input(s): PROCALCITONIN,  WBC,  LACTICIDVEN Microbiology No results found for this or any previous visit (from the past 240 hour(s)).   Time coordinating discharge: 35  minutes  SIGNED:   Hosie Poisson, MD  [facility] 03/09/2018, 12:38 PM Pager   If 7PM-7AM, please contact night-coverage [email or web address] Password [email or web address]

## 2018-03-12 ENCOUNTER — Ambulatory Visit: Payer: Medicare Other | Admitting: Physician Assistant

## 2018-04-09 ENCOUNTER — Encounter: Payer: Self-pay | Admitting: Cardiology

## 2018-04-09 ENCOUNTER — Ambulatory Visit: Payer: Medicare Other | Admitting: Cardiology

## 2018-04-09 VITALS — BP 102/70 | HR 75 | Ht 71.0 in | Wt 268.0 lb

## 2018-04-09 DIAGNOSIS — I2510 Atherosclerotic heart disease of native coronary artery without angina pectoris: Secondary | ICD-10-CM | POA: Diagnosis not present

## 2018-04-09 DIAGNOSIS — E785 Hyperlipidemia, unspecified: Secondary | ICD-10-CM | POA: Insufficient documentation

## 2018-04-09 DIAGNOSIS — I4821 Permanent atrial fibrillation: Secondary | ICD-10-CM

## 2018-04-09 DIAGNOSIS — I10 Essential (primary) hypertension: Secondary | ICD-10-CM

## 2018-04-09 DIAGNOSIS — J449 Chronic obstructive pulmonary disease, unspecified: Secondary | ICD-10-CM

## 2018-04-09 DIAGNOSIS — N179 Acute kidney failure, unspecified: Secondary | ICD-10-CM

## 2018-04-09 DIAGNOSIS — I5043 Acute on chronic combined systolic (congestive) and diastolic (congestive) heart failure: Secondary | ICD-10-CM

## 2018-04-09 DIAGNOSIS — E782 Mixed hyperlipidemia: Secondary | ICD-10-CM

## 2018-04-09 MED ORDER — TORSEMIDE 20 MG PO TABS: 20.0000 mg | ORAL_TABLET | Freq: Every day | ORAL | 3 refills | Status: AC

## 2018-04-09 NOTE — Patient Instructions (Signed)
Medication Instructions:  STOP: Aspirin  DECREASE: Torsemide to 20 MG daily  If you need a refill on your cardiac medications before your next appointment, please call your pharmacy.   Lab work: None  If you have labs (blood work) drawn today and your tests are completely normal, you will receive your results only by: Marland Kitchen MyChart Message (if you have MyChart) OR . A paper copy in the mail If you have any lab test that is abnormal or we need to change your treatment, we will call you to review the results.  Testing/Procedures: None   Follow-Up: At [facility], you and your health needs are our priority.  As part of our continuing mission to provide you with exceptional heart care, we have created designated Provider Care Teams.  These Care Teams include your primary Cardiologist (physician) and Advanced Practice Providers (APPs -  Physician Assistants and Nurse Practitioners) who all work together to provide you with the care you need, when you need it. You will need a follow up appointment in 2 months.  You may see Lauree Chandler, MD or one of the following Advanced Practice Providers on your designated Care Team:   [place], PA-C Melina Copa, PA-C . Ermalinda Barrios, PA-C  Any Other Special Instructions Will Be Listed Below (If Applicable).

## 2018-04-09 NOTE — Progress Notes (Signed)
Cardiology Office Note:    Date:  04/09/2018   ID:  [name], DOB November 24, 1935, MRN [id number]  PCP:  Aurea Graff.Marlou Sa, MD  Cardiologist:  Lauree Chandler, MD  Referring MD: Aurea Graff.Marlou Sa, MD   Chief Complaint  Patient presents with  . Hospitalization Follow-up    CHF    History of Present Illness:    John Stark is a 82 y.o. male with a past medical history significant for permanent atrial fibrillation on Xarelto, hyperlipidemia, hypertension, BPH, GERD, COPD, and bladder cancer 2018 s/p resection and chemo.   He was recently hospitalized 03/06/2018-03/09/2018 for acute renal failure possibly secondary to rhabdomyolysis from dehydration and hypoperfusion from hypotension.  He had apparently spent a night on the floor after collapsing.  He had elevated CK levels.  His baseline serum creatinine is about 1.3.  On admission creatinine was 2.36 and improved to 1.6 on day of discharge.  His diuretics and ACE inhibitor were placed on hold, to resume torsemide after discharge and benazepril as per PCP.  He was discharged to [facility]. He is back home now.   He had also been admitted to the hospital 01/28/2018-02/08/2018 for acute exacerbation of CHF.  He underwent right and left heart cath on 02/04/2018 with finding of severe two-vessel CAD with LAD stenosis and chronic total occlusion of the mid RCA as well as mildly elevated left heart filling pressure, moderately elevated right heart filling pressure and severe pulmonary hypertension suggestive of restrictive cardiomyopathy.  He was being diuresed with optimization of medical therapy.  On 02/05/2018 he was taken back to the Cath Lab for LAD stenting.  He was recommended concurrent antiplatelet therapy with aspirin for 1 month and clopidogrel for 6 months.  He was continued on Xarelto.  He is here today for follow up alone.  He is looking quite well.  His biggest complaint is back and joint pains.  He has a back stimulator  that helps a little.  His wt is down 30 pounds. He is trying to reduce sodium intake. No chest pain/pressure/tightenss, dyspnea, orthopnea, lightheadedness. He is concerned that he is urinating too much and wetting the bed at night. He stopped taking the torsemide 2 days ago.   Past Medical History:  Diagnosis Date  . AAA (abdominal aortic aneurysm) ([place])    a. infrarenal abdominal aortic aneurysm by CT 11/2017 4.7 x 3.9 cm versus 4.4 x 4 1 cm  . Asthma    "as a child"  . Bladder cancer ([place])   . Carotid artery disease ([place])    a. carotid duplex 2019 with minimal plaque, no significant stenosis.  . CKD (chronic kidney disease), stage III ([place])   . COPD (chronic obstructive pulmonary disease) ([place])   . Edema    FEET/LEGS  . Enlarged prostate   . GERD (gastroesophageal reflux disease)   . Hearing loss in right ear   . Hiatal hernia    By CT  . HLD (hyperlipidemia)   . HTN (hypertension)   . Neuropathy   . Osteoarthrosis, unspecified whether generalized or localized, unspecified site   . Overweight(278.02)   . Permanent atrial fibrillation ([place])    a. Xarelto started 05/22/12.  . Right knee pain    awaiting knee replacement    Past Surgical History:  Procedure Laterality Date  . BACK SURGERY     2011  . CARDIOVERSION  06/18/2012   Procedure: CARDIOVERSION;  Surgeon: Josue Hector, MD;  Location: [facility];  Service:  Cardiovascular;  Laterality: N/A;  . CATARACT EXTRACTION W/PHACO Right 09/07/2016   Procedure: CATARACT EXTRACTION PHACO AND INTRAOCULAR LENS PLACEMENT (IOC);  Surgeon: Eulogio Bear, MD;  Location: [facility];  Service: Ophthalmology;  Laterality: Right;  [place] 42.9 AP% 10.0 CDE 4.52 Fluid pack lot # [id number]  . CATARACT EXTRACTION W/PHACO Left 10/12/2016   Procedure: CATARACT EXTRACTION PHACO AND INTRAOCULAR LENS PLACEMENT (IOC);  Surgeon: Eulogio Bear, MD;  Location: [facility];  Service: Ophthalmology;  Laterality: Left;  [place] 34.2 AP% 9.0 CDE 3.17 Fluid  pack lot # [id number]  . COLONOSCOPY    . CORONARY STENT INTERVENTION N/A 02/05/2018   Procedure: CORONARY STENT INTERVENTION;  Surgeon: Jettie Booze, MD;  Location: [facility];  Service: Cardiovascular;  Laterality: N/A;  . JOINT REPLACEMENT    . RIGHT/LEFT HEART CATH AND CORONARY ANGIOGRAPHY N/A 02/01/2018   Procedure: RIGHT/LEFT HEART CATH AND CORONARY ANGIOGRAPHY;  Surgeon: Nelva Bush, MD;  Location: [facility];  Service: Cardiovascular;  Laterality: N/A;  . SPINAL CORD STIMULATOR INSERTION N/A 02/18/2016   Procedure: LUMBAR SPINAL CORD STIMULATOR INSERTION;  Surgeon: Clydell Hakim, MD;  Location: [facility];  Service: Neurosurgery;  Laterality: N/A;  LUMBAR SPINAL CORD STIMULATOR INSERTION  . TOTAL KNEE ARTHROPLASTY Right 08/07/2012   Procedure: TOTAL KNEE ARTHROPLASTY;  Surgeon: Kerin Salen, MD;  Location: [facility];  Service: Orthopedics;  Laterality: Right;  . TRANSURETHRAL RESECTION OF BLADDER TUMOR N/A 05/04/2017   Procedure: TRANSURETHRAL RESECTION OF BLADDER TUMOR (TURBT)/ INTRAVESICAL CHEMOTHERAPY INSTILLATION;  Surgeon: Kathie Rhodes, MD;  Location: [facility];  Service: Urology;  Laterality: N/A;    Current Medications: Current Meds  Medication Sig  . amLODipine (NORVASC) 5 MG tablet Take 5 mg by mouth daily.   . clopidogrel (PLAVIX) 75 MG tablet Take 75 mg by mouth daily.  Marland Kitchen docusate sodium (COLACE) 100 MG capsule Take 1 capsule (100 mg total) by mouth 2 (two) times daily as needed for mild constipation.  Marland Kitchen doxazosin (CARDURA) 4 MG tablet Take 4 mg by mouth at bedtime.   . famotidine (PEPCID) 20 MG tablet Take 1 tablet (20 mg total) by mouth daily.  . finasteride (PROSCAR) 5 MG tablet Take 5 mg by mouth at bedtime.   . Fluticasone-Salmeterol (ADVAIR) 250-50 MCG/DOSE AEPB Inhale 1 puff into the lungs 2 (two) times daily.  . meclizine (ANTIVERT) 25 MG tablet Take 25 mg by mouth 3 (three) times daily as needed for dizziness.  . metoprolol succinate (TOPROL-XL)  50 MG 24 hr tablet Take 3 tablets (150 mg total) by mouth daily after supper. Take with or immediately following a meal.  . Nutritional Supplements (JUICE PLUS FIBRE PO) Take 6 tablets by mouth daily. 2 nuts tablets, 2 fruit tablets, and 2 veggie tablets  . potassium chloride (MICRO-K) 10 MEQ CR capsule Take 10 mEq by mouth daily.   Marland Kitchen PROAIR HFA 108 (90 Base) MCG/ACT inhaler Inhale 2 puffs into the lungs every 4 (four) hours as needed for shortness of breath or wheezing.   . rivaroxaban (XARELTO) 20 MG TABS tablet Take 20 mg by mouth daily with supper.  . tamsulosin (FLOMAX) 0.4 MG CAPS capsule Take 1 capsule (0.4 mg total) by mouth daily after supper.  . torsemide (DEMADEX) 20 MG tablet Take 1 tablet (20 mg total) by mouth daily.  . vitamin B-12 (CYANOCOBALAMIN) 100 MCG tablet Take 100 mcg by mouth daily.  John Stark 625 MG tablet Take 1,250 mg by mouth 2 (two) times daily  with a meal.   . [DISCONTINUED] aspirin 81 MG chewable tablet Chew 1 tablet (81 mg total) by mouth daily.  . [DISCONTINUED] torsemide (DEMADEX) 20 MG tablet Take 2 tablets (40 mg total) by mouth daily.     Allergies:   Crestor [rosuvastatin calcium]; Pravastatin; and Simvastatin   Social History   Socioeconomic History  . Marital status: Married    Spouse name: Not on file  . Number of children: 2  . Years of education: Not on file  . Highest education level: Not on file  Occupational History  . Occupation: Probation officer [employer]  Social Needs  . Financial resource strain: Not on file  . Food insecurity:    Worry: Not on file    Inability: Not on file  . Transportation needs:    Medical: Not on file    Non-medical: Not on file  Tobacco Use  . Smoking status: Former Smoker    Packs/day: 1.00    Years: 20.00    Pack years: 20.00    Types: Cigarettes    Last attempt to quit: 05/22/1977    Years since quitting: 40.9  . Smokeless tobacco: Never Used  . Tobacco comment: Quit 1978  Substance and Sexual  Activity  . Alcohol use: No  . Drug use: No  . Sexual activity: Not on file  Lifestyle  . Physical activity:    Days per week: Not on file    Minutes per session: Not on file  . Stress: Not on file  Relationships  . Social connections:    Talks on phone: Not on file    Gets together: Not on file    Attends religious service: Not on file    Active member of club or organization: Not on file    Attends meetings of clubs or organizations: Not on file    Relationship status: Not on file  Other Topics Concern  . Not on file  Social History Narrative   Retired. Married. Regularly exercises.      Family History: The patient's family history includes Cancer in his other; Heart attack (age of onset: 24) in his brother. ROS:   Please see the history of present illness.     All other systems reviewed and are negative.  EKGs/Labs/Other Studies Reviewed:    The following studies were reviewed today:  CORONARY STENT INTERVENTION 02/05/18  Conclusion    Ost LAD lesion is 80% stenosed.  Prox LAD lesion is 50% stenosed.  A drug-eluting stent was successfully placed using a STENT ORSIRO 3.0X26, postdilated to 3.75 mm.  Post intervention, there is a 0% residual stenosis.   Recommend to resume Rivaroxaban, at currently prescribed dose and frequency, on 02/06/18.  Recommend concurrent antiplatelet therapy of Aspirin 81mg  daily for 1 month and Clopidogrel 75mg  daily for 6 months.   If there was a bleeding issue and clopidogrel had to be stopped after one month, but before six month, this should be ok given the bioabsorbable polymer with the Orsiro stent.      RIGHT/LEFT HEART CATH AND CORONARY ANGIOGRAPHY  02/04/18   Conclusions: 1. Severe 2-vessel CAD with 80-90% ostial/proximal LAD stenosis and chronic total occlusion of the mid RCA. 2. Mildly elevated left heart filling pressure. 3. Moderately elevated right heart filling pressure. 4. Severe pulmonary hypertension. 5. Low  normal Fick CO/CI.  Recommendations: 1. Equalization of end-diastolic pressures with severe pulmonary hypertension suggestive of restrictive cardiomyopathy. Consider advanced HF consultation for optimization of volume status. 2. Aggressive secondary  prevention and medical therapy. Once he is optimized from a HF standpoint, high risk PCI to the ostial/proximal LAD will need to be considered. 3. Close follow-up of renal function.  Recommend to resume rivaroxaban, at currently prescribed dose and frequency, after decision regarding PCI to LAD.  If PCI is planned, recommend concurrent antiplatelet therapy of clopidogrel 75mg  daily for 12 months.  Restart heparin infusion 2 hours after TR band removal today.  Nelva Bush, MD   Echocardiogram 01/29/2018 Study Conclusions - Left ventricle: The cavity size was normal. Systolic function was   mildly to moderately reduced. The estimated ejection fraction was   in the range of 40% to 45%. Diffuse hypokinesis. There is   akinesis of the inferior myocardium. - Ventricular septum: The contour showed diastolic flattening. - Aortic valve: Valve mobility was restricted. There was trivial   regurgitation. - Aorta: Ascending aortic diameter: 41 mm (S). - Ascending aorta: The ascending aorta was mildly dilated. - Mitral valve: Calcified annulus. There was mild regurgitation. - Right ventricle: The cavity size was severely dilated. Wall   thickness was normal. Systolic function was moderately reduced. - Right atrium: The atrium was severely dilated. - Pulmonary arteries: Systolic pressure was moderately increased.   PA peak pressure: 60 mm Hg (S).    EKG:  EKG is not ordered today.    Recent Labs: 01/28/2018: B Natriuretic Peptide 1,523.4 01/31/2018: TSH 0.979 02/06/2018: ALT 35 03/07/2018: Hemoglobin 11.6; Platelets 183 03/09/2018: BUN 30; Creatinine, Ser 1.68; Potassium 3.8; Sodium 137   Recent Lipid Panel    Component Value Date/Time   CHOL  140 01/31/2018 0303   TRIG 34 01/31/2018 0303   HDL 41 01/31/2018 0303   CHOLHDL 3.4 01/31/2018 0303   VLDL 7 01/31/2018 0303   LDLCALC 92 01/31/2018 0303    Physical Exam:    VS:  BP 102/70   Pulse 75   Ht 5\' 11"  (1.803 m)   Wt 268 lb (121.6 kg)   SpO2 98%   BMI 37.38 kg/m     Wt Readings from Last 3 Encounters:  04/09/18 268 lb (121.6 kg)  03/06/18 276 lb 14.4 oz (125.6 kg)  02/08/18 280 lb 13.9 oz (127.4 kg)     Physical Exam  Constitutional: He is oriented to person, place, and time. He appears well-developed and well-nourished. No distress.  HENT:  Head: Normocephalic and atraumatic.  Neck: Normal range of motion. Neck supple. No JVD present.  Cardiovascular: Normal rate, normal heart sounds and intact distal pulses. An irregularly irregular rhythm present. Exam reveals no gallop and no friction rub.  No murmur heard. Pulmonary/Chest: Effort normal and breath sounds normal. No respiratory distress. He has no wheezes. He has no rales.  Abdominal: Soft. Bowel sounds are normal.  Musculoskeletal: Normal range of motion. He exhibits no edema.  Neurological: He is alert and oriented to person, place, and time.  Skin: Skin is warm and dry.  Psychiatric: He has a normal mood and affect. His behavior is normal. Judgment and thought content normal.  Vitals reviewed.    ASSESSMENT:    1. Coronary artery disease involving native coronary artery of native heart without angina pectoris   2. Acute on chronic combined systolic and diastolic CHF (congestive heart failure) (HCC)   3. Permanent atrial fibrillation   4. Acute renal failure, unspecified acute renal failure type ([place])   5. Essential hypertension   6. Mixed hyperlipidemia   7. Chronic obstructive pulmonary disease, unspecified COPD type ([place])  PLAN:    In order of problems listed above:  CAD: Recent cath in 01/2018 with LAD stenosis treated with DES and chronic total occlusion of the mid RCA.  Plan for 1 month  of aspirin and 6 months of clopidogrel in addition to his Xarelto.  Will stop aspirin now.  Chronic systolic and diastolic heart failure: With severe pulmonary hypertension.  The patient has diuresed with torsemide.  He had a recent hospitalization for rhabdomyolysis after a fall. His wt is down 30 pounds. Recheck labs today.  Decrease torsemide from 40 mg to 20 mg daily. He is off his ACE inhibitor since the hospitalization.  His blood pressure is soft today.  Consider resuming his ACE inhibitor in the future if his renal function remains stable.  Could eliminate the amlodipine when restarting benazepril.  Permanent atrial fibrillation: Rate controlled with beta-blocker.  On Xarelto for stroke risk reduction.  Acute on chronic renal insufficiency: As above recent hospitalization for rhabdomyolysis. Recheck labs today  Hypertension: BP well controlled  Hyperlipidemia: On WelChol, intolerant to multiple statins.  LDL 92.  Continue current therapy.  COPD  Obesity: Body mass index is 37.38 kg/m.  Has recently lost 30 pounds, likely mostly fluid weight.  AAA: This was found on a CTA done 12/20/17 in the ED given c/o abd pain. Per Dr. Angelena Form, It is essentially stable since his last CTA. No indication for surgery currently.      Medication Adjustments/Labs and Tests Ordered: Current medicines are reviewed at length with the patient today.  Concerns regarding medicines are outlined above. Labs and tests ordered and medication changes are outlined in the patient instructions below:  Patient Instructions  Medication Instructions:  STOP: Aspirin  DECREASE: Torsemide to 20 MG daily  If you need a refill on your cardiac medications before your next appointment, please call your pharmacy.   Lab work: None  If you have labs (blood work) drawn today and your tests are completely normal, you will receive your results only by: Marland Kitchen MyChart Message (if you have MyChart) OR . A paper copy in the  mail If you have any lab test that is abnormal or we need to change your treatment, we will call you to review the results.  Testing/Procedures: None   Follow-Up: At [facility], you and your health needs are our priority.  As part of our continuing mission to provide you with exceptional heart care, we have created designated Provider Care Teams.  These Care Teams include your primary Cardiologist (physician) and Advanced Practice Providers (APPs -  Physician Assistants and Nurse Practitioners) who all work together to provide you with the care you need, when you need it. You will need a follow up appointment in 2 months.  You may see Lauree Chandler, MD or one of the following Advanced Practice Providers on your designated Care Team:   Simpson, PA-C Melina Copa, PA-C . Ermalinda Barrios, PA-C  Any Other Special Instructions Will Be Listed Below (If Applicable).       Signed, Daune Perch, NP  04/09/2018 5:11 PM    [facility]

## 2018-04-15 ENCOUNTER — Ambulatory Visit (INDEPENDENT_AMBULATORY_CARE_PROVIDER_SITE_OTHER): Payer: Medicare Other | Admitting: Vascular Surgery

## 2018-04-15 ENCOUNTER — Encounter: Payer: Self-pay | Admitting: Vascular Surgery

## 2018-04-15 VITALS — BP 131/85 | HR 84 | Temp 97.5°F | Resp 26 | Ht 71.0 in | Wt 274.4 lb

## 2018-04-15 DIAGNOSIS — I714 Abdominal aortic aneurysm, without rupture, unspecified: Secondary | ICD-10-CM

## 2018-04-15 NOTE — Progress Notes (Signed)
Vascular and Vein Specialist of [facility]  Patient name: John Stark MRN: [id number] DOB: Oct 29, 1935 Sex: male  REASON FOR CONSULT: Valuation of abdominal aortic aneurysm  Seen today in our [place] office  HPI: John Stark is a 82 y.o. male, who is here today for discussion of his abdominal aortic aneurysm.  He had a CT scan most recently in June 2019 showing maximal diameter of 4.7.  This was up from 4.4cm but it is unclear as what this interval was.  He has no symptoms referable to his aneurysm.  He does have difficulty with congestive heart failure and COPD and also chronic kidney disease stage III.  He does walk with the assistance of a rolling walker.  Past Medical History:  Diagnosis Date  . AAA (abdominal aortic aneurysm) ([place])    a. infrarenal abdominal aortic aneurysm by CT 11/2017 4.7 x 3.9 cm versus 4.4 x 4 1 cm  . Asthma    "as a child"  . Bladder cancer ([place])   . Carotid artery disease ([place])    a. carotid duplex 2019 with minimal plaque, no significant stenosis.  . CKD (chronic kidney disease), stage III ([place])   . COPD (chronic obstructive pulmonary disease) ([place])   . Edema    FEET/LEGS  . Enlarged prostate   . GERD (gastroesophageal reflux disease)   . Hearing loss in right ear   . Hiatal hernia    By CT  . HLD (hyperlipidemia)   . HTN (hypertension)   . Neuropathy   . Osteoarthrosis, unspecified whether generalized or localized, unspecified site   . Overweight(278.02)   . Permanent atrial fibrillation    a. Xarelto started 05/22/12.  . Right knee pain    awaiting knee replacement    Family History  Problem Relation Age of Onset  . Cancer Other   . Heart attack Brother 59       had been on O2 for 10 years before this - COPD/tobacco    SOCIAL HISTORY: Social History   Socioeconomic History  . Marital status: Married    Spouse name: Not on file  . Number of children: 2  . Years of education: Not on  file  . Highest education level: Not on file  Occupational History  . Occupation: Probation officer [employer]  Social Needs  . Financial resource strain: Not on file  . Food insecurity:    Worry: Not on file    Inability: Not on file  . Transportation needs:    Medical: Not on file    Non-medical: Not on file  Tobacco Use  . Smoking status: Former Smoker    Packs/day: 1.00    Years: 20.00    Pack years: 20.00    Types: Cigarettes    Last attempt to quit: 05/22/1977    Years since quitting: 40.9  . Smokeless tobacco: Never Used  . Tobacco comment: Quit 1978  Substance and Sexual Activity  . Alcohol use: No  . Drug use: No  . Sexual activity: Not on file  Lifestyle  . Physical activity:    Days per week: Not on file    Minutes per session: Not on file  . Stress: Not on file  Relationships  . Social connections:    Talks on phone: Not on file    Gets together: Not on file    Attends religious service: Not on file    Active member of club or organization: Not on file  Attends meetings of clubs or organizations: Not on file    Relationship status: Not on file  . Intimate partner violence:    Fear of current or ex partner: Not on file    Emotionally abused: Not on file    Physically abused: Not on file    Forced sexual activity: Not on file  Other Topics Concern  . Not on file  Social History Narrative   Retired. Married. Regularly exercises.     Allergies  Allergen Reactions  . Crestor [Rosuvastatin Calcium] Other (See Comments)    Muscle aches   . Pravastatin Other (See Comments)    Muscle aches   . Simvastatin Other (See Comments)    Muscle aches     Current Outpatient Medications  Medication Sig Dispense Refill  . amLODipine (NORVASC) 5 MG tablet Take 5 mg by mouth daily.     . clopidogrel (PLAVIX) 75 MG tablet Take 75 mg by mouth daily.    Marland Kitchen docusate sodium (COLACE) 100 MG capsule Take 1 capsule (100 mg total) by mouth 2 (two) times daily as needed  for mild constipation. 10 capsule 0  . doxazosin (CARDURA) 4 MG tablet Take 4 mg by mouth at bedtime.     . famotidine (PEPCID) 20 MG tablet Take 1 tablet (20 mg total) by mouth daily. 90 tablet 3  . finasteride (PROSCAR) 5 MG tablet Take 5 mg by mouth at bedtime.     . Fluticasone-Salmeterol (ADVAIR) 250-50 MCG/DOSE AEPB Inhale 1 puff into the lungs 2 (two) times daily.    . metoprolol succinate (TOPROL-XL) 50 MG 24 hr tablet Take 3 tablets (150 mg total) by mouth daily after supper. Take with or immediately following a meal. 90 tablet 0  . Nutritional Supplements (JUICE PLUS FIBRE PO) Take 6 tablets by mouth daily. 2 nuts tablets, 2 fruit tablets, and 2 veggie tablets    . potassium chloride (MICRO-K) 10 MEQ CR capsule Take 10 mEq by mouth daily.     Marland Kitchen PROAIR HFA 108 (90 Base) MCG/ACT inhaler Inhale 2 puffs into the lungs every 4 (four) hours as needed for shortness of breath or wheezing.     . rivaroxaban (XARELTO) 20 MG TABS tablet Take 20 mg by mouth daily with supper.    . tamsulosin (FLOMAX) 0.4 MG CAPS capsule Take 1 capsule (0.4 mg total) by mouth daily after supper. 30 capsule   . torsemide (DEMADEX) 20 MG tablet Take 1 tablet (20 mg total) by mouth daily. 90 tablet 3  . vitamin B-12 (CYANOCOBALAMIN) 100 MCG tablet Take 100 mcg by mouth daily.    [name] 625 MG tablet Take 1,250 mg by mouth 2 (two) times daily with a meal.      No current facility-administered medications for this visit.     REVIEW OF SYSTEMS:  [X]  denotes positive finding, [ ]  denotes negative finding Cardiac  Comments:  Chest pain or chest pressure:    Shortness of breath upon exertion: x   Short of breath when lying flat:    Irregular heart rhythm: x       Vascular    Pain in calf, thigh, or hip brought on by ambulation:    Pain in feet at night that wakes you up from your sleep:     Blood clot in your veins:    Leg swelling:         Pulmonary    Oxygen at home:    Productive cough:  Wheezing:           Neurologic    Sudden weakness in arms or legs:     Sudden numbness in arms or legs:     Sudden onset of difficulty speaking or slurred speech:    Temporary loss of vision in one eye:  x   Problems with dizziness:         Gastrointestinal    Blood in stool:     Vomited blood:         Genitourinary    Burning when urinating:     Blood in urine:        Psychiatric    Major depression:         Hematologic    Bleeding problems:    Problems with blood clotting too easily:        Skin    Rashes or ulcers:        Constitutional    Fever or chills:      PHYSICAL EXAM: Vitals:   04/15/18 1313  BP: 131/85  Pulse: 84  Resp: (!) 26  Temp: (!) 97.5 F (36.4 C)  TempSrc: Temporal  Weight: 274 lb 6.4 oz (124.5 kg)  Height: 5\' 11"  (1.803 m)    GENERAL: The patient is a well-nourished male, in no acute distress. The vital signs are documented above. CARDIOVASCULAR: 2+ radial pulses bilaterally.  Rotted arteries without bruits bilaterally PULMONARY: There is good air exchange  ABDOMEN: Soft and non-tender obese and no aneurysm palpable MUSCULOSKELETAL: There are no major deformities or cyanosis. NEUROLOGIC: No focal weakness or paresthesias are detected. SKIN: There are no ulcers or rashes noted. PSYCHIATRIC: The patient has a normal affect.  DATA:  CT scan from June 2019 was reviewed.  The images revealed maximal diameter of 4.7 cm infrarenal aortic aneurysm.  He does not have any extension of the aneurysm into the iliacs.  He does have a long infrarenal neck and would be a excellent candidate for stent graft.  MEDICAL ISSUES: I discussed both open and stent graft repair of abdominal aortic aneurysm.  I explained that he is well below the threshold of where we would recommend surgery currently.  I did discuss symptoms with aneurysm with him and he knows to report immediately to the emergency room should this occur.  Also recommended that we see him in 6 months in our  [place] office for ultrasound follow-up to rule out enlargement   Rosetta Posner, MD [facility] and [facility] Special[facility] Tel [phone] Pager [phone]

## 2018-05-10 ENCOUNTER — Other Ambulatory Visit: Payer: Self-pay | Admitting: Cardiovascular Disease

## 2018-05-10 NOTE — Telephone Encounter (Signed)
OK to refill

## 2018-05-10 NOTE — Telephone Encounter (Signed)
Pt pharmacy requesting refill on medication that is not prescribed by Dr. Angelena Form. Please address. Thank you.

## 2018-05-28 ENCOUNTER — Encounter: Payer: Medicare Other | Admitting: Vascular Surgery

## 2018-07-04 ENCOUNTER — Encounter (INDEPENDENT_AMBULATORY_CARE_PROVIDER_SITE_OTHER): Payer: Self-pay

## 2018-07-04 ENCOUNTER — Ambulatory Visit: Payer: Medicare Other | Admitting: Cardiovascular Disease

## 2018-07-04 ENCOUNTER — Encounter: Payer: Self-pay | Admitting: Cardiovascular Disease

## 2018-07-04 ENCOUNTER — Telehealth: Payer: Self-pay | Admitting: *Deleted

## 2018-07-04 VITALS — BP 118/88 | HR 90 | Ht 71.0 in | Wt 273.0 lb

## 2018-07-04 DIAGNOSIS — I2510 Atherosclerotic heart disease of native coronary artery without angina pectoris: Secondary | ICD-10-CM | POA: Diagnosis not present

## 2018-07-04 DIAGNOSIS — I5042 Chronic combined systolic (congestive) and diastolic (congestive) heart failure: Secondary | ICD-10-CM | POA: Diagnosis not present

## 2018-07-04 DIAGNOSIS — I10 Essential (primary) hypertension: Secondary | ICD-10-CM

## 2018-07-04 DIAGNOSIS — I272 Pulmonary hypertension, unspecified: Secondary | ICD-10-CM

## 2018-07-04 DIAGNOSIS — I255 Ischemic cardiomyopathy: Secondary | ICD-10-CM

## 2018-07-04 DIAGNOSIS — I4819 Other persistent atrial fibrillation: Secondary | ICD-10-CM

## 2018-07-04 DIAGNOSIS — I714 Abdominal aortic aneurysm, without rupture, unspecified: Secondary | ICD-10-CM

## 2018-07-04 NOTE — Telephone Encounter (Signed)
-----   Message from Thompson Grayer, RN sent at 07/04/2018 10:03 AM EST ----- Regarding: sleep study Dr. Angelena Form has ordered a sleep study for this pt

## 2018-07-04 NOTE — Progress Notes (Signed)
Chief Complaint  Patient presents with  . Follow-up    CAD    History of Present Illness: 83 yo male with past medical history significant for persistent atrial fibrillation, mild carotid artery disease, CAD, AAA, hyperlipidemia, hypertension, BPH, GERD, COPD and bladder cancer who is here today for follow up. He was seen as a new patient in our office in 2010 for evaluation of an abnormal echo. His echo showed normal left ventricular systolic function with mild LVH and mild diastolic dysfunction, mild LAE, trace MR. He was found to have atrial fibrillation in 2013. Echo in 2013 with normal LV size and function. Successful DCCV 06/18/12. He was seen in our office November 2014 and was back in atrial fibrillation. Since he was asymptomatic, we planned rate control with anti-coagulation without plans for another DCCV. He was diagnosed with bladder cancer in September 2018 and has had resection and chemotherapy. I saw him in July 2019 and he discussed darkening of his visual fields. He had been seen by Dr. George Ina in ophthalmology who was concerned about possible embolic events. Carotid dopplers. July 2019 with mild bilateral carotid artery disease. Head CT August 2019 with no acute intracranial process. He was [facility] to [facility] August 2019 with acute CHF. Echo 01/29/18 with LVEF=40-45% with akinesis of the inferior wall. There was mild MR. Cardiac cath 02/04/18 with severe two vessel CAD. A stent was placed in the LAD. The RCA was chronically occluded.   He is here today for follow up. The patient denies any chest pain, dyspnea, palpitations, lower extremity edema, orthopnea, PND, dizziness, near syncope or syncope.   Primary Care Physician: Alroy Dust, L.Marlou Sa, MD  Past Medical History:  Diagnosis Date  . AAA (abdominal aortic aneurysm) ([place])    a. infrarenal abdominal aortic aneurysm by CT 11/2017 4.7 x 3.9 cm versus 4.4 x 4 1 cm  . Asthma    "as a child"  . Bladder cancer ([place])   . CAD (coronary  artery disease)   . Carotid artery disease ([place])    a. carotid duplex 2019 with minimal plaque, no significant stenosis.  . CKD (chronic kidney disease), stage III ([place])   . COPD (chronic obstructive pulmonary disease) ([place])   . Edema    FEET/LEGS  . Enlarged prostate   . GERD (gastroesophageal reflux disease)   . Hearing loss in right ear   . Hiatal hernia    By CT  . HLD (hyperlipidemia)   . HTN (hypertension)   . Neuropathy   . Osteoarthrosis, unspecified whether generalized or localized, unspecified site   . Overweight(278.02)   . Permanent atrial fibrillation    a. Xarelto started 05/22/12.  . Right knee pain    awaiting knee replacement    Past Surgical History:  Procedure Laterality Date  . BACK SURGERY     2011  . CARDIOVERSION  06/18/2012   Procedure: CARDIOVERSION;  Surgeon: Josue Hector, MD;  Location: [facility] ENDOSCOPY;  Service: Cardiovascular;  Laterality: N/A;  . CATARACT EXTRACTION W/PHACO Right 09/07/2016   Procedure: CATARACT EXTRACTION PHACO AND INTRAOCULAR LENS PLACEMENT ([place]);  Surgeon: Eulogio Bear, MD;  Location: [facility] ORS;  Service: Ophthalmology;  Laterality: Right;  [place] 42.9 AP% 10.0 CDE 4.52 Fluid pack lot # [id number]  . CATARACT EXTRACTION W/PHACO Left 10/12/2016   Procedure: CATARACT EXTRACTION PHACO AND INTRAOCULAR LENS PLACEMENT (I[facility]);  Surgeon: Eulogio Bear, MD;  Location: [facility] ORS;  Service: Ophthalmology;  Laterality: Left;  [place] 34.2 AP% 9.0 CDE 3.17  Fluid pack lot # [id number]  . COLONOSCOPY    . CORONARY STENT INTERVENTION N/A 02/05/2018   Procedure: CORONARY STENT INTERVENTION;  Surgeon: Jettie Booze, MD;  Location: [place] CV LAB;  Service: Cardiovascular;  Laterality: N/A;  . JOINT REPLACEMENT    . RIGHT/LEFT HEART CATH AND CORONARY ANGIOGRAPHY N/A 02/01/2018   Procedure: RIGHT/LEFT HEART CATH AND CORONARY ANGIOGRAPHY;  Surgeon: Nelva Bush, MD;  Location: [place] CV LAB;  Service: Cardiovascular;  Laterality: N/A;    . SPINAL CORD STIMULATOR INSERTION N/A 02/18/2016   Procedure: LUMBAR SPINAL CORD STIMULATOR INSERTION;  Surgeon: Clydell Hakim, MD;  Location: [place] NEURO ORS;  Service: Neurosurgery;  Laterality: N/A;  LUMBAR SPINAL CORD STIMULATOR INSERTION  . TOTAL KNEE ARTHROPLASTY Right 08/07/2012   Procedure: TOTAL KNEE ARTHROPLASTY;  Surgeon: Kerin Salen, MD;  Location: [place];  Service: Orthopedics;  Laterality: Right;  . TRANSURETHRAL RESECTION OF BLADDER TUMOR N/A 05/04/2017   Procedure: TRANSURETHRAL RESECTION OF BLADDER TUMOR (TURBT)/ INTRAVESICAL CHEMOTHERAPY INSTILLATION;  Surgeon: Kathie Rhodes, MD;  Location: [facility] ORS;  Service: Urology;  Laterality: N/A;    Current Outpatient Medications  Medication Sig Dispense Refill  . amLODipine (NORVASC) 5 MG tablet Take 5 mg by mouth daily.     . clopidogrel (PLAVIX) 75 MG tablet Take 75 mg by mouth daily.    Marland Kitchen docusate sodium (COLACE) 100 MG capsule Take 1 capsule (100 mg total) by mouth 2 (two) times daily as needed for mild constipation. 10 capsule 0  . doxazosin (CARDURA) 4 MG tablet Take 4 mg by mouth at bedtime.     . famotidine (PEPCID) 20 MG tablet Take 1 tablet (20 mg total) by mouth daily. 90 tablet 3  . finasteride (PROSCAR) 5 MG tablet Take 5 mg by mouth at bedtime.     . Fluticasone-Salmeterol (ADVAIR) 250-50 MCG/DOSE AEPB Inhale 1 puff into the lungs 2 (two) times daily.    . metoprolol succinate (TOPROL-XL) 50 MG 24 hr tablet TAKE 3 TABLETS (150 MG TOTAL) BY MOUTH DAILY AFTER SUPPER. TAKE WITH OR IMMEDIATELY FOLLOWING A MEAL. 90 tablet 6  . Nutritional Supplements (JUICE PLUS FIBRE PO) Take 6 tablets by mouth daily. 2 nuts tablets, 2 fruit tablets, and 2 veggie tablets    . potassium chloride (MICRO-K) 10 MEQ CR capsule Take 10 mEq by mouth daily.     Marland Kitchen PROAIR HFA 108 (90 Base) MCG/ACT inhaler Inhale 2 puffs into the lungs every 4 (four) hours as needed for shortness of breath or wheezing.     . rivaroxaban (XARELTO) 20 MG TABS tablet Take 20 mg  by mouth daily with supper.    . tamsulosin (FLOMAX) 0.4 MG CAPS capsule Take 1 capsule (0.4 mg total) by mouth daily after supper. 30 capsule   . torsemide (DEMADEX) 20 MG tablet Take 1 tablet (20 mg total) by mouth daily. 90 tablet 3  . vitamin B-12 (CYANOCOBALAMIN) 100 MCG tablet Take 100 mcg by mouth daily.    [name] 625 MG tablet Take 1,250 mg by mouth 2 (two) times daily with a meal.      No current facility-administered medications for this visit.     Allergies  Allergen Reactions  . Crestor [Rosuvastatin Calcium] Other (See Comments)    Muscle aches   . Pravastatin Other (See Comments)    Muscle aches   . Simvastatin Other (See Comments)    Muscle aches     Social History   Socioeconomic History  . Marital status:  Married    Spouse name: Not on file  . Number of children: 2  . Years of education: Not on file  . Highest education level: Not on file  Occupational History  . Occupation: Probation officer [employer]  Social Needs  . Financial resource strain: Not on file  . Food insecurity:    Worry: Not on file    Inability: Not on file  . Transportation needs:    Medical: Not on file    Non-medical: Not on file  Tobacco Use  . Smoking status: Former Smoker    Packs/day: 1.00    Years: 20.00    Pack years: 20.00    Types: Cigarettes    Last attempt to quit: 05/22/1977    Years since quitting: 41.1  . Smokeless tobacco: Never Used  . Tobacco comment: Quit 1978  Substance and Sexual Activity  . Alcohol use: No  . Drug use: No  . Sexual activity: Not on file  Lifestyle  . Physical activity:    Days per week: Not on file    Minutes per session: Not on file  . Stress: Not on file  Relationships  . Social connections:    Talks on phone: Not on file    Gets together: Not on file    Attends religious service: Not on file    Active member of club or organization: Not on file    Attends meetings of clubs or organizations: Not on file    Relationship  status: Not on file  . Intimate partner violence:    Fear of current or ex partner: Not on file    Emotionally abused: Not on file    Physically abused: Not on file    Forced sexual activity: Not on file  Other Topics Concern  . Not on file  Social History Narrative   Retired. Married. Regularly exercises.     Family History  Problem Relation Age of Onset  . Cancer Other   . Heart attack Brother 7       had been on O2 for 10 years before this - COPD/tobacco    Review of Systems:  As stated in the HPI and otherwise negative.   BP 118/88   Pulse 90   Ht 5\' 11"  (1.803 m)   Wt 273 lb (123.8 kg)   SpO2 97%   BMI 38.08 kg/m   Physical Examination:  General: Well developed, well nourished, NAD  HEENT: OP clear, mucus membranes moist  SKIN: warm, dry. No rashes. Neuro: No focal deficits  Musculoskeletal: Muscle strength 5/5 all ext  Psychiatric: Mood and affect normal  Neck: No JVD, no carotid bruits, no thyromegaly, no lymphadenopathy.  Lungs:Clear bilaterally, no wheezes, rhonci, crackles Cardiovascular: Regular rate and rhythm. No murmurs, gallops or rubs. Abdomen:Soft. Bowel sounds present. Non-tender.  Extremities: No lower extremity edema. Pulses are 2 + in the bilateral DP/PT.   Echo 01/29/18: Left ventricle: The cavity size was normal. Systolic function was   mildly to moderately reduced. The estimated ejection fraction was   in the range of 40% to 45%. Diffuse hypokinesis. There is   akinesis of the inferior myocardium. - Ventricular septum: The contour showed diastolic flattening. - Aortic valve: Valve mobility was restricted. There was trivial   regurgitation. - Aorta: Ascending aortic diameter: 41 mm (S). - Ascending aorta: The ascending aorta was mildly dilated. - Mitral valve: Calcified annulus. There was mild regurgitation. - Right ventricle: The cavity size was severely dilated. Wall  thickness was normal. Systolic function was moderately reduced. -  Right atrium: The atrium was severely dilated. - Pulmonary arteries: Systolic pressure was moderately increased.   PA peak pressure: 60 mm Hg (S).  EKG:  EKG is not ordered today. The ekg ordered today demonstrates  Recent Labs: 01/28/2018: B Natriuretic Peptide 1,523.4 01/31/2018: TSH 0.979 02/06/2018: ALT 35 03/07/2018: Hemoglobin 11.6; Platelets 183 03/09/2018: BUN 30; Creatinine, Ser 1.68; Potassium 3.8; Sodium 137   Lipid Panel    Component Value Date/Time   CHOL 140 01/31/2018 0303   TRIG 34 01/31/2018 0303   HDL 41 01/31/2018 0303   CHOLHDL 3.4 01/31/2018 0303   VLDL 7 01/31/2018 0303   LDLCALC 92 01/31/2018 0303     Wt Readings from Last 3 Encounters:  07/04/18 273 lb (123.8 kg)  04/15/18 274 lb 6.4 oz (124.5 kg)  04/09/18 268 lb (121.6 kg)     Other studies Reviewed: Additional studies/ records that were reviewed today include: . Review of the above records demonstrates:    Assessment and Plan:   1. Atrial fibrillation, persistent: He is in atrial fibrillation. Rate is controlled. Will continue beta blocker and Xarelto.    2.HTN: BP is well controlled.   3. Chronic systolic and diastolic CHF: At this time, his weight is stable and he has no volume overload. When he was hospitalized in August 2019 there was mention of possible restrictive cardiomyopathy. He had mostly right sided heart failure symptoms though LVEF reduced to 40-45%.  He has inferior akinesis and overall hypokinesis.  PASP was 60 mmHg on echo 01/29/18.  He was found to have equalization of end-diastolic pressures on left and right heart catheterization and severe pulmonary hypertension concerning for restrictive cardiomyopathy.  His right-sided heart failure is thought to be contributed to by obesity hypoventilation/OSA.  Will arrange sleep study  Will continue daily Torsemide. Doing well on current therapy. If worsened heart failure symptoms, would need referral to the advanced heart failure clinic.   4.   CAD without angina: He has no chest pain. Continue Plavix, beta blocker. He is intolerant to multiple statins. Continue Welchol.   5. AAA: This was found on a CTA done 12/20/17 in the ED given c/o abd pain. This is being followed by Dr. Donnetta Hutching.   6. Ischemic cardiomyopathy: Continue beta blocker. No Ace-inh or ARB given chronic kidney disease.   Current medicines are reviewed at length with the patient today.  The patient does not have concerns regarding medicines.  The following changes have been made:  no change  Labs/ tests ordered today include:   Orders Placed This Encounter  Procedures  . Split night study    Disposition:   FU with me in 6  months  Signed, Lauree Chandler, MD 07/04/2018 10:06 AM    [facility] [place], [place], [place]  [address] Phone: [phone]; Fax: [phone]

## 2018-07-04 NOTE — Patient Instructions (Addendum)
Medication Instructions:  Your physician recommends that you continue on your current medications as directed. Please refer to the Current Medication list given to you today.  If you need a refill on your cardiac medications before your next appointment, please call your pharmacy.   Lab work: none If you have labs (blood work) drawn today and your tests are completely normal, you will receive your results only by: Marland Kitchen MyChart Message (if you have MyChart) OR . A paper copy in the mail If you have any lab test that is abnormal or we need to change your treatment, we will call you to review the results.  Testing/Procedures: Your physician has recommended that you have a sleep study. This test records several body functions during sleep, including: brain activity, eye movement, oxygen and carbon dioxide blood levels, heart rate and rhythm, breathing rate and rhythm, the flow of air through your mouth and nose, snoring, body muscle movements, and chest and belly movement.   Follow-Up: At [facility], you and your health needs are our priority.  As part of our continuing mission to provide you with exceptional heart care, we have created designated Provider Care Teams.  These Care Teams include your primary Cardiologist (physician) and Advanced Practice Providers (APPs -  Physician Assistants and Nurse Practitioners) who all work together to provide you with the care you need, when you need it. You will need a follow up appointment in 6 months.  Please call our office 2 months in advance to schedule this appointment.  You may see Lauree Chandler, MD or one of the following Advanced Practice Providers on your designated Care Team:   [place], PA-C Melina Copa, PA-C . Ermalinda Barrios, PA-C  Any Other Special Instructions Will Be Listed Below (If Applicable).

## 2018-07-05 ENCOUNTER — Telehealth: Payer: Self-pay | Admitting: *Deleted

## 2018-07-05 NOTE — Telephone Encounter (Signed)
Staff message sent to Leodis Liverpool, RN no PA is required. Ok to schedule sleep study. Contact information provided. [employer] Decision VE:[id number].

## 2018-07-05 NOTE — Telephone Encounter (Signed)
-----   Message from Thompson Grayer, RN sent at 07/04/2018 10:03 AM EST ----- Regarding: sleep study Dr. Angelena Form has ordered a sleep study for this pt

## 2018-07-11 NOTE — Telephone Encounter (Signed)
Patient is scheduled for lab study on 08/12/18. Patient understands his sleep study will be done at [facility] sleep lab. Patient understands he will receive a sleep packet in a week or so. Patient understands to call if he does not receive the sleep packet in a timely manner. Patient agrees with treatment and thanked me for call Left detailed message on voicemail with date and time of study and informed patient to call back to confirm or reschedule.

## 2018-07-11 NOTE — Telephone Encounter (Signed)
From: Lauralee Evener, CMA  Sent: 07/05/2018 11:53 AM EST  To: Thompson Grayer, RN  Subject: RE: sleep study                  Per [facility] web portal no PA is required. Ok to schedule. Decision ME:[id number]. CALL [phone] TO SCHEDULE.  From: Thompson Grayer, RN  Sent: 07/04/2018 10:03 AM EST  To: Freada Bergeron, CMA, [facility] Sleep Studies  Subject: sleep study

## 2018-07-29 ENCOUNTER — Other Ambulatory Visit: Payer: Self-pay | Admitting: Cardiovascular Disease

## 2018-07-29 NOTE — Telephone Encounter (Signed)
Pt last saw Dr Angelena Form 07/04/18, last labs 04/02/18 Creat 1.51 at [place], age 83, weight 123.8kg, CrCl 68.32, based on CrCl pt is on appropriate dosage of Xarelto 20mg  QD.  Will refill rx.

## 2018-08-12 ENCOUNTER — Ambulatory Visit (HOSPITAL_BASED_OUTPATIENT_CLINIC_OR_DEPARTMENT_OTHER): Payer: Medicare Other | Attending: Cardiovascular Disease | Admitting: Cardiology

## 2018-08-12 VITALS — Ht 71.0 in | Wt 270.0 lb

## 2018-08-12 DIAGNOSIS — I5042 Chronic combined systolic (congestive) and diastolic (congestive) heart failure: Secondary | ICD-10-CM | POA: Insufficient documentation

## 2018-08-12 DIAGNOSIS — R0902 Hypoxemia: Secondary | ICD-10-CM | POA: Insufficient documentation

## 2018-08-12 DIAGNOSIS — I2510 Atherosclerotic heart disease of native coronary artery without angina pectoris: Secondary | ICD-10-CM | POA: Diagnosis not present

## 2018-08-12 DIAGNOSIS — I272 Pulmonary hypertension, unspecified: Secondary | ICD-10-CM | POA: Diagnosis not present

## 2018-08-12 DIAGNOSIS — G4733 Obstructive sleep apnea (adult) (pediatric): Secondary | ICD-10-CM | POA: Insufficient documentation

## 2018-08-12 DIAGNOSIS — I4891 Unspecified atrial fibrillation: Secondary | ICD-10-CM | POA: Diagnosis not present

## 2018-08-12 DIAGNOSIS — I4819 Other persistent atrial fibrillation: Secondary | ICD-10-CM

## 2018-08-12 DIAGNOSIS — I493 Ventricular premature depolarization: Secondary | ICD-10-CM | POA: Insufficient documentation

## 2018-08-12 DIAGNOSIS — I255 Ischemic cardiomyopathy: Secondary | ICD-10-CM | POA: Diagnosis not present

## 2018-08-13 NOTE — Procedures (Signed)
Patient Name: John Stark, John Stark Date: 08/12/2018 Gender: Male D.O.B: 01/13/1936 Age (years): 44 Referring Provider: Burnell Blanks Height (inches): 63 Interpreting Physician: Fransico Him MD, ABSM Weight (lbs): 270 RPSGT: Zadie Rhine BMI: 38 MRN: [id number] Neck Size: 21.50   CLINICAL INFORMATION  Sleep Study Type: Split Night CPAP  Indication for sleep study: Hypertension  Epworth Sleepiness Score: 6  SLEEP STUDY TECHNIQUE  As per the AASM Manual for the Scoring of Sleep and Associated Events v2.3 (April 2016) with a hypopnea requiring 4% desaturations. The channels recorded and monitored were frontal, central and occipital EEG, electrooculogram (EOG), submentalis EMG (chin), nasal and oral airflow, thoracic and abdominal wall motion, anterior tibialis EMG, snore microphone, electrocardiogram, and pulse oximetry. Continuous positive airway pressure (CPAP) was initiated when the patient met split night criteria and was titrated according to treat sleep-disordered breathing.  MEDICATIONS  Medications self-administered by patient taken the night of the study : N/A  RESPIRATORY PARAMETERS  Diagnostic Total AHI (/hr):66.4 RDI (/hr):70.8 OA Index (/hr):21.6 CA Index (/hr):2.0 REM AHI (/hr):N/A NREM AHI (/hr):66.4 Supine AHI (/hr):N/A Non-supine AHI (/hr):66.4 Min O2 Sat (%):84.0 Mean O2 (%):91.6 Time below 88% (min)11.3  Titration Optimal Pressure (cm):14 AHI at Optimal Pressure (/hr):0.0 Min O2 at Optimal Pressure (%):87.0 Supine % at Optimal (%):0 Sleep % at Optimal (%):72   SLEEP ARCHITECTURE  The recording time for the entire night was 421.4 minutes. During a baseline period of 168.9 minutes, the patient slept for 122.0 minutes in REM and nonREM, yielding a sleep efficiency of 72.2%%. Sleep onset after lights out was 45.9 minutes with a REM latency of N/A minutes. The patient spent 1.2%% of the night in stage N1 sleep, 98.8%% in stage N2 sleep,  0.0%% in stage N3 and 0% in REM.  During the titration period of 247.8 minutes, the patient slept for 84.5 minutes in REM and nonREM, yielding a sleep efficiency of 34.1%%. Sleep onset after CPAP initiation was 100.3 minutes with a REM latency of 36.0 minutes. The patient spent 6.5%% of the night in stage N1 sleep, 67.5%% in stage N2 sleep, 0.0%% in stage N3 and 26% in REM.  CARDIAC DATA  The 2 lead EKG demonstrated atrial fibrillation. The mean heart rate was 100.0 beats per minute. Other EKG findings include: PVCs.  LEG MOVEMENT DATA  The total Periodic Limb Movements of Sleep (PLMS) were 0. The PLMS index was 0.0 .  IMPRESSIONS  - Severe obstructive sleep apnea occurred during the diagnostic portion of the study (AHI = 66.4/hour). An optimal PAP pressure was selected for this patient ( 14 cm of water) - No significant central sleep apnea occurred during the diagnostic portion of the study (CAI = 2.0/hour). - Moderate oxygen desaturation was noted during the diagnostic portion of the study (Min O2 =84.0%). - No snoring was audible during the diagnostic portion of the study. - EKG findings include PVCs up to 3 in a row and atrial fibrillation. - Clinically significant periodic limb movements did not occur during sleep.  DIAGNOSIS  - Obstructive Sleep Apnea (327.23 [G47.33 ICD-10]) - Nocturnal Hypoxemia - Atrial Fibrillation - PVCs  RECOMMENDATIONS  - Trial of CPAP therapy on 14 cm H2O with a Large size Fisher&Paykel Full Face Mask Simplus mask and heated humidification. - Avoid alcohol, sedatives and other CNS depressants that may worsen sleep apnea and disrupt normal sleep architecture. - Sleep hygiene should be reviewed to assess factors that may improve sleep quality. - Weight management and regular exercise should be  initiated or continued. - Return to [facility] Center for re-evaluation after 10 weeks of therapy  [Electronically signed] 08/13/2018 08:59 PM  Fransico Him MD,  ABSM Diplomate, American Board of Sleep Medicine

## 2018-08-16 ENCOUNTER — Telehealth: Payer: Self-pay | Admitting: *Deleted

## 2018-08-16 NOTE — Telephone Encounter (Signed)
-----   Message from Sueanne Margarita, MD sent at 08/13/2018  9:03 PM EST ----- Please let patient know that they have significant sleep apnea and had successful PAP titration and will be set up with PAP unit.  Please let DME know that order is in [facility].  Please set patient up for OV in 10 weeks.  Please get an overnight pulse ox on CPAP to make sure there is no residual hypoxemia

## 2018-08-19 NOTE — Telephone Encounter (Signed)
Informed patient of sleep study results and patient understanding was verbalized. Patient understands his sleep study showed  they have significant sleep apnea and had successful PAP titration and will be set up with PAP unit. Order is in [place]. Please set patient up for OV in 10 weeks. Please get an overnight pulse ox on CPAP to make sure there is no residual hypoxemia. Pt is aware and agreeable to these results.  Upon patient request DME selection is [facility]. Patient understands he will be contacted by [facility] to set up his cpap. Patient understands to call if [facility] does not contact him with new setup in a timely manner. Patient understands they will be called once confirmation has been received from [facility] that they have received their new machine to schedule 10 week follow up appointment.  [facility] notified of new cpap order  Please add to airview Patient was grateful for the call and thanked me.

## 2018-08-21 NOTE — Telephone Encounter (Signed)
Patient has a 10 week follow up appointment scheduled for 10/22/18. Patient understands he needs to keep this appointment for insurance compliance. Patient was grateful for the call and thanked me.

## 2018-09-23 ENCOUNTER — Other Ambulatory Visit: Payer: Self-pay | Admitting: Cardiovascular Disease

## 2018-10-21 ENCOUNTER — Telehealth: Payer: Self-pay | Admitting: Cardiology

## 2018-10-21 ENCOUNTER — Encounter: Payer: Self-pay | Admitting: Cardiology

## 2018-10-21 DIAGNOSIS — Z9989 Dependence on other enabling machines and devices: Principal | ICD-10-CM

## 2018-10-21 DIAGNOSIS — E669 Obesity, unspecified: Secondary | ICD-10-CM | POA: Insufficient documentation

## 2018-10-21 DIAGNOSIS — G4733 Obstructive sleep apnea (adult) (pediatric): Secondary | ICD-10-CM

## 2018-10-21 HISTORY — DX: Obstructive sleep apnea (adult) (pediatric): G47.33

## 2018-10-21 NOTE — Progress Notes (Signed)
Virtual Visit via Telephone Note   This visit type was conducted due to national recommendations for restrictions regarding the COVID-19 Pandemic (e.g. social distancing) in an effort to limit this patient's exposure and mitigate transmission in our community.  Due to his co-morbid illnesses, this patient is at least at moderate risk for complications without adequate follow up.  This format is felt to be most appropriate for this patient at this time.  All issues noted in this document were discussed and addressed.  A limited physical exam was performed with this format.  Please refer to the patient's chart for his consent to telehealth for [facility].   Evaluation Performed:  Follow-up visit  This visit type was conducted due to national recommendations for restrictions regarding the COVID-19 Pandemic (e.g. social distancing).  This format is felt to be most appropriate for this patient at this time.  All issues noted in this document were discussed and addressed.  No physical exam was performed (except for noted visual exam findings with Video Visits).  Please refer to the patient's chart (MyChart message for video visits and phone note for telephone visits) for the patient's consent to telehealth for [facility].  Date:  10/22/2018   ID:  John Stark, DOB 19-Jun-1936, MRN [id number]  Patient Location:  Home  Provider location:   [facility]  PCP:  Alroy Dust, Carlean Jews.Marlou Sa, MD  Cardiologist:  Lauree Chandler, MD  Sleep Medicine: Fransico Him, MD Electrophysiologist:  None   Chief Complaint:  OSA  History of Present Illness:    John Stark is a 83 y.o. male who presents via audio/video conferencing for a telehealth visit today.    This is an 83yo male with a hx of AAA, CKD, COPD,  Permanent atrial fibrillation, CAD, HTN who was referred by Dr. Angelena Form for evaluation of OSA.  He says that his main problem is that he cannot get back to sleep after he has to take his dog  out at night. He underwent Split night sleep study.  His Epworth sleepiness score was 6.  He was found to have severe OSA with an AHI of 66.4/hr and nocturnal hypoxemia with O2 sats as low as 84%.  He underwent CPAP titration to 14cm H2O.  He is doing well with his CPAP device and thinks that he has gotten used to it. He says that the CPAP has been a wonderful addition and feels so much better using it.  He tolerates the mask but says that it leaks.  He feels the pressure is adequate.  Since going on CPAP he feels rested in the am and has no significant daytime sleepiness.  He denies any significant mouth or nasal dryness or nasal congestion.  He does not think that he snores.    The patient does not have symptoms concerning for COVID-19 infection (fever, chills, cough, or new shortness of breath).   Prior CV studies:   The following studies were reviewed today:  PAP compliance download  Past Medical History:  Diagnosis Date  . AAA (abdominal aortic aneurysm) ([place])    a. infrarenal abdominal aortic aneurysm by CT 11/2017 4.7 x 3.9 cm versus 4.4 x 4 1 cm  . Asthma    "as a child"  . Bladder cancer ([place])   . CAD (coronary artery disease)   . Carotid artery disease ([place])    a. carotid duplex 2019 with minimal plaque, no significant stenosis.  . CKD (chronic kidney disease), stage III ([place])   .  COPD (chronic obstructive pulmonary disease) ([place])   . Edema    FEET/LEGS  . Enlarged prostate   . GERD (gastroesophageal reflux disease)   . Hearing loss in right ear   . Hiatal hernia    By CT  . HLD (hyperlipidemia)   . HTN (hypertension)   . Neuropathy   . OSA on CPAP 10/21/2018   Severe OSA with an AHI of 66.4/hr and nocturnal hypoxemia with O2 sats as low as 84%.  He underwent CPAP titration to 14cm H2O.  . Osteoarthrosis, unspecified whether generalized or localized, unspecified site   . Overweight(278.02)   . Permanent atrial fibrillation    a. Xarelto started 05/22/12.  . Right knee  pain    awaiting knee replacement   Past Surgical History:  Procedure Laterality Date  . BACK SURGERY     2011  . CARDIOVERSION  06/18/2012   Procedure: CARDIOVERSION;  Surgeon: Josue Hector, MD;  Location: [facility] ENDOSCOPY;  Service: Cardiovascular;  Laterality: N/A;  . CATARACT EXTRACTION W/PHACO Right 09/07/2016   Procedure: CATARACT EXTRACTION PHACO AND INTRAOCULAR LENS PLACEMENT ([place]);  Surgeon: Eulogio Bear, MD;  Location: [facility] ORS;  Service: Ophthalmology;  Laterality: Right;  [place] 42.9 AP% 10.0 CDE 4.52 Fluid pack lot # [id number]  . CATARACT EXTRACTION W/PHACO Left 10/12/2016   Procedure: CATARACT EXTRACTION PHACO AND INTRAOCULAR LENS PLACEMENT ([facility]);  Surgeon: Eulogio Bear, MD;  Location: [facility] ORS;  Service: Ophthalmology;  Laterality: Left;  [place] 34.2 AP% 9.0 CDE 3.17 Fluid pack lot # [id number]  . COLONOSCOPY    . CORONARY STENT INTERVENTION N/A 02/05/2018   Procedure: CORONARY STENT INTERVENTION;  Surgeon: Jettie Booze, MD;  Location: [place] CV LAB;  Service: Cardiovascular;  Laterality: N/A;  . JOINT REPLACEMENT    . RIGHT/LEFT HEART CATH AND CORONARY ANGIOGRAPHY N/A 02/01/2018   Procedure: RIGHT/LEFT HEART CATH AND CORONARY ANGIOGRAPHY;  Surgeon: Nelva Bush, MD;  Location: [place] CV LAB;  Service: Cardiovascular;  Laterality: N/A;  . SPINAL CORD STIMULATOR INSERTION N/A 02/18/2016   Procedure: LUMBAR SPINAL CORD STIMULATOR INSERTION;  Surgeon: Clydell Hakim, MD;  Location: [place] NEURO ORS;  Service: Neurosurgery;  Laterality: N/A;  LUMBAR SPINAL CORD STIMULATOR INSERTION  . TOTAL KNEE ARTHROPLASTY Right 08/07/2012   Procedure: TOTAL KNEE ARTHROPLASTY;  Surgeon: Kerin Salen, MD;  Location: [place];  Service: Orthopedics;  Laterality: Right;  . TRANSURETHRAL RESECTION OF BLADDER TUMOR N/A 05/04/2017   Procedure: TRANSURETHRAL RESECTION OF BLADDER TUMOR (TURBT)/ INTRAVESICAL CHEMOTHERAPY INSTILLATION;  Surgeon: Kathie Rhodes, MD;  Location: [facility] ORS;  Service:  Urology;  Laterality: N/A;     Current Meds  Medication Sig  . amLODipine (NORVASC) 5 MG tablet Take 5 mg by mouth daily.   . clopidogrel (PLAVIX) 75 MG tablet Take 75 mg by mouth daily.  Marland Kitchen doxazosin (CARDURA) 4 MG tablet Take 4 mg by mouth at bedtime.   . famotidine (PEPCID) 20 MG tablet TAKE 1 TABLET BY MOUTH ONCE DAILY  . Fluticasone-Salmeterol (ADVAIR) 250-50 MCG/DOSE AEPB Inhale 1 puff into the lungs 2 (two) times daily.  . metoprolol succinate (TOPROL-XL) 50 MG 24 hr tablet TAKE 3 TABLETS (150 MG TOTAL) BY MOUTH DAILY AFTER SUPPER. TAKE WITH OR IMMEDIATELY FOLLOWING A MEAL.  Marland Kitchen MYRBETRIQ 50 MG TB24 tablet Take 50 mg by mouth daily.  . Nutritional Supplements (JUICE PLUS FIBRE PO) Take 6 tablets by mouth daily. 2 nuts tablets, 2 fruit tablets, and 2 veggie tablets  . potassium chloride (MICRO-K) 10 MEQ  CR capsule Take 10 mEq by mouth daily.   Marland Kitchen PROAIR HFA 108 (90 Base) MCG/ACT inhaler Inhale 2 puffs into the lungs every 4 (four) hours as needed for shortness of breath or wheezing.   . tamsulosin (FLOMAX) 0.4 MG CAPS capsule Take 1 capsule (0.4 mg total) by mouth daily after supper.  . torsemide (DEMADEX) 20 MG tablet Take 1 tablet (20 mg total) by mouth daily.  . vitamin B-12 (CYANOCOBALAMIN) 100 MCG tablet Take 100 mcg by mouth daily.  [name] 625 MG tablet Take 1,250 mg by mouth 2 (two) times daily with a meal.   . XARELTO 20 MG TABS tablet TAKE 1 TABLET BY MOUTH ONCE A DAY     Allergies:   Crestor [rosuvastatin calcium]; Pravastatin; and Simvastatin   Social History   Tobacco Use  . Smoking status: Former Smoker    Packs/day: 1.00    Years: 20.00    Pack years: 20.00    Types: Cigarettes    Last attempt to quit: 05/22/1977    Years since quitting: 41.4  . Smokeless tobacco: Never Used  . Tobacco comment: Quit 1978  Substance Use Topics  . Alcohol use: No  . Drug use: No     Family Hx: The patient's family history includes Cancer in an other family member; Heart  attack (age of onset: 15) in his brother.  ROS:   Please see the history of present illness.     All other systems reviewed and are negative.   Labs/Other Tests and Data Reviewed:    Recent Labs: 01/28/2018: B Natriuretic Peptide 1,523.4 01/31/2018: TSH 0.979 02/06/2018: ALT 35 03/07/2018: Hemoglobin 11.6; Platelets 183 03/09/2018: BUN 30; Creatinine, Ser 1.68; Potassium 3.8; Sodium 137   Recent Lipid Panel Lab Results  Component Value Date/Time   CHOL 140 01/31/2018 03:03 AM   TRIG 34 01/31/2018 03:03 AM   HDL 41 01/31/2018 03:03 AM   CHOLHDL 3.4 01/31/2018 03:03 AM   LDLCALC 92 01/31/2018 03:03 AM    Wt Readings from Last 3 Encounters:  10/22/18 260 lb (117.9 kg)  08/12/18 270 lb (122.5 kg)  07/04/18 273 lb (123.8 kg)     Objective:    Vital Signs:  Ht 5\' 11"  (1.803 m)   Wt 260 lb (117.9 kg) Comment: patient stated weight  BMI 36.26 kg/m    PE was not performed due to technical problems with getting a video access.  ASSESSMENT & PLAN:    1.  OSA - the patient is tolerating PAP therapy well without any problems. The PAP download was reviewed today and showed an AHI of 11.6/hr on 14 cm H2O with 97% compliance in using more than 4 hours nightly.  The patient has been using and benefiting from PAP use and will continue to benefit from therapy. His mask is leaking and I suspect that this is the cause of his elevated AHI.  I have asked him to call [facility] to get a new cushion.  I will repeat d/l in 4 weeks.  2.  Hypertension -  He will continue on Toprol XL 150mg  daily, doxazosin 4mg  qhs and amlodipine 5mg  daily.    3.  Obesity -  I have encouraged him to cut back on carbs and portions. His exercise is limited due to prior back surgery and need to use a walker.  4.  COVID-19 Education:The signs and symptoms of COVID-19 were discussed with the patient and how to seek care for testing (follow up with PCP  or arrange E-visit).  The importance of social distancing was  discussed today.  Patient Risk:   After full review of this patient's clinical status, I feel that they are at least moderate risk at this time.  Time:   Today, I have spent 15 minutes directly with the patient on video discussing medical problems including OSA, HTN, obesity.  We also reviewed the symptoms of COVID 19 and the ways to protect against contracting the virus with telehealth technology.  I spent an additional 5 minutes reviewing patient's chart including PAP compliance download.  Medication Adjustments/Labs and Tests Ordered: Current medicines are reviewed at length with the patient today.  Concerns regarding medicines are outlined above.  Tests Ordered: No orders of the defined types were placed in this encounter.  Medication Changes: No orders of the defined types were placed in this encounter.   Disposition:  Follow up in 1 year(s)  Signed, Fransico Him, MD  10/22/2018 2:03 PM    [place]

## 2018-10-21 NOTE — Telephone Encounter (Signed)
Telehone only/[email or web address] Consent 10/21/18  YOUR CARDIOLOGY TEAM HAS ARRANGED FOR AN E-VISIT FOR YOUR APPOINTMENT - PLEASE REVIEW IMPORTANT INFORMATION BELOW SEVERAL DAYS PRIOR TO YOUR APPOINTMENT  Due to the recent COVID-19 pandemic, we are transitioning in-person office visits to tele-medicine visits in an effort to decrease unnecessary exposure to our patients, their families, and staff. These visits are billed to your insurance just like a normal visit is. We also encourage you to sign up for MyChart if you have not already done so. You will need a smartphone if possible. For patients that do not have this, we can still complete the visit using a regular telephone but do prefer a smartphone to enable video when possible. You may have a family member that lives with you that can help. If possible, we also ask that you have a blood pressure cuff and scale at home to measure your blood pressure, heart rate and weight prior to your scheduled appointment. Patients with clinical needs that need an in-person evaluation and testing will still be able to come to the office if absolutely necessary. If you have any questions, feel free to call our office.      YOUR PROVIDER WILL BE USING THE FOLLOWING PLATFORM TO COMPLETE YOUR VISIT:   [email or web address]        IF USING MYCHART - How to Download the MyChart App to Your SmartPhone   - If Apple, go to [employer] and type in MyChart in the search bar and download the app. If Android, ask patient to go to [employer] and type in [place] in the search bar and download the app. The app is free but as with any other app downloads, your phone may require you to verify saved payment information or Apple/Android password.  - You will need to then log into the app with your MyChart username and password, and select [facility] as your healthcare provider to link the account.  - When it is time for your visit, go to the MyChart app, find appointments, and click  Begin Video Visit. Be sure to Select Allow for your device to access the Microphone and Camera for your visit. You will then be connected, and your provider will be with you shortly.  **If you have any issues connecting or need assistance, please contact MyChart service desk [phone])**  **If using a computer, in order to ensure the best quality for your visit, you will need to use either of the following Internet Browsers: Insurance underwriter or Microsoft Edge**   IF USING DOXIMITY or [email or web address] - The staff will give you instructions on receiving your link to join the meeting the day of your visit.      2-3 DAYS BEFORE YOUR APPOINTMENT  You will receive a telephone call from one of our [facility] team members - your caller ID may say "Unknown caller." If this is a video visit, we will walk you through how to get the video launched on your phone. We will remind you check your blood pressure, heart rate and weight prior to your scheduled appointment. If you have an Apple Watch or Kardia, please upload any pertinent ECG strips the day before or morning of your appointment to [place]. Our staff will also make sure you have reviewed the consent and agree to move forward with your scheduled tele-health visit.     THE DAY OF YOUR APPOINTMENT  Approximately 15 minutes prior to your scheduled appointment, you will receive a telephone call from  one of [facility] team - your caller ID may say "Unknown caller."  Our staff will confirm medications, vital signs for the day and any symptoms you may be experiencing. Please have this information available prior to the time of visit start. It may also be helpful for you to have a pad of paper and pen handy for any instructions given during your visit. They will also walk you through joining the smartphone meeting if this is a video visit.    CONSENT FOR TELE-HEALTH VISIT - PLEASE REVIEW  I hereby voluntarily request, consent and authorize [facility] and its employed or contracted physicians, physician assistants, nurse practitioners or other licensed health care professionals (the Practitioner), to provide me with telemedicine health care services (the Services") as deemed necessary by the treating Practitioner. I acknowledge and consent to receive the Services by the Practitioner via telemedicine. I understand that the telemedicine visit will involve communicating with the Practitioner through live audiovisual communication technology and the disclosure of certain medical information by electronic transmission. I acknowledge that I have been given the opportunity to request an in-person assessment or other available alternative prior to the telemedicine visit and am voluntarily participating in the telemedicine visit.  I understand that I have the right to withhold or withdraw my consent to the use of telemedicine in the course of my care at any time, without affecting my right to future care or treatment, and that the Practitioner or I may terminate the telemedicine visit at any time. I understand that I have the right to inspect all information obtained and/or recorded in the course of the telemedicine visit and may receive copies of available information for a reasonable fee.  I understand that some of the potential risks of receiving the Services via telemedicine include:   Delay or interruption in medical evaluation due to technological equipment failure or disruption;  Information transmitted may not be sufficient (e.g. poor resolution of images) to allow for appropriate medical decision making by the Practitioner; and/or   In rare instances, security protocols could fail, causing a breach of personal health information.  Furthermore, I acknowledge that it is my responsibility to provide information about my medical history, conditions and care that is complete and accurate to the best of my ability. I acknowledge that Practitioner's  advice, recommendations, and/or decision may be based on factors not within their control, such as incomplete or inaccurate data provided by me or distortions of diagnostic images or specimens that may result from electronic transmissions. I understand that the practice of medicine is not an exact science and that Practitioner makes no warranties or guarantees regarding treatment outcomes. I acknowledge that I will receive a copy of this consent concurrently upon execution via email to the email address I last provided but may also request a printed copy by calling the office of [facility].    I understand that my insurance will be billed for this visit.   I have read or had this consent read to me.  I understand the contents of this consent, which adequately explains the benefits and risks of the Services being provided via telemedicine.   I have been provided ample opportunity to ask questions regarding this consent and the Services and have had my questions answered to my satisfaction.  I give my informed consent for the services to be provided through the use of telemedicine in my medical care  By participating in this telemedicine visit I agree to the above.

## 2018-10-22 ENCOUNTER — Other Ambulatory Visit: Payer: Self-pay

## 2018-10-22 ENCOUNTER — Telehealth (INDEPENDENT_AMBULATORY_CARE_PROVIDER_SITE_OTHER): Payer: Medicare Other | Admitting: Cardiology

## 2018-10-22 ENCOUNTER — Encounter: Payer: Self-pay | Admitting: Cardiology

## 2018-10-22 VITALS — Ht 71.0 in | Wt 260.0 lb

## 2018-10-22 DIAGNOSIS — G4733 Obstructive sleep apnea (adult) (pediatric): Secondary | ICD-10-CM | POA: Diagnosis not present

## 2018-10-22 DIAGNOSIS — Z9989 Dependence on other enabling machines and devices: Secondary | ICD-10-CM

## 2018-10-22 DIAGNOSIS — E669 Obesity, unspecified: Secondary | ICD-10-CM | POA: Diagnosis not present

## 2018-10-22 DIAGNOSIS — Z7189 Other specified counseling: Secondary | ICD-10-CM | POA: Diagnosis not present

## 2018-10-22 DIAGNOSIS — I10 Essential (primary) hypertension: Secondary | ICD-10-CM

## 2018-10-22 NOTE — Patient Instructions (Signed)

## 2018-11-27 ENCOUNTER — Other Ambulatory Visit: Payer: Self-pay | Admitting: Cardiovascular Disease

## 2018-11-27 NOTE — Telephone Encounter (Signed)
Age 83, weight 118kg, SCr 1.67 on 11/06/18, CrCl 10mL/min

## 2018-12-10 ENCOUNTER — Telehealth: Payer: Self-pay | Admitting: *Deleted

## 2018-12-10 NOTE — Telephone Encounter (Signed)
-----   Message from Sueanne Margarita, MD sent at 12/04/2018  7:10 PM EDT ----- AHI is too high and mask looks like it is leaking - please find out if patient notices mask leak.  Please find out what mask he is using and when the last time he changed out the cushion was

## 2018-12-10 NOTE — Telephone Encounter (Signed)
Yes, he does notice a leak Large size [employer] Full Face Mask Simplus mask  He got new cushions in May and for June, and July but he states he never changed them out so he is going to do that today and see if that fixes the problem.

## 2018-12-23 ENCOUNTER — Other Ambulatory Visit: Payer: Self-pay | Admitting: Cardiovascular Disease

## 2019-01-13 ENCOUNTER — Telehealth: Payer: Self-pay | Admitting: Cardiology

## 2019-01-13 NOTE — Telephone Encounter (Signed)

## 2019-01-14 ENCOUNTER — Ambulatory Visit (INDEPENDENT_AMBULATORY_CARE_PROVIDER_SITE_OTHER): Payer: Medicare Other | Admitting: Cardiology

## 2019-01-14 ENCOUNTER — Other Ambulatory Visit: Payer: Self-pay

## 2019-01-14 ENCOUNTER — Encounter: Payer: Self-pay | Admitting: Cardiology

## 2019-01-14 VITALS — BP 122/70 | HR 91 | Ht 71.0 in | Wt 275.6 lb

## 2019-01-14 DIAGNOSIS — I5042 Chronic combined systolic (congestive) and diastolic (congestive) heart failure: Secondary | ICD-10-CM | POA: Diagnosis not present

## 2019-01-14 DIAGNOSIS — I2510 Atherosclerotic heart disease of native coronary artery without angina pectoris: Secondary | ICD-10-CM | POA: Diagnosis not present

## 2019-01-14 DIAGNOSIS — I482 Chronic atrial fibrillation, unspecified: Secondary | ICD-10-CM

## 2019-01-14 NOTE — Progress Notes (Signed)
01/14/2019 John Stark   Nov 20, 1935  [id number]  Primary Physician John Stark, L.John Sa, MD Primary Cardiologist: John Chandler, MD  Electrophysiologist: None  Sleep Clinic: John Stark  Reason for Visit/CC: 6 month F/u for CAD, Persistent Atrial Fibrillation and Chronic Diastolic HF  HPI:  John Stark is a 83 y/o male with past medical history significant for persistent atrial fibrillation, chronic a/c w/ Xarelto, mild carotid artery disease, CAD, AAA, hyperlipidemia, hypertension, BPH, GERD, COPD, OSA on CPAP and bladder cancer who is here today for 6 month f/u follow up. He was seen as a new patient in our office in 2010 for evaluation of an abnormal echo. His echo showed normal left ventricular systolic function with mild LVH and mild diastolic dysfunction, mild LAE, trace MR. He was found to have atrial fibrillation in 2013. Echo in 2013 with normal LV size and function. Successful DCCV 06/18/12. He was seen in our office November 2014 and was back in atrial fibrillation. Since he was asymptomatic, we planned rate control with anti-coagulation without plans for another DCCV. He was diagnosed with bladder cancer in September 2018 and has had resection and chemotherapy. John Stark saw him in July 2019 and he discussed darkening of his visual fields. He had been seen by John Stark in ophthalmology who was concerned about possible embolic events. Carotid dopplers July 2019 with mild bilateral carotid artery disease. Head CT August 2019 with no acute intracranial process. He was admitted to [facility] August 2019 with acute CHF. Echo 01/29/18 with LVEF=40-45% with akinesis of the inferior wall. There was mild MR. Cardiac cath 02/04/18 with severe two vessel CAD. A stent was placed in the LAD. The RCA was chronically occluded. He was recently diagnosed w/ OSA and referred to John Stark for CPAP. He has been compliant w/ this.   Today in f/u, he reports he has done well. He denies CP, dyspnea,  palpitations, LEE, syncope/ near syncope. Fully compliant w/ meds. No abnormal bleeding. He denies falls. EKG shows chronic afib that is rate controlled. BP is well controlled. His PCP follows labs. He had recent CBC, BMP and lipid panel. Labs WNL. LDL was slightly above goal at 85 mg/dL, but he is intolerant to statins. He is currently on Welchol and tolerating ok.    Current Meds  Medication Sig  . amLODipine (NORVASC) 5 MG tablet Take 5 mg by mouth daily.   . clopidogrel (PLAVIX) 75 MG tablet Take 75 mg by mouth daily.  Marland Kitchen doxazosin (CARDURA) 4 MG tablet Take 4 mg by mouth at bedtime.   . famotidine (PEPCID) 20 MG tablet TAKE 1 TABLET BY MOUTH ONCE DAILY  . Fluticasone-Salmeterol (ADVAIR) 250-50 MCG/DOSE AEPB Inhale 1 puff into the lungs 2 (two) times daily.  . metoprolol succinate (TOPROL-XL) 50 MG 24 hr tablet TAKE 3 TABLETS (150 MG) BY MOUTH DAILY AFTER SUPPER. TAKE WITH OR IMMEDIATELY FOLLOWING A MEAL.  Marland Kitchen MYRBETRIQ 50 MG TB24 tablet Take 50 mg by mouth daily.  . Nutritional Supplements (JUICE PLUS FIBRE PO) Take 6 tablets by mouth daily. 2 nuts tablets, 2 fruit tablets, and 2 veggie tablets  . potassium chloride (MICRO-K) 10 MEQ CR capsule Take 10 mEq by mouth daily.   Marland Kitchen PROAIR HFA 108 (90 Base) MCG/ACT inhaler Inhale 2 puffs into the lungs every 4 (four) hours as needed for shortness of breath or wheezing.   . rivaroxaban (XARELTO) 20 MG TABS tablet Take 1 tablet (20 mg total) by mouth daily with supper.  . tamsulosin (  FLOMAX) 0.4 MG CAPS capsule Take 1 capsule (0.4 mg total) by mouth daily after supper.  . torsemide (DEMADEX) 20 MG tablet Take 1 tablet (20 mg total) by mouth daily.  . vitamin B-12 (CYANOCOBALAMIN) 100 MCG tablet Take 100 mcg by mouth daily.  John Stark 625 MG tablet Take 1,250 mg by mouth 2 (two) times daily with a meal.    Allergies  Allergen Reactions  . Crestor [Rosuvastatin Calcium] Other (See Comments)    Muscle aches   . Pravastatin Other (See Comments)     Muscle aches   . Simvastatin Other (See Comments)    Muscle aches    Past Medical History:  Diagnosis Date  . AAA (abdominal aortic aneurysm) ([place])    a. infrarenal abdominal aortic aneurysm by CT 11/2017 4.7 x 3.9 cm versus 4.4 x 4 1 cm  . Asthma    "as a child"  . Bladder cancer ([place])   . CAD (coronary artery disease)   . Carotid artery disease ([place])    a. carotid duplex 2019 with minimal plaque, no significant stenosis.  . CKD (chronic kidney disease), stage III ([place])   . COPD (chronic obstructive pulmonary disease) ([place])   . Edema    FEET/LEGS  . Enlarged prostate   . GERD (gastroesophageal reflux disease)   . Hearing loss in right ear   . Hiatal hernia    By CT  . HLD (hyperlipidemia)   . HTN (hypertension)   . Neuropathy   . OSA on CPAP 10/21/2018   Severe OSA with an AHI of 66.4/hr and nocturnal hypoxemia with O2 sats as low as 84%.  He underwent CPAP titration to 14cm H2O.  . Osteoarthrosis, unspecified whether generalized or localized, unspecified site   . Overweight(278.02)   . Permanent atrial fibrillation    a. Xarelto started 05/22/12.  . Right knee pain    awaiting knee replacement   Family History  Problem Relation Age of Onset  . Cancer Other   . Heart attack Brother 53       had been on O2 for 10 years before this - COPD/tobacco   Past Surgical History:  Procedure Laterality Date  . BACK SURGERY     2011  . CARDIOVERSION  06/18/2012   Procedure: CARDIOVERSION;  Surgeon: John Hector, MD;  Location: [facility] ENDOSCOPY;  Service: Cardiovascular;  Laterality: N/A;  . CATARACT EXTRACTION W/PHACO Right 09/07/2016   Procedure: CATARACT EXTRACTION PHACO AND INTRAOCULAR LENS PLACEMENT ([place]);  Surgeon: John Bear, MD;  Location: [facility] ORS;  Service: Ophthalmology;  Laterality: Right;  [place] 42.9 AP% 10.0 CDE 4.52 Fluid pack lot # [id number]  . CATARACT EXTRACTION W/PHACO Left 10/12/2016   Procedure: CATARACT EXTRACTION PHACO AND INTRAOCULAR LENS PLACEMENT ([facility]);   Surgeon: John Bear, MD;  Location: [facility] ORS;  Service: Ophthalmology;  Laterality: Left;  [place] 34.2 AP% 9.0 CDE 3.17 Fluid pack lot # [id number]  . COLONOSCOPY    . CORONARY STENT INTERVENTION N/A 02/05/2018   Procedure: CORONARY STENT INTERVENTION;  Surgeon: Jettie Booze, MD;  Location: [place] CV LAB;  Service: Cardiovascular;  Laterality: N/A;  . JOINT REPLACEMENT    . RIGHT/LEFT HEART CATH AND CORONARY ANGIOGRAPHY N/A 02/01/2018   Procedure: RIGHT/LEFT HEART CATH AND CORONARY ANGIOGRAPHY;  Surgeon: Nelva Bush, MD;  Location: [place] CV LAB;  Service: Cardiovascular;  Laterality: N/A;  . SPINAL CORD STIMULATOR INSERTION N/A 02/18/2016   Procedure: LUMBAR SPINAL CORD STIMULATOR INSERTION;  Surgeon: Clydell Hakim, MD;  Location: [place] NEURO ORS;  Service: Neurosurgery;  Laterality: N/A;  LUMBAR SPINAL CORD STIMULATOR INSERTION  . TOTAL KNEE ARTHROPLASTY Right 08/07/2012   Procedure: TOTAL KNEE ARTHROPLASTY;  Surgeon: Kerin Salen, MD;  Location: [place];  Service: Orthopedics;  Laterality: Right;  . TRANSURETHRAL RESECTION OF BLADDER TUMOR N/A 05/04/2017   Procedure: TRANSURETHRAL RESECTION OF BLADDER TUMOR (TURBT)/ INTRAVESICAL CHEMOTHERAPY INSTILLATION;  Surgeon: Kathie Rhodes, MD;  Location: [facility] ORS;  Service: Urology;  Laterality: N/A;   Social History   Socioeconomic History  . Marital status: Married    Spouse name: Not on file  . Number of children: 2  . Years of education: Not on file  . Highest education level: Not on file  Occupational History  . Occupation: Probation officer [employer]  Social Needs  . Financial resource strain: Not on file  . Food insecurity    Worry: Not on file    Inability: Not on file  . Transportation needs    Medical: Not on file    Non-medical: Not on file  Tobacco Use  . Smoking status: Former Smoker    Packs/day: 1.00    Years: 20.00    Pack years: 20.00    Types: Cigarettes    Quit date: 05/22/1977    Years since  quitting: 41.6  . Smokeless tobacco: Never Used  . Tobacco comment: Quit 1978  Substance and Sexual Activity  . Alcohol use: No  . Drug use: No  . Sexual activity: Not on file  Lifestyle  . Physical activity    Days per week: Not on file    Minutes per session: Not on file  . Stress: Not on file  Relationships  . Social Herbalist on phone: Not on file    Gets together: Not on file    Attends religious service: Not on file    Active member of club or organization: Not on file    Attends meetings of clubs or organizations: Not on file    Relationship status: Not on file  . Intimate partner violence    Fear of current or ex partner: Not on file    Emotionally abused: Not on file    Physically abused: Not on file    Forced sexual activity: Not on file  Other Topics Concern  . Not on file  Social History Narrative   Retired. Married. Regularly exercises.      Lipid Panel     Component Value Date/Time   CHOL 140 01/31/2018 0303   TRIG 34 01/31/2018 0303   HDL 41 01/31/2018 0303   CHOLHDL 3.4 01/31/2018 0303   VLDL 7 01/31/2018 0303   LDLCALC 92 01/31/2018 0303    Review of Systems: General: negative for chills, fever, night sweats or weight changes.  Cardiovascular: negative for chest pain, dyspnea on exertion, edema, orthopnea, palpitations, paroxysmal nocturnal dyspnea or shortness of breath Dermatological: negative for rash Respiratory: negative for cough or wheezing Urologic: negative for hematuria Abdominal: negative for nausea, vomiting, diarrhea, bright red blood per rectum, melena, or hematemesis Neurologic: negative for visual changes, syncope, or dizziness All other systems reviewed and are otherwise negative except as noted above.   Physical Exam:  Blood pressure 122/70, pulse 91, height 5\' 11"  (1.803 m), weight 275 lb 9.6 oz (125 kg), SpO2 95 %.  General appearance: alert, cooperative, no distress and morbidly obese Neck: no carotid bruit and  no JVD Lungs: clear to auscultation bilaterally Heart: irregularly irregular rhythm Extremities: extremities  normal, atraumatic, no cyanosis or edema Pulses: 2+ and symmetric Skin: Skin color, texture, turgor normal. No rashes or lesions Neurologic: Grossly normal  EKG chronic atrial fibrillation 91 bpm  -- personally reviewed   ASSESSMENT AND PLAN:   1.  Chronic atrial fibrillation: rate is controlled on  blocker and he is asymptomatic. On Xarelto for a/c.   2.  Combined systolic and diastolic heart failure: volume ok. No peripheral edema. Lungs are CTAB. No dyspnea. Continue on diuretics. BP is controlled. Continue  blocker. No ACE/ARB due to CKD.   3.  CAD: s/p LAD stent 02/2018. Known CTO of RCA. Denies angina. Continue medical therapy. On Plavix. No ASA due to Xarelto. On  blocker. No statin due to intolerance. No ACE/ARB due to CKD.   4.  Chronic anticoagulation: on Xarelto for afib. He denies abnormal bleeding. No falls.   5.  Hypertension: controlled on current regimen.   6.  Hyperlipidemia: Lipid profile followed by PCP. Recent LDL slightly above goal at 85 mg/dL but intolerant to statins. He is currently on Welchol and tolerating this ok.   7.  AAA: Followed by Dr. Donnetta Hutching. Has not yet required intervention. BP is controlled on  blocker.   8.  Obstructive sleep apnea: Followed in sleep clinic by John Stark.  He reports full nightly compliance with CPAP.   Follow-Up w/ John Stark in 6 months.    Ladoris Gene, MHS [facility] 01/14/2019 3:16 PM

## 2019-01-14 NOTE — Patient Instructions (Addendum)
Medication Instructions:  none If you need a refill on your cardiac medications before your next appointment, please call your pharmacy.   Lab work: none If you have labs (blood work) drawn today and your tests are completely normal, you will receive your results only by: Marland Kitchen MyChart Message (if you have MyChart) OR . A paper copy in the mail If you have any lab test that is abnormal or we need to change your treatment, we will call you to review the results.  Testing/Procedures: none  Follow-Up: At [facility], you and your health needs are our priority.  As part of our continuing mission to provide you with exceptional heart care, we have created designated Provider Care Teams.  These Care Teams include your primary Cardiologist (physician) and Advanced Practice Providers (APPs -  Physician Assistants and Nurse Practitioners) who all work together to provide you with the care you need, when you need it. You will need a follow up appointment in 6 months.  Please call our office 2 months in advance to schedule this appointment.  You may see Lauree Chandler, MD or one of the following Advanced Practice Providers on your designated Care Team:   [place], PA-C Melina Copa, PA-C . Ermalinda Barrios, PA-C  Any Other Special Instructions Will Be Listed Below (If Applicable). WEIGHT:  Please check weight Daily.  If you gain more than 3 lbs in 1 day or 5 lbs in 1 Week, call the office.  [phone]  DASH Eating Plan DASH stands for "Dietary Approaches to Stop Hypertension." The DASH eating plan is a healthy eating plan that has been shown to reduce high blood pressure (hypertension). It may also reduce your risk for type 2 diabetes, heart disease, and stroke. The DASH eating plan may also help with weight loss. What are tips for following this plan?  General guidelines  Avoid eating more than 2,300 mg (milligrams) of salt (sodium) a day. If you have hypertension, you may need to  reduce your sodium intake to 1,500 mg a day.  Limit alcohol intake to no more than 1 drink a day for nonpregnant women and 2 drinks a day for men. One drink equals 12 oz of beer, 5 oz of wine, or 1 oz of hard liquor.  Work with your health care provider to maintain a healthy body weight or to lose weight. Ask what an ideal weight is for you.  Get at least 30 minutes of exercise that causes your heart to beat faster (aerobic exercise) most days of the week. Activities may include walking, swimming, or biking.  Work with your health care provider or diet and nutrition specialist (dietitian) to adjust your eating plan to your individual calorie needs. Reading food labels   Check food labels for the amount of sodium per serving. Choose foods with less than 5 percent of the Daily Value of sodium. Generally, foods with less than 300 mg of sodium per serving fit into this eating plan.  To find whole grains, look for the word "whole" as the first word in the ingredient list. Shopping  Buy products labeled as "low-sodium" or "no salt added."  Buy fresh foods. Avoid canned foods and premade or frozen meals. Cooking  Avoid adding salt when cooking. Use salt-free seasonings or herbs instead of table salt or sea salt. Check with your health care provider or pharmacist before using salt substitutes.  Do not fry foods. Cook foods using healthy methods such as baking, boiling, grilling, and broiling  instead.  Cook with heart-healthy oils, such as olive, canola, soybean, or sunflower oil. Meal planning  Eat a balanced diet that includes: ? 5 or more servings of fruits and vegetables each day. At each meal, try to fill half of your plate with fruits and vegetables. ? Up to 6-8 servings of whole grains each day. ? Less than 6 oz of lean meat, poultry, or fish each day. A 3-oz serving of meat is about the same size as a deck of cards. One egg equals 1 oz. ? 2 servings of low-fat dairy each day. ? A  serving of nuts, seeds, or beans 5 times each week. ? Heart-healthy fats. Healthy fats called Omega-3 fatty acids are found in foods such as flaxseeds and coldwater fish, like sardines, salmon, and mackerel.  Limit how much you eat of the following: ? Canned or prepackaged foods. ? Food that is high in trans fat, such as fried foods. ? Food that is high in saturated fat, such as fatty meat. ? Sweets, desserts, sugary drinks, and other foods with added sugar. ? Full-fat dairy products.  Do not salt foods before eating.  Try to eat at least 2 vegetarian meals each week.  Eat more home-cooked food and less restaurant, buffet, and fast food.  When eating at a restaurant, ask that your food be prepared with less salt or no salt, if possible. What foods are recommended? The items listed may not be a complete list. Talk with your dietitian about what dietary choices are best for you. Grains Whole-grain or whole-wheat bread. Whole-grain or whole-wheat pasta. Brown rice. Modena Morrow. Bulgur. Whole-grain and low-sodium cereals. Pita bread. Low-fat, low-sodium crackers. Whole-wheat flour tortillas. Vegetables Fresh or frozen vegetables (raw, steamed, roasted, or grilled). Low-sodium or reduced-sodium tomato and vegetable juice. Low-sodium or reduced-sodium tomato sauce and tomato paste. Low-sodium or reduced-sodium canned vegetables. Fruits All fresh, dried, or frozen fruit. Canned fruit in natural juice (without added sugar). Meat and other protein foods Skinless chicken or [place]. Ground chicken or [place]. Pork with fat trimmed off. Fish and seafood. Egg whites. Dried beans, peas, or lentils. Unsalted nuts, nut butters, and seeds. Unsalted canned beans. Lean cuts of beef with fat trimmed off. Low-sodium, lean deli meat. Dairy Low-fat (1%) or fat-free (skim) milk. Fat-free, low-fat, or reduced-fat cheeses. Nonfat, low-sodium ricotta or cottage cheese. Low-fat or nonfat yogurt. Low-fat,  low-sodium cheese. Fats and oils Soft margarine without trans fats. Vegetable oil. Low-fat, reduced-fat, or light mayonnaise and salad dressings (reduced-sodium). Canola, safflower, olive, soybean, and sunflower oils. Avocado. Seasoning and other foods Herbs. Spices. Seasoning mixes without salt. Unsalted popcorn and pretzels. Fat-free sweets. What foods are not recommended? The items listed may not be a complete list. Talk with your dietitian about what dietary choices are best for you. Grains Baked goods made with fat, such as croissants, muffins, or some breads. Dry pasta or rice meal packs. Vegetables Creamed or fried vegetables. Vegetables in a cheese sauce. Regular canned vegetables (not low-sodium or reduced-sodium). Regular canned tomato sauce and paste (not low-sodium or reduced-sodium). Regular tomato and vegetable juice (not low-sodium or reduced-sodium). Angie Fava. Olives. Fruits Canned fruit in a light or heavy syrup. Fried fruit. Fruit in cream or butter sauce. Meat and other protein foods Fatty cuts of meat. Ribs. Fried meat. Berniece Salines. Sausage. Bologna and other processed lunch meats. Salami. Fatback. Hotdogs. Bratwurst. Salted nuts and seeds. Canned beans with added salt. Canned or smoked fish. Whole eggs or egg yolks. Chicken or [place] with skin.  Dairy Whole or 2% milk, cream, and half-and-half. Whole or full-fat cream cheese. Whole-fat or sweetened yogurt. Full-fat cheese. Nondairy creamers. Whipped toppings. Processed cheese and cheese spreads. Fats and oils Butter. Stick margarine. Lard. Shortening. Ghee. Bacon fat. Tropical oils, such as coconut, palm kernel, or palm oil. Seasoning and other foods Salted popcorn and pretzels. Onion salt, garlic salt, seasoned salt, table salt, and sea salt. Worcestershire sauce. Tartar sauce. Barbecue sauce. Teriyaki sauce. Soy sauce, including reduced-sodium. Steak sauce. Canned and packaged gravies. Fish sauce. Oyster sauce. Cocktail sauce.  Horseradish that you find on the shelf. Ketchup. Mustard. Meat flavorings and tenderizers. Bouillon cubes. Hot sauce and Tabasco sauce. Premade or packaged marinades. Premade or packaged taco seasonings. Relishes. Regular salad dressings. Where to find more information:  National Heart, Lung, and [place]: https://wilson-eaton.com/  American Heart Association: www.heart.org Summary  The DASH eating plan is a healthy eating plan that has been shown to reduce high blood pressure (hypertension). It may also reduce your risk for type 2 diabetes, heart disease, and stroke.  With the DASH eating plan, you should limit salt (sodium) intake to 2,300 mg a day. If you have hypertension, you may need to reduce your sodium intake to 1,500 mg a day.  When on the DASH eating plan, aim to eat more fresh fruits and vegetables, whole grains, lean proteins, low-fat dairy, and heart-healthy fats.  Work with your health care provider or diet and nutrition specialist (dietitian) to adjust your eating plan to your individual calorie needs. This information is not intended to replace advice given to you by your health care provider. Make sure you discuss any questions you have with your health care provider. Document Released: 06/01/2011 Document Revised: 05/25/2017 Document Reviewed: 06/05/2016 Elsevier Patient Education  2020 [employer].

## 2019-05-07 ENCOUNTER — Other Ambulatory Visit: Payer: Self-pay | Admitting: Cardiovascular Disease

## 2019-05-08 NOTE — Telephone Encounter (Signed)
Pt's pharmacy is requesting a refill on famotidine. Would Dr. Angelena Form like to refill this medication? Please address

## 2019-05-08 NOTE — Telephone Encounter (Signed)
Okay to refill this medication?

## 2019-05-29 ENCOUNTER — Other Ambulatory Visit: Payer: Self-pay | Admitting: Cardiovascular Disease

## 2019-05-31 ENCOUNTER — Other Ambulatory Visit: Payer: Self-pay | Admitting: Cardiovascular Disease

## 2019-06-02 NOTE — Telephone Encounter (Signed)
Prescription refill request for Xarelto received.   Last office visit: John Stark 01/14/2019 Weight: 125 kg Age: 83 y.o. Scr: 1.84, 05/09/2019 CrCl: 56 ml/min   Prescription refill sent.

## 2019-08-04 ENCOUNTER — Encounter: Payer: Self-pay | Admitting: Cardiovascular Disease

## 2019-08-04 ENCOUNTER — Other Ambulatory Visit: Payer: Self-pay

## 2019-08-04 ENCOUNTER — Ambulatory Visit: Payer: Medicare Other | Admitting: Cardiovascular Disease

## 2019-08-04 VITALS — BP 130/68 | HR 78 | Ht 71.0 in | Wt 277.6 lb

## 2019-08-04 DIAGNOSIS — I10 Essential (primary) hypertension: Secondary | ICD-10-CM | POA: Diagnosis not present

## 2019-08-04 DIAGNOSIS — I5042 Chronic combined systolic (congestive) and diastolic (congestive) heart failure: Secondary | ICD-10-CM

## 2019-08-04 DIAGNOSIS — I4819 Other persistent atrial fibrillation: Secondary | ICD-10-CM

## 2019-08-04 DIAGNOSIS — I2510 Atherosclerotic heart disease of native coronary artery without angina pectoris: Secondary | ICD-10-CM

## 2019-08-04 DIAGNOSIS — I714 Abdominal aortic aneurysm, without rupture, unspecified: Secondary | ICD-10-CM

## 2019-08-04 DIAGNOSIS — I255 Ischemic cardiomyopathy: Secondary | ICD-10-CM

## 2019-08-04 DIAGNOSIS — G4733 Obstructive sleep apnea (adult) (pediatric): Secondary | ICD-10-CM | POA: Diagnosis not present

## 2019-08-04 DIAGNOSIS — Z9989 Dependence on other enabling machines and devices: Secondary | ICD-10-CM

## 2019-08-04 NOTE — Progress Notes (Signed)
Chief Complaint  Patient presents with  . Follow-up    CAD    History of Present Illness: 84 yo male with past medical history significant for persistent atrial fibrillation, mild carotid artery disease, CAD, AAA, hyperlipidemia, hypertension, BPH, GERD, COPD, sleep apnea on CPAP and bladder cancer who is here today for follow up. Echo in 2010 with normal left ventricular systolic function with mild LVH and mild diastolic dysfunction, mild LAE, trace MR. He was found to have atrial fibrillation in 2013. Echo in 2013 with normal LV size and function. Successful DCCV 06/18/12. He was seen in our office November 2014 and was back in atrial fibrillation. Since he was asymptomatic, we planned rate control with anti-coagulation without plans for another DCCV. He was diagnosed with bladder cancer in September 2018 and has had resection and chemotherapy. I saw him in July 2019 and he discussed darkening of his visual fields. He had been seen by Dr. George Ina in ophthalmology who was concerned about possible embolic events. Carotid dopplers. July 2019 with mild bilateral carotid artery disease. Head CT August 2019 with no acute intracranial process. He was admitted to [facility] August 2019 with acute CHF. Echo 01/29/18 with LVEF=40-45% with akinesis of the inferior wall. There was mild MR. Cardiac cath 02/04/18 with severe two vessel CAD. A stent was placed in the LAD. The RCA was chronically occluded. He has been diagnosed with sleep apnea. He is now on CPAP and followed by Dr. Radford Pax.   He is here today for follow up. The patient denies any chest pain, dyspnea, palpitations, lower extremity edema, orthopnea, PND, dizziness, near syncope or syncope.   Primary Care Physician: Alroy Dust, L.Marlou Sa, MD  Past Medical History:  Diagnosis Date  . AAA (abdominal aortic aneurysm) ([place])    a. infrarenal abdominal aortic aneurysm by CT 11/2017 4.7 x 3.9 cm versus 4.4 x 4 1 cm  . Asthma    "as a child"  . Bladder cancer  ([place])   . CAD (coronary artery disease)   . Carotid artery disease ([place])    a. carotid duplex 2019 with minimal plaque, no significant stenosis.  . CKD (chronic kidney disease), stage III   . COPD (chronic obstructive pulmonary disease) ([place])   . Edema    FEET/LEGS  . Enlarged prostate   . GERD (gastroesophageal reflux disease)   . Hearing loss in right ear   . Hiatal hernia    By CT  . HLD (hyperlipidemia)   . HTN (hypertension)   . Neuropathy   . OSA on CPAP 10/21/2018   Severe OSA with an AHI of 66.4/hr and nocturnal hypoxemia with O2 sats as low as 84%.  He underwent CPAP titration to 14cm H2O.  . Osteoarthrosis, unspecified whether generalized or localized, unspecified site   . Overweight(278.02)   . Permanent atrial fibrillation ([place])    a. Xarelto started 05/22/12.  . Right knee pain    awaiting knee replacement    Past Surgical History:  Procedure Laterality Date  . BACK SURGERY     2011  . CARDIOVERSION  06/18/2012   Procedure: CARDIOVERSION;  Surgeon: Josue Hector, MD;  Location: [facility] ENDOSCOPY;  Service: Cardiovascular;  Laterality: N/A;  . CATARACT EXTRACTION W/PHACO Right 09/07/2016   Procedure: CATARACT EXTRACTION PHACO AND INTRAOCULAR LENS PLACEMENT ([place]);  Surgeon: Eulogio Bear, MD;  Location: [facility] ORS;  Service: Ophthalmology;  Laterality: Right;  [place] 42.9 AP% 10.0 CDE 4.52 Fluid pack lot # [id number]  . CATARACT EXTRACTION  W/PHACO Left 10/12/2016   Procedure: CATARACT EXTRACTION PHACO AND INTRAOCULAR LENS PLACEMENT (IOC);  Surgeon: Eulogio Bear, MD;  Location: [facility] ORS;  Service: Ophthalmology;  Laterality: Left;  [place] 34.2 AP% 9.0 CDE 3.17 Fluid pack lot # [id number]  . COLONOSCOPY    . CORONARY STENT INTERVENTION N/A 02/05/2018   Procedure: CORONARY STENT INTERVENTION;  Surgeon: Jettie Booze, MD;  Location: [place] CV LAB;  Service: Cardiovascular;  Laterality: N/A;  . JOINT REPLACEMENT    . RIGHT/LEFT HEART CATH AND CORONARY ANGIOGRAPHY  N/A 02/01/2018   Procedure: RIGHT/LEFT HEART CATH AND CORONARY ANGIOGRAPHY;  Surgeon: Nelva Bush, MD;  Location: [place] CV LAB;  Service: Cardiovascular;  Laterality: N/A;  . SPINAL CORD STIMULATOR INSERTION N/A 02/18/2016   Procedure: LUMBAR SPINAL CORD STIMULATOR INSERTION;  Surgeon: Clydell Hakim, MD;  Location: [place] NEURO ORS;  Service: Neurosurgery;  Laterality: N/A;  LUMBAR SPINAL CORD STIMULATOR INSERTION  . TOTAL KNEE ARTHROPLASTY Right 08/07/2012   Procedure: TOTAL KNEE ARTHROPLASTY;  Surgeon: Kerin Salen, MD;  Location: [place];  Service: Orthopedics;  Laterality: Right;  . TRANSURETHRAL RESECTION OF BLADDER TUMOR N/A 05/04/2017   Procedure: TRANSURETHRAL RESECTION OF BLADDER TUMOR (TURBT)/ INTRAVESICAL CHEMOTHERAPY INSTILLATION;  Surgeon: Kathie Rhodes, MD;  Location: [facility] ORS;  Service: Urology;  Laterality: N/A;    Current Outpatient Medications  Medication Sig Dispense Refill  . amLODipine (NORVASC) 5 MG tablet Take 5 mg by mouth daily.     . clopidogrel (PLAVIX) 75 MG tablet Take 75 mg by mouth daily.    Marland Kitchen doxazosin (CARDURA) 4 MG tablet Take 4 mg by mouth at bedtime.     . famotidine (PEPCID) 20 MG tablet TAKE 1 TABLET BY MOUTH ONCE A DAY 90 tablet 2  . Fluticasone-Salmeterol (ADVAIR) 250-50 MCG/DOSE AEPB Inhale 1 puff into the lungs 2 (two) times daily.    . metoprolol succinate (TOPROL-XL) 50 MG 24 hr tablet TAKE 3 TABLETS (150 MG) BY MOUTH DAILY AFTER SUPPER. TAKE WITH OR IMMEDIATELY FOLLOWING A MEAL. 270 tablet 3  . Nutritional Supplements (JUICE PLUS FIBRE PO) Take 6 tablets by mouth daily. 2 nuts tablets, 2 fruit tablets, and 2 veggie tablets    . potassium chloride (MICRO-K) 10 MEQ CR capsule Take 10 mEq by mouth daily.     Marland Kitchen PROAIR HFA 108 (90 Base) MCG/ACT inhaler Inhale 2 puffs into the lungs every 4 (four) hours as needed for shortness of breath or wheezing.     . solifenacin (VESICARE) 10 MG tablet Take 10 mg by mouth daily.    . tamsulosin (FLOMAX) 0.4 MG CAPS  capsule Take 1 capsule (0.4 mg total) by mouth daily after supper. 30 capsule   . torsemide (DEMADEX) 20 MG tablet Take 1 tablet (20 mg total) by mouth daily. 90 tablet 3  . vitamin B-12 (CYANOCOBALAMIN) 100 MCG tablet Take 100 mcg by mouth daily.    [name] 625 MG tablet Take 1,250 mg by mouth 2 (two) times daily with a meal.     . XARELTO 20 MG TABS tablet TAKE 1 TABLET BY MOUTH ONCE DAILY WITH SUPPER 30 tablet 5   No current facility-administered medications for this visit.    Allergies  Allergen Reactions  . Crestor [Rosuvastatin Calcium] Other (See Comments)    Muscle aches   . Pravastatin Other (See Comments)    Muscle aches   . Simvastatin Other (See Comments)    Muscle aches     Social History   Socioeconomic History  .  Marital status: Married    Spouse name: Not on file  . Number of children: 2  . Years of education: Not on file  . Highest education level: Not on file  Occupational History  . Occupation: Probation officer [employer]  Tobacco Use  . Smoking status: Former Smoker    Packs/day: 1.00    Years: 20.00    Pack years: 20.00    Types: Cigarettes    Quit date: 05/22/1977    Years since quitting: 42.2  . Smokeless tobacco: Never Used  . Tobacco comment: Quit 1978  Substance and Sexual Activity  . Alcohol use: No  . Drug use: No  . Sexual activity: Not on file  Other Topics Concern  . Not on file  Social History Narrative   Retired. Married. Regularly exercises.    Social Determinants of Health   Financial Resource Strain:   . Difficulty of Paying Living Expenses: Not on file  Food Insecurity:   . Worried About Charity fundraiser in the Last Year: Not on file  . Ran Out of Food in the Last Year: Not on file  Transportation Needs:   . Lack of Transportation (Medical): Not on file  . Lack of Transportation (Non-Medical): Not on file  Physical Activity:   . Days of Exercise per Week: Not on file  . Minutes of Exercise per Session: Not on  file  Stress:   . Feeling of Stress : Not on file  Social Connections:   . Frequency of Communication with Friends and Family: Not on file  . Frequency of Social Gatherings with Friends and Family: Not on file  . Attends Religious Services: Not on file  . Active Member of Clubs or Organizations: Not on file  . Attends Archivist Meetings: Not on file  . Marital Status: Not on file  Intimate Partner Violence:   . Fear of Current or Ex-Partner: Not on file  . Emotionally Abused: Not on file  . Physically Abused: Not on file  . Sexually Abused: Not on file    Family History  Problem Relation Age of Onset  . Cancer Other   . Heart attack Brother 36       had been on O2 for 10 years before this - COPD/tobacco    Review of Systems:  As stated in the HPI and otherwise negative.   BP 130/68   Pulse 78   Ht 5\' 11"  (1.803 m)   Wt 277 lb 9.6 oz (125.9 kg)   SpO2 97%   BMI 38.72 kg/m   Physical Examination:  General: Well developed, well nourished, NAD  HEENT: OP clear, mucus membranes moist  SKIN: warm, dry. No rashes. Neuro: No focal deficits  Musculoskeletal: Muscle strength 5/5 all ext  Psychiatric: Mood and affect normal  Neck: No JVD, no carotid bruits, no thyromegaly, no lymphadenopathy.  Lungs:Clear bilaterally, no wheezes, rhonci, crackles Cardiovascular: Regular rate and rhythm. No murmurs, gallops or rubs. Abdomen:Soft. Bowel sounds present. Non-tender.  Extremities: No lower extremity edema. Pulses are 2 + in the bilateral DP/PT.  Echo 01/29/18: Left ventricle: The cavity size was normal. Systolic function was   mildly to moderately reduced. The estimated ejection fraction was   in the range of 40% to 45%. Diffuse hypokinesis. There is   akinesis of the inferior myocardium. - Ventricular septum: The contour showed diastolic flattening. - Aortic valve: Valve mobility was restricted. There was trivial   regurgitation. - Aorta: Ascending aortic diameter:  41  mm (S). - Ascending aorta: The ascending aorta was mildly dilated. - Mitral valve: Calcified annulus. There was mild regurgitation. - Right ventricle: The cavity size was severely dilated. Wall   thickness was normal. Systolic function was moderately reduced. - Right atrium: The atrium was severely dilated. - Pulmonary arteries: Systolic pressure was moderately increased.   PA peak pressure: 60 mm Hg (S).  EKG:  EKG is not ordered today. The ekg ordered today demonstrates  Recent Labs: No results found for requested labs within last 8760 hours.   Lipid Panel    Component Value Date/Time   CHOL 140 01/31/2018 0303   TRIG 34 01/31/2018 0303   HDL 41 01/31/2018 0303   CHOLHDL 3.4 01/31/2018 0303   VLDL 7 01/31/2018 0303   LDLCALC 92 01/31/2018 0303     Wt Readings from Last 3 Encounters:  08/04/19 277 lb 9.6 oz (125.9 kg)  01/14/19 275 lb 9.6 oz (125 kg)  10/22/18 260 lb (117.9 kg)     Other studies Reviewed: Additional studies/ records that were reviewed today include: . Review of the above records demonstrates:    Assessment and Plan:   1. Atrial fibrillation, persistent: Rate controlled atrial fib today. Continue beta blocker and Xarelto.  2.HTN: BP is controlled.   3. Chronic systolic and diastolic CHF: When he was hospitalized in August 2019 there was mention of possible restrictive cardiomyopathy. He had mostly right sided heart failure symptoms though LVEF reduced to 40-45%.  He has inferior akinesis and overall hypokinesis.  PASP was 60 mmHg on echo 01/29/18.  He was found to have equalization of end-diastolic pressures on left and right heart catheterization and severe pulmonary hypertension concerning for restrictive cardiomyopathy.  His right-sided heart failure is thought to be contributed to by obesity hypoventilation/OSA. -His weight is stable. Continue torsemide.    4.  CAD without angina: No chest pain. Continue Plavix and beta blocker. He is intolerant to  multiple statins. He is on Welchol.   5. AAA: This was found on a CTA done 12/20/17 in the ED given c/o abd pain. This is being followed by Dr. Donnetta Hutching.   6. Ischemic cardiomyopathy: Will continue the beta blocker. No Ace-inh or ARB given chronic kidney disease.   7. Sleep apnea: He is followed by Dr. Radford Pax. Continue CPAP  Current medicines are reviewed at length with the patient today.  The patient does not have concerns regarding medicines.  The following changes have been made:  no change  Labs/ tests ordered today include:   No orders of the defined types were placed in this encounter.   Disposition:   FU with me in 6  months  Signed, Lauree Chandler, MD 08/04/2019 3:46 PM    [facility] [address], [place], [place]  [address] Phone: [phone]; Fax: [phone]

## 2019-08-04 NOTE — Patient Instructions (Signed)

## 2019-09-07 IMAGING — DX DG CHEST 2V
3 series · 3 of 3 positions shown · non-contrast
Comparison: Chest x-ray dated 05/21/2012 and abdominal CT scan
dated 12/20/2017

CLINICAL DATA: Shortness of breath.  History of COPD.

EXAM:
CHEST - 2 VIEW

[w chest pa]
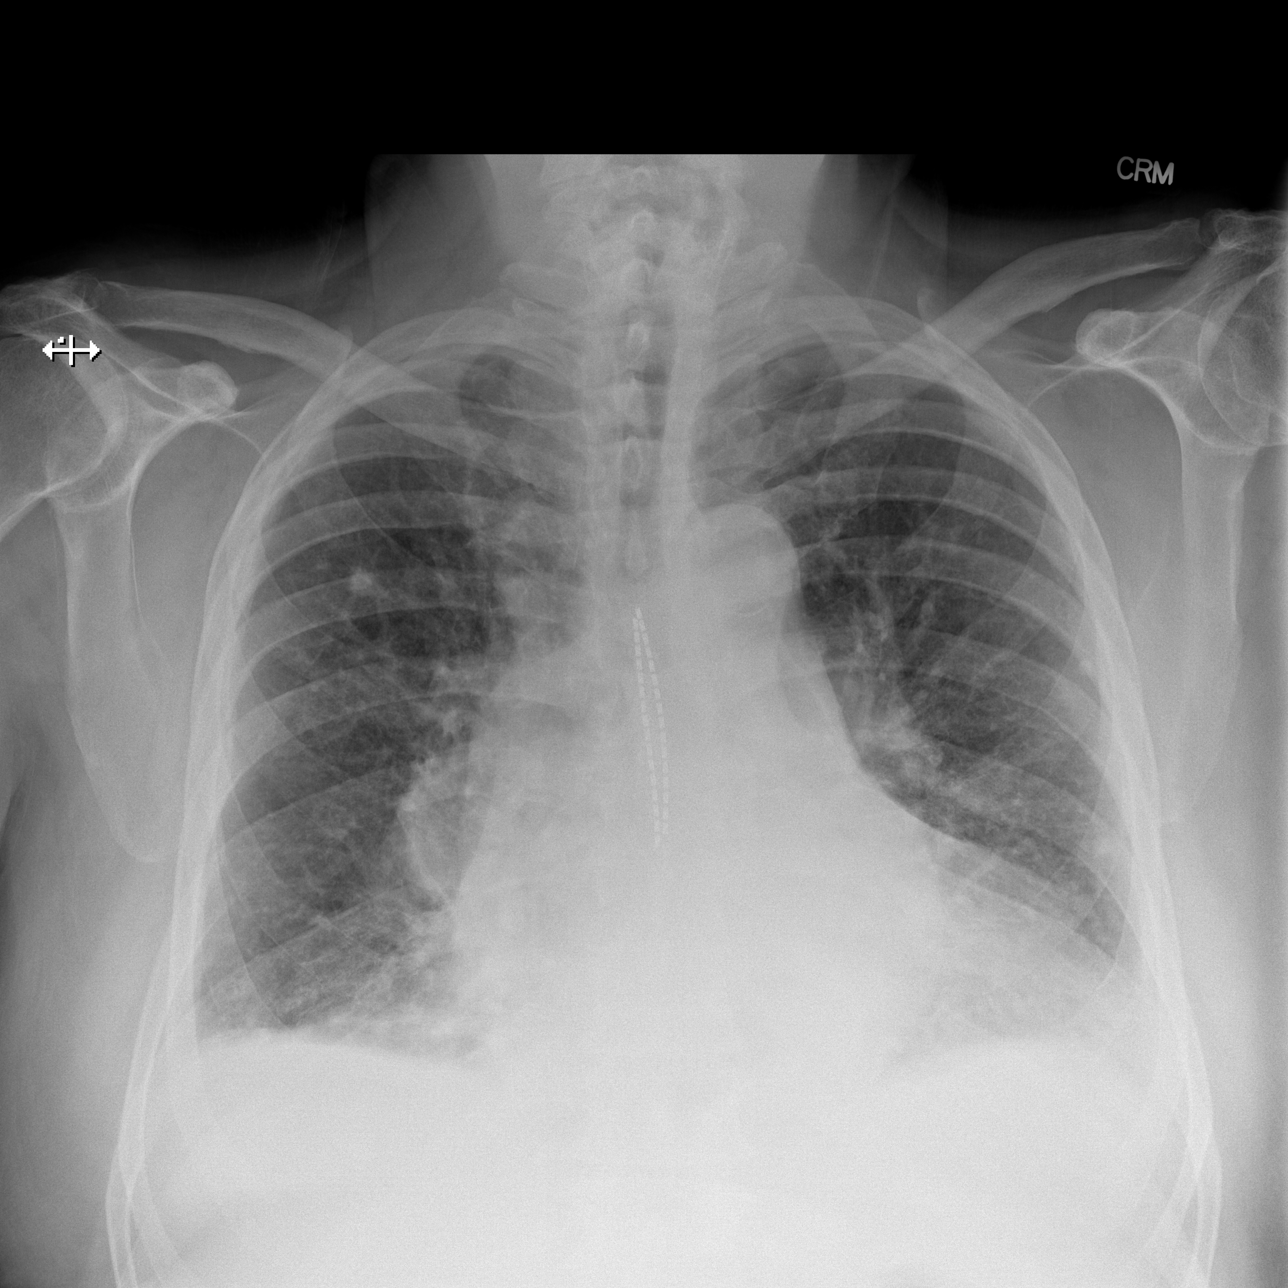

[w chest lat]
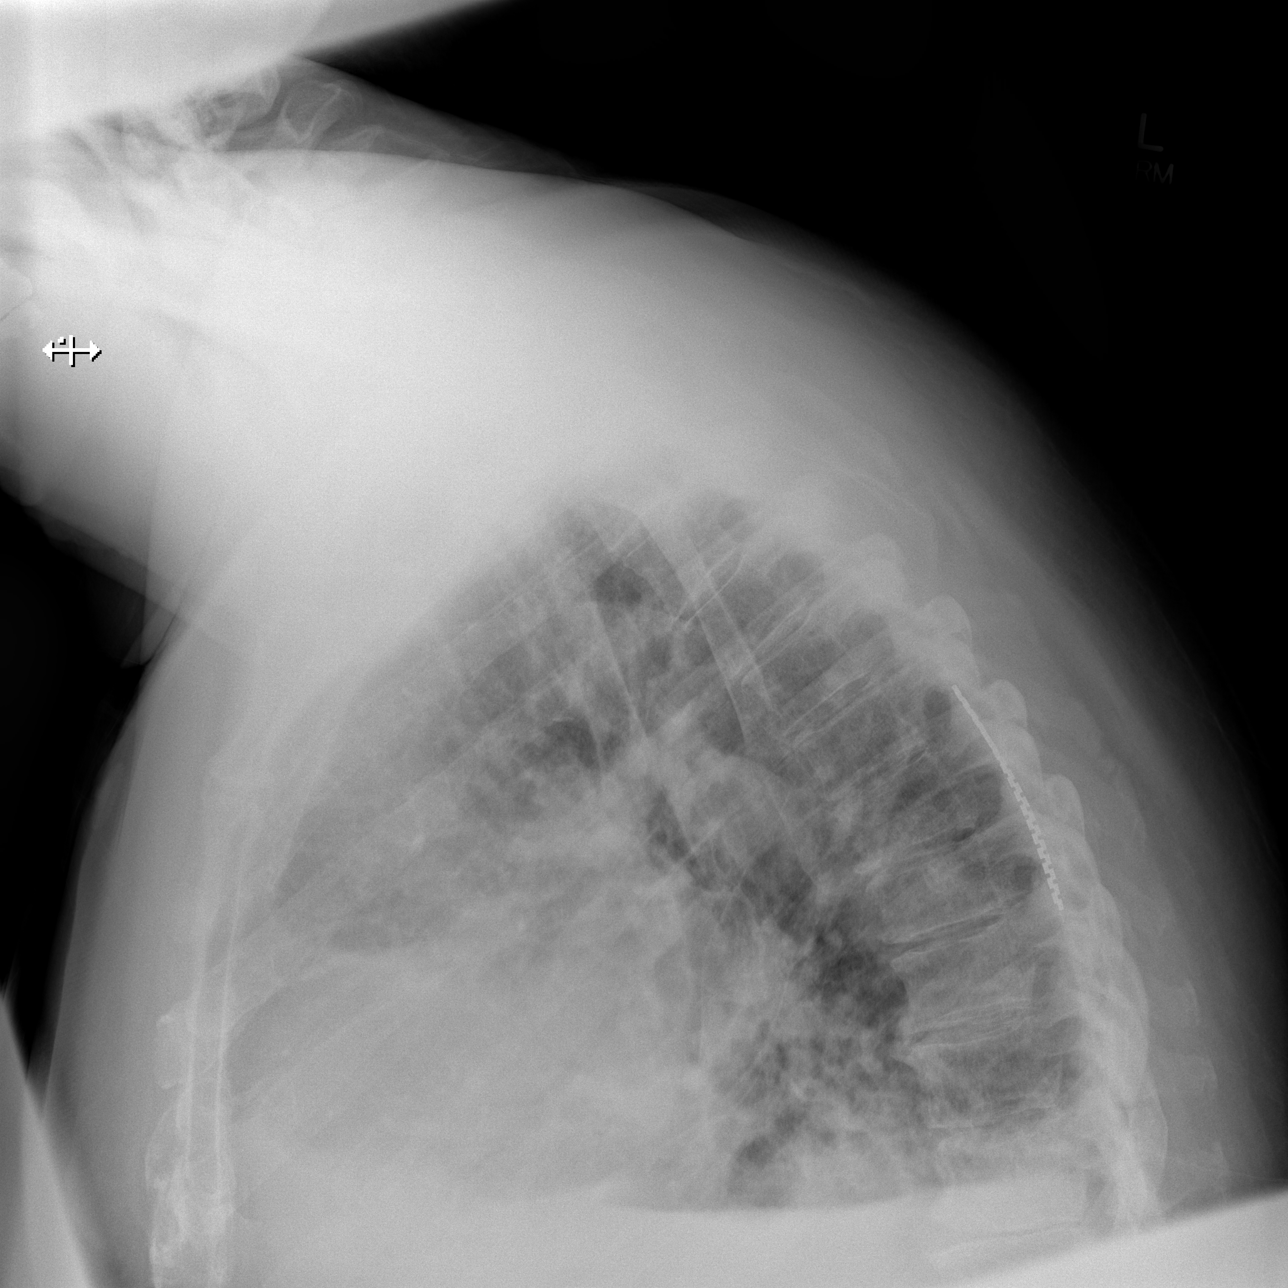

[w chest decub]
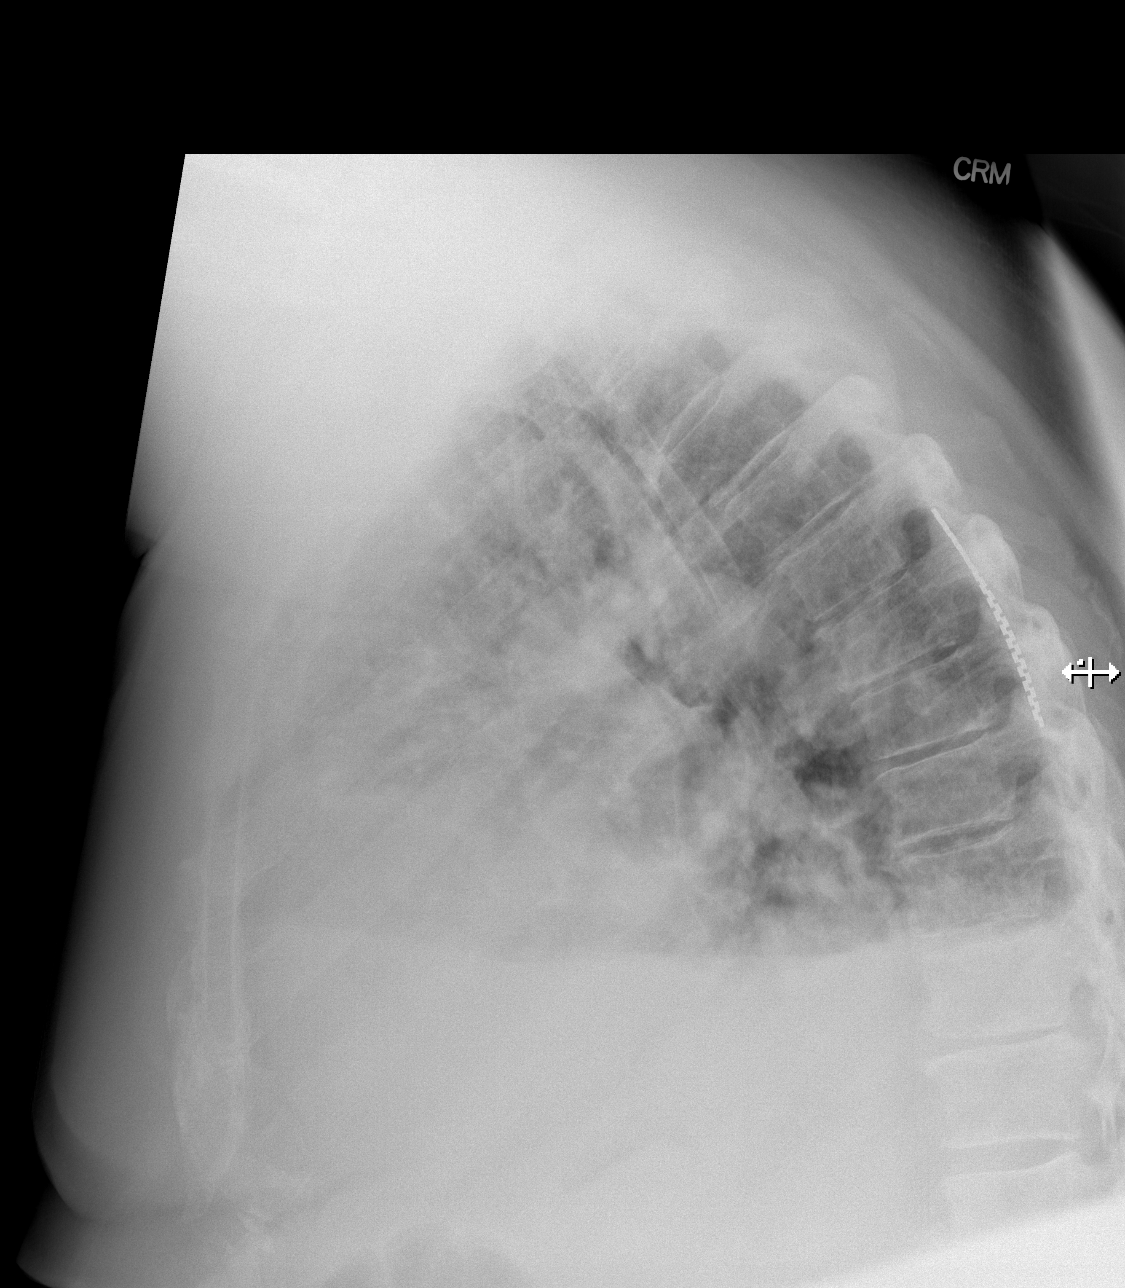

[3 of 3 positions shown; findings below may reference images not displayed]

FINDINGS: There is new cardiomegaly with small bilateral pleural effusions.
There is slight new pulmonary vascular prominence. Bilateral
calcified granulomas. Tortuosity and calcification of the thoracic
aorta.

Neurostimulator wires in the midthoracic spine. No significant bone
abnormality.

Moderate hiatal hernia
IMPRESSION: Findings consistent with congestive heart failure with new
cardiomegaly. Pulmonary vascular congestion, and small pleural
effusions. No discrete pulmonary edema.

## 2019-09-09 IMAGING — CT CT HEAD W/O CM
3 series · 16 of 47 positions shown, 19 images · non-contrast
Comparison: None.

CLINICAL DATA: Brief LEFT visual field cut 6 weeks ago. Evaluate
amaurosis fugax. History of carotid artery disease, hypertension,
hyperlipidemia.

EXAM:
CT HEAD WITHOUT CONTRAST
TECHNIQUE: Contiguous axial images were obtained from the base of the skull
through the vertex without intravenous contrast.

[Series 3: head 5.0 h30s · axial · 0.44mm/px · z∈[-143,+17]mm · 10 of 38 slices shown, 13 images]
[im 3/38  brain]
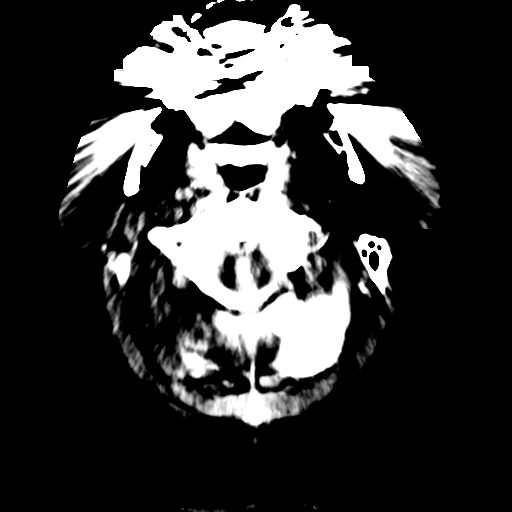
[im 3/38  bone]
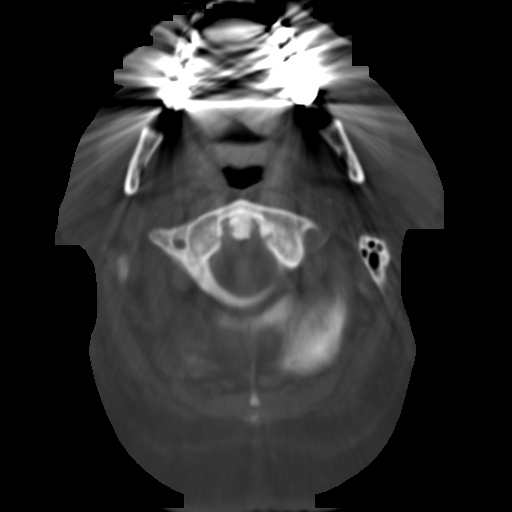
[im 7/38  brain]
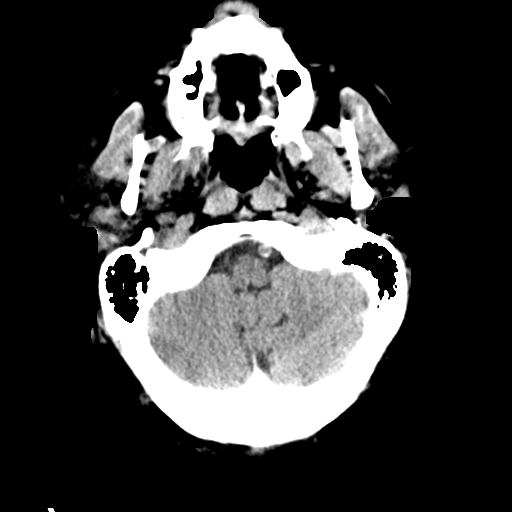
[im 11/38  brain]
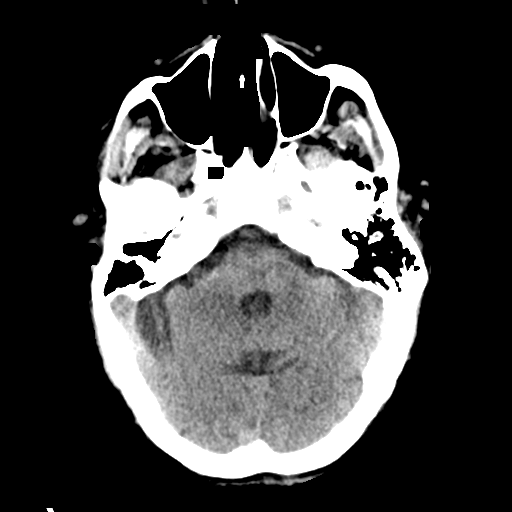
[im 13/38  brain]
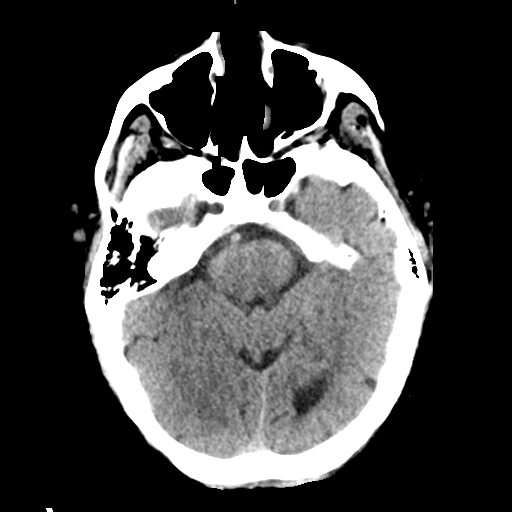
[im 17/38  brain]
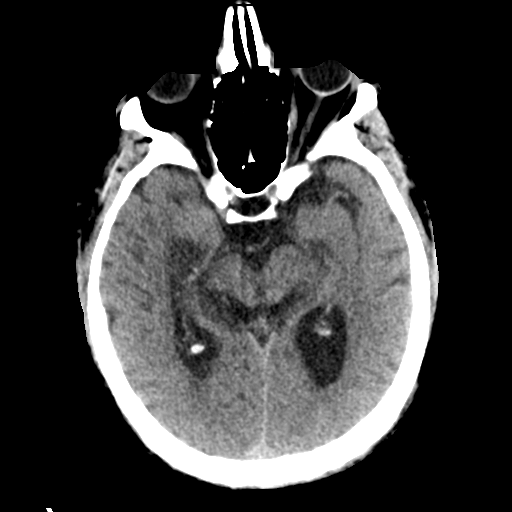
[im 17/38  bone]
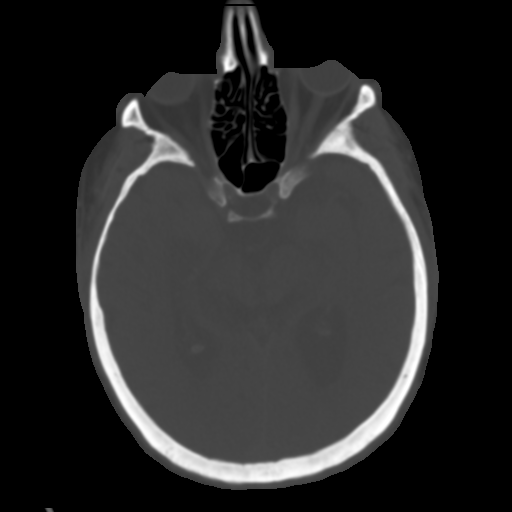
[im 21/38  brain]
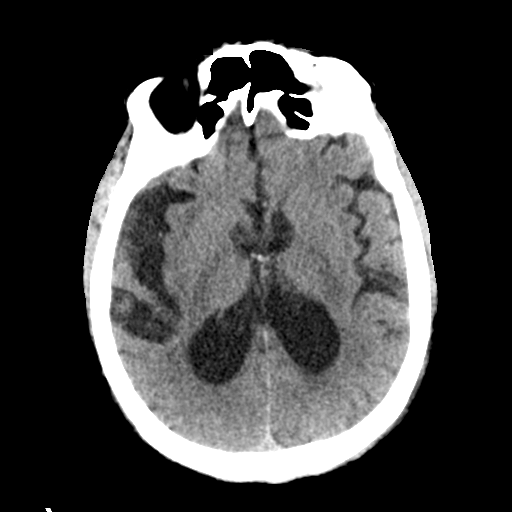
[im 25/38  brain]
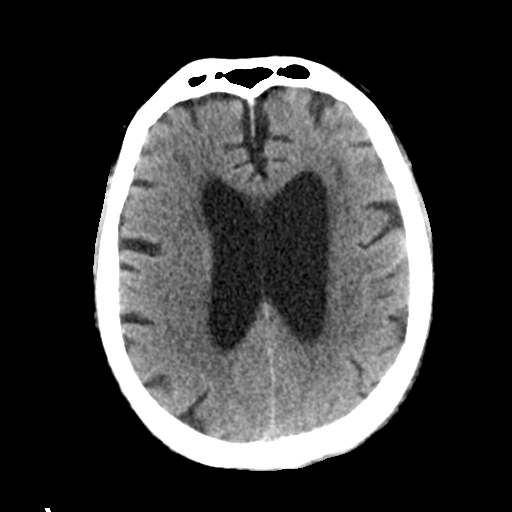
[im 29/38  brain]
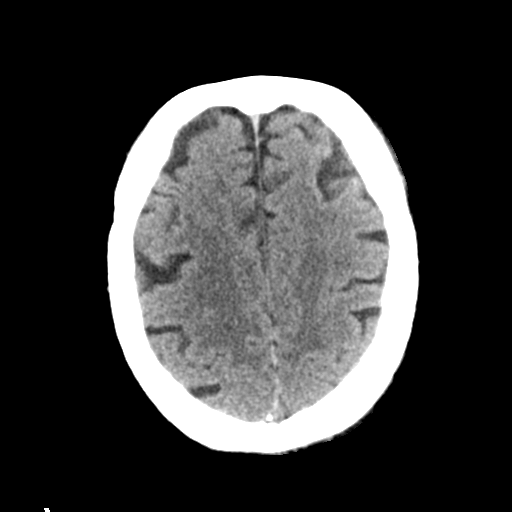
[im 31/38  brain]
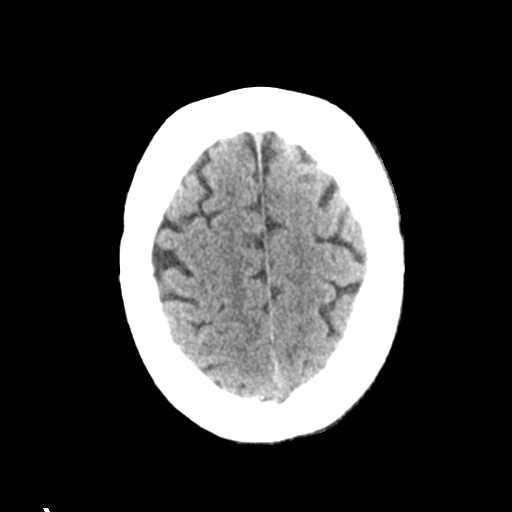
[im 31/38  bone]
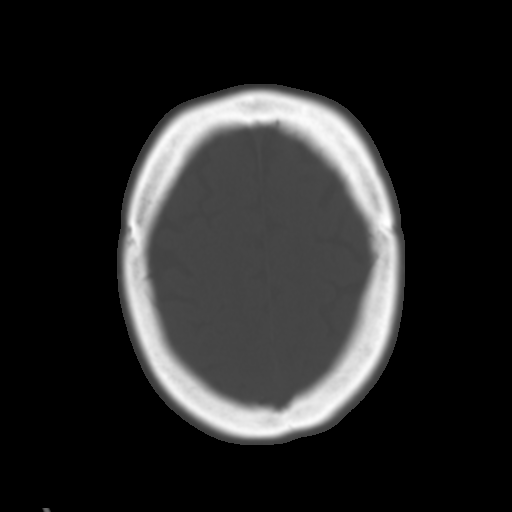
[im 35/38  brain]
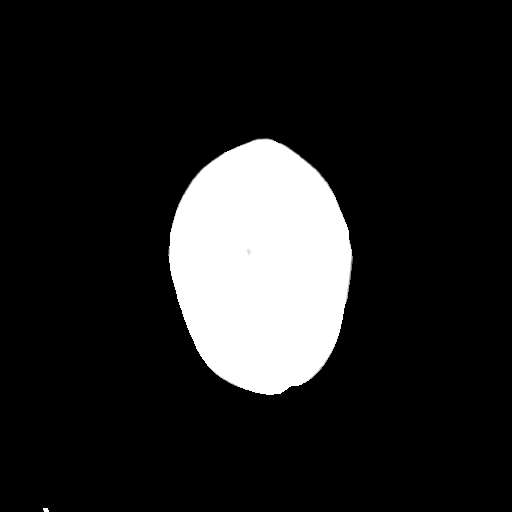

[Series 5: head 3.0 mpr cor · coronal · 0.36mm/px · 3 of 75 slices shown]
[im 25/75  brain]
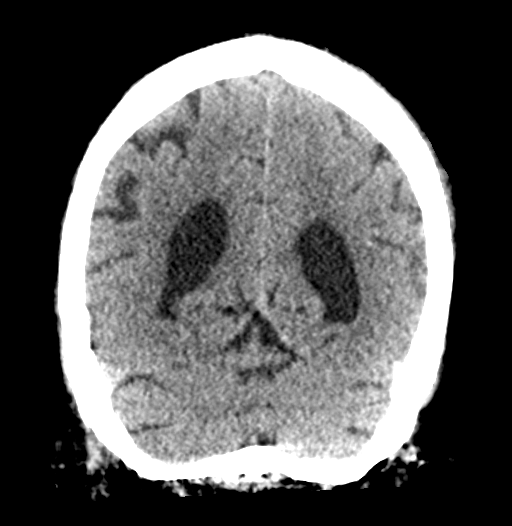
[im 33/75  brain]
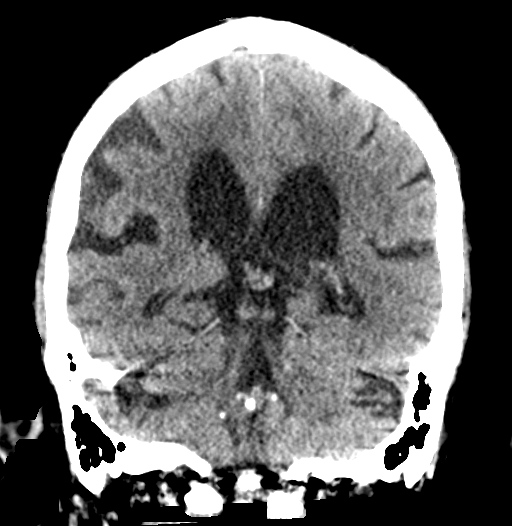
[im 42/75  brain]
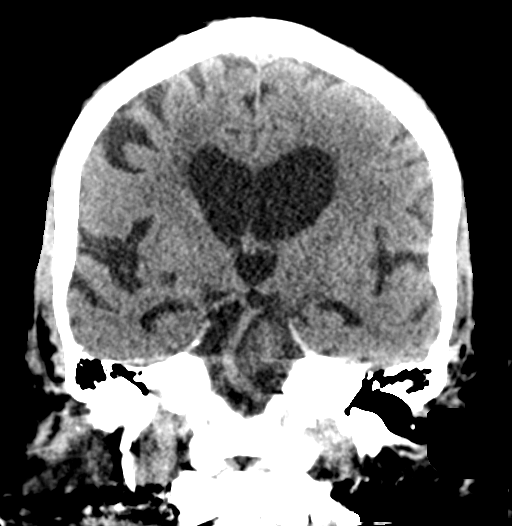

[Series 6: head 3.0 mpr sag · sagittal · 0.39mm/px · 3 of 57 slices shown]
[im 19/57  brain]
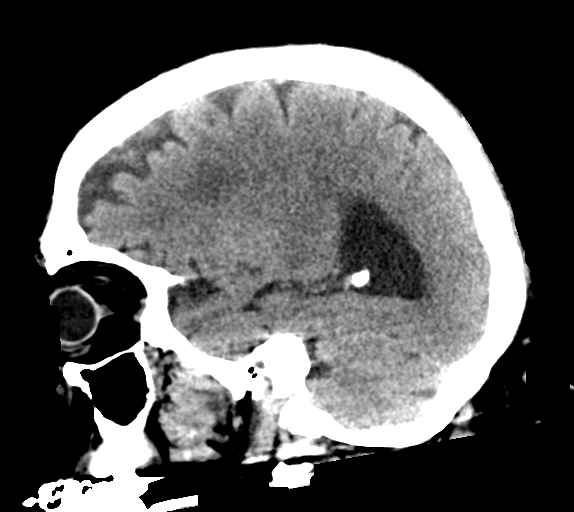
[im 29/57  brain]
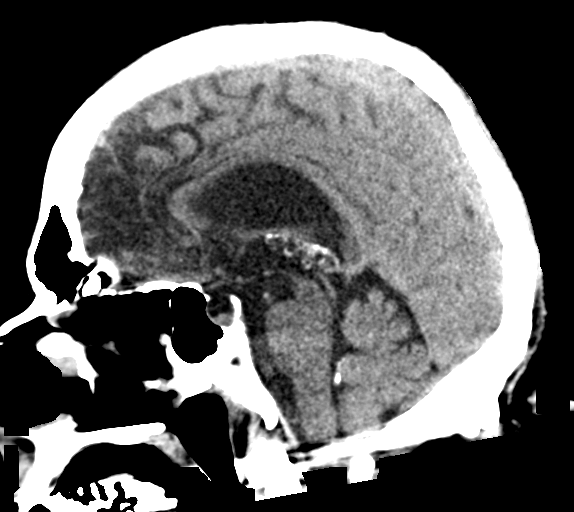
[im 38/57  brain]
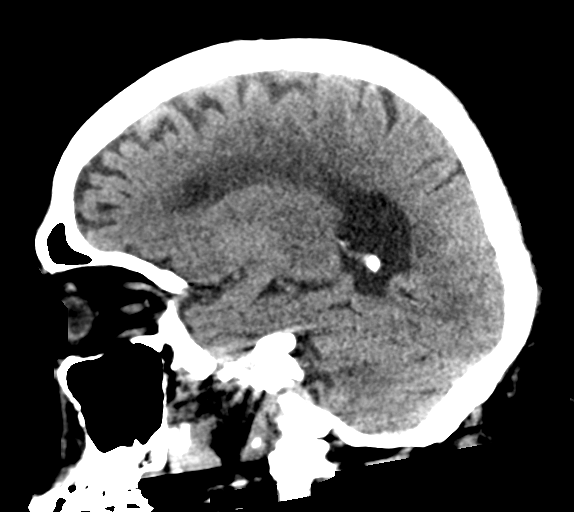

[16 of 47 positions shown; findings below may reference images not displayed]

FINDINGS: BRAIN: No intraparenchymal hemorrhage, mass effect nor midline
shift. Moderate to severe global parenchymal brain volume loss,
moderate lobulated ventricular margin with mild sulcal effacement at
the convexities.. No hydrocephalus. Patchy supratentorial white
matter hypodensities within normal range for patient's age, though
non-specific are most compatible with chronic small vessel ischemic
disease. No acute large vascular territory infarcts. No abnormal
extra-axial fluid collections. Basal cisterns are patent.

VASCULAR: Mild calcific atherosclerosis of the carotid siphons.

SKULL: No skull fracture. No significant scalp soft tissue swelling.

SINUSES/ORBITS: Trace paranasal sinus mucosal thickening.
Subcentimeter frontoethmoidal osteomas. Mastoid air cells are well
aerated.The included ocular globes and orbital contents are
non-suspicious. Status post bilateral ocular lens implants.

OTHER: None.
IMPRESSION: 1. No acute intracranial process.
2. Moderate very to severe parenchymal brain volume loss with
suspected component of normal pressure hydrocephalus.
3. Moderate chronic small vessel ischemic changes.

## 2019-12-11 ENCOUNTER — Telehealth: Payer: Self-pay | Admitting: Cardiology

## 2019-12-11 NOTE — Telephone Encounter (Signed)
1) What problem are you experiencing?   2) Who is your medical equipment company? [employer] [facility] [employer]  Patient states they have faxed over 3 times equipment auth to get a CPAP machine for him.    Please route to the sleep study assistant.

## 2019-12-16 NOTE — Telephone Encounter (Signed)
Reached out to [facility] to resolve patients problem and all paperwork has been received and patient has received his supplies.

## 2020-01-05 ENCOUNTER — Other Ambulatory Visit: Payer: Self-pay | Admitting: Cardiovascular Disease

## 2020-01-05 NOTE — Telephone Encounter (Signed)
Xarelto 20mg  refill request received. Pt is 84 years old, weight-125.9kg, Crea-1.74 on 11/04/2019 via [facility] from scanned labs, last seen by Dr. Angelena Form on 08/04/2019, Diagnosis-Afib, CrCl-59.30ml/min; Dose is appropriate based on dosing criteria. Will send in refill to requested pharmacy.

## 2020-03-12 ENCOUNTER — Telehealth: Payer: Self-pay | Admitting: Cardiology

## 2020-03-12 NOTE — Telephone Encounter (Signed)
Agree. Would start with primary care. Thanks, chris

## 2020-03-12 NOTE — Telephone Encounter (Signed)
Pt c/o Shortness Of Breath: STAT if SOB developed within the last 24 hours or pt is noticeably SOB on the phone  1. Are you currently SOB (can you hear that pt is SOB on the phone)? No  2. How long have you been experiencing SOB? No  3. Are you SOB when sitting or when up moving around? When moving up  4. Are you currently experiencing any other symptoms? No; get like a brain freeze in chest; coughing up a lot of mucus; has COPD

## 2020-03-12 NOTE — Telephone Encounter (Signed)
Patient notified

## 2020-03-12 NOTE — Telephone Encounter (Signed)
I spoke with patient. He reports for the last 2 weeks he has had congestion.  Thought it was allergies. Was coughing up clear mucous but it is now dark yellow.  He states in the past his PCP gave him an inhaler which he has been using.  No fever.  Has had covid vaccine.  Also recent shortness of breath with exertion such as walking to the door.  He rests for 30 seconds and shortness of breath improves. No chest pain. Weight stable.  Goes up and down a pound at times. No swelling. Taking torsemide as ordered I advised him to contact PCP due to discolored mucous and congestion. Will forward to Dr Angelena Form for review/recommendations

## 2020-03-30 ENCOUNTER — Ambulatory Visit
Admission: RE | Admit: 2020-03-30 | Discharge: 2020-03-30 | Disposition: A | Discharge status: Home / Self Care | Payer: Medicare Other | Source: Ambulatory Visit | Attending: Family Medicine | Admitting: Family Medicine

## 2020-03-30 ENCOUNTER — Other Ambulatory Visit: Payer: Self-pay | Admitting: Family Medicine

## 2020-03-30 DIAGNOSIS — I509 Heart failure, unspecified: Secondary | ICD-10-CM

## 2020-04-03 ENCOUNTER — Other Ambulatory Visit: Payer: Self-pay | Admitting: Cardiovascular Disease

## 2020-05-26 DEATH — deceased
